# Patient Record
Sex: Female | Born: 1955 | Race: Black or African American | Hispanic: No | Smoking: Current some day smoker
Health system: Southern US, Community
[De-identification: ages and names within clinical notes are randomized; demographics above are authoritative.]

## PROBLEM LIST (undated history)

## (undated) DIAGNOSIS — M25569 Pain in unspecified knee: Secondary | ICD-10-CM

## (undated) DIAGNOSIS — E669 Obesity, unspecified: Secondary | ICD-10-CM

## (undated) DIAGNOSIS — M549 Dorsalgia, unspecified: Secondary | ICD-10-CM

## (undated) DIAGNOSIS — Z972 Presence of dental prosthetic device (complete) (partial): Secondary | ICD-10-CM

## (undated) DIAGNOSIS — G43909 Migraine, unspecified, not intractable, without status migrainosus: Secondary | ICD-10-CM

## (undated) DIAGNOSIS — R0683 Snoring: Secondary | ICD-10-CM

## (undated) DIAGNOSIS — E119 Type 2 diabetes mellitus without complications: Secondary | ICD-10-CM

## (undated) DIAGNOSIS — E1169 Type 2 diabetes mellitus with other specified complication: Secondary | ICD-10-CM

## (undated) DIAGNOSIS — I8390 Asymptomatic varicose veins of unspecified lower extremity: Secondary | ICD-10-CM

## (undated) DIAGNOSIS — E785 Hyperlipidemia, unspecified: Secondary | ICD-10-CM

## (undated) DIAGNOSIS — M170 Bilateral primary osteoarthritis of knee: Secondary | ICD-10-CM

## (undated) DIAGNOSIS — R7303 Prediabetes: Secondary | ICD-10-CM

## (undated) DIAGNOSIS — Z973 Presence of spectacles and contact lenses: Secondary | ICD-10-CM

## (undated) DIAGNOSIS — F419 Anxiety disorder, unspecified: Secondary | ICD-10-CM

## (undated) DIAGNOSIS — I10 Essential (primary) hypertension: Secondary | ICD-10-CM

## (undated) DIAGNOSIS — F32A Depression, unspecified: Secondary | ICD-10-CM

## (undated) HISTORY — DX: Bilateral primary osteoarthritis of knee: M17.0

## (undated) HISTORY — DX: Type 2 diabetes mellitus with other specified complication: E66.9

## (undated) HISTORY — DX: Hyperlipidemia, unspecified: E78.5

## (undated) HISTORY — DX: Essential (primary) hypertension: I10

## (undated) HISTORY — DX: Anxiety disorder, unspecified: F41.9

## (undated) HISTORY — DX: Snoring: R06.83

## (undated) HISTORY — DX: Asymptomatic varicose veins of unspecified lower extremity: I83.90

## (undated) HISTORY — DX: Pain in unspecified knee: M25.569

## (undated) HISTORY — DX: Depression, unspecified: F32.A

## (undated) HISTORY — DX: Type 2 diabetes mellitus with other specified complication: E11.69

## (undated) HISTORY — DX: Dorsalgia, unspecified: M54.9

## (undated) HISTORY — DX: Migraine, unspecified, not intractable, without status migrainosus: G43.909

## (undated) HISTORY — PX: FRACTURE SURGERY: SHX138

## (undated) HISTORY — PX: TUBAL LIGATION: SHX77

## (undated) HISTORY — PX: COLONOSCOPY: SHX174

## (undated) HISTORY — DX: Type 2 diabetes mellitus without complications: E11.9

## (undated) HISTORY — PX: DILATION AND CURETTAGE OF UTERUS: SHX78

## (undated) NOTE — *Deleted (*Deleted)
It was great to see you again today!  

---

## 1998-06-27 HISTORY — PX: FRACTURE SURGERY: SHX138

## 2003-04-22 ENCOUNTER — Ambulatory Visit (HOSPITAL_COMMUNITY): Admission: RE | Admit: 2003-04-22 | Discharge: 2003-04-22 | Payer: Self-pay

## 2005-08-02 ENCOUNTER — Ambulatory Visit (HOSPITAL_COMMUNITY): Admission: RE | Admit: 2005-08-02 | Discharge: 2005-08-02 | Payer: Self-pay | Admitting: Obstetrics

## 2007-10-25 ENCOUNTER — Ambulatory Visit (HOSPITAL_COMMUNITY): Admission: RE | Admit: 2007-10-25 | Discharge: 2007-10-25 | Payer: Self-pay | Admitting: Obstetrics

## 2008-06-27 DIAGNOSIS — I10 Essential (primary) hypertension: Secondary | ICD-10-CM | POA: Insufficient documentation

## 2008-06-27 HISTORY — DX: Essential (primary) hypertension: I10

## 2008-07-03 ENCOUNTER — Encounter: Admission: RE | Admit: 2008-07-03 | Discharge: 2008-07-03 | Payer: Self-pay | Admitting: Family Medicine

## 2008-07-22 ENCOUNTER — Encounter: Admission: RE | Admit: 2008-07-22 | Discharge: 2008-07-22 | Payer: Self-pay | Admitting: Family Medicine

## 2009-04-27 ENCOUNTER — Encounter: Admission: RE | Admit: 2009-04-27 | Discharge: 2009-06-24 | Payer: Self-pay | Admitting: Family Medicine

## 2009-08-06 ENCOUNTER — Ambulatory Visit (HOSPITAL_COMMUNITY): Admission: RE | Admit: 2009-08-06 | Discharge: 2009-08-06 | Payer: Self-pay | Admitting: Obstetrics

## 2010-06-23 ENCOUNTER — Ambulatory Visit (HOSPITAL_COMMUNITY)
Admission: RE | Admit: 2010-06-23 | Discharge: 2010-06-23 | Payer: Self-pay | Source: Home / Self Care | Attending: Obstetrics | Admitting: Obstetrics

## 2010-07-17 ENCOUNTER — Encounter: Payer: Self-pay | Admitting: Obstetrics

## 2010-07-18 ENCOUNTER — Encounter: Payer: Self-pay | Admitting: Obstetrics

## 2010-08-06 ENCOUNTER — Other Ambulatory Visit (HOSPITAL_COMMUNITY): Payer: Self-pay

## 2010-08-25 ENCOUNTER — Encounter (HOSPITAL_COMMUNITY)
Admission: RE | Admit: 2010-08-25 | Discharge: 2010-08-25 | Disposition: A | Discharge status: Home / Self Care | Payer: BC Managed Care – PPO | Source: Ambulatory Visit | Attending: Obstetrics | Admitting: Obstetrics

## 2010-08-25 LAB — CBC
HCT: 41.3 % (ref 36.0–46.0)
Hemoglobin: 13.6 g/dL (ref 12.0–15.0)
MCH: 27.8 pg (ref 26.0–34.0)
MCHC: 32.9 g/dL (ref 30.0–36.0)
MCV: 84.3 fL (ref 78.0–100.0)
Platelets: 347 10*3/uL (ref 150–400)
RBC: 4.9 MIL/uL (ref 3.87–5.11)
RDW: 14.5 % (ref 11.5–15.5)
WBC: 9.9 10*3/uL (ref 4.0–10.5)

## 2010-08-25 LAB — BASIC METABOLIC PANEL
BUN: 11 mg/dL (ref 6–23)
CO2: 28 mEq/L (ref 19–32)
Calcium: 9.5 mg/dL (ref 8.4–10.5)
Chloride: 103 mEq/L (ref 96–112)
Creatinine, Ser: 0.75 mg/dL (ref 0.4–1.2)
GFR calc Af Amer: >60 mL/min (ref >60)
GFR calc non Af Amer: >60 mL/min (ref >60)
Glucose, Bld: 95 mg/dL (ref 70–99)
Potassium: 3.9 mEq/L (ref 3.5–5.1)
Sodium: 137 mEq/L (ref 135–145)

## 2010-08-26 HISTORY — PX: OTHER SURGICAL HISTORY: SHX169

## 2010-08-31 ENCOUNTER — Ambulatory Visit (HOSPITAL_COMMUNITY)
Admission: RE | Admit: 2010-08-31 | Discharge: 2010-08-31 | Disposition: A | Discharge status: Home / Self Care | Payer: BC Managed Care – PPO | Source: Ambulatory Visit | Attending: Obstetrics | Admitting: Obstetrics

## 2010-08-31 ENCOUNTER — Other Ambulatory Visit: Payer: Self-pay | Admitting: Obstetrics

## 2010-08-31 DIAGNOSIS — Z01810 Encounter for preprocedural cardiovascular examination: Secondary | ICD-10-CM | POA: Insufficient documentation

## 2010-08-31 DIAGNOSIS — R9431 Abnormal electrocardiogram [ECG] [EKG]: Secondary | ICD-10-CM | POA: Insufficient documentation

## 2010-08-31 DIAGNOSIS — N938 Other specified abnormal uterine and vaginal bleeding: Secondary | ICD-10-CM | POA: Insufficient documentation

## 2010-08-31 DIAGNOSIS — N949 Unspecified condition associated with female genital organs and menstrual cycle: Secondary | ICD-10-CM | POA: Insufficient documentation

## 2010-08-31 DIAGNOSIS — Z01812 Encounter for preprocedural laboratory examination: Secondary | ICD-10-CM | POA: Insufficient documentation

## 2010-08-31 DIAGNOSIS — E119 Type 2 diabetes mellitus without complications: Secondary | ICD-10-CM | POA: Insufficient documentation

## 2010-09-02 NOTE — Op Note (Signed)
  [name], Misty Bell           ACCOUNT NO.:  [id number]  MEDICAL RECORD NO.:  [id number]           PATIENT TYPE:  O  LOCATION:  [facility]                          [facility]  PHYSICIAN:  Kathreen Cosier, M.D.DATE OF BIRTH:  1955-08-06  DATE OF PROCEDURE:  08/31/2010 DATE OF DISCHARGE:                              OPERATIVE REPORT   PREOPERATIVE DIAGNOSIS:  Dysfunctional uterine bleeding.  POSTOPERATIVE DIAGNOSIS:  Dysfunctional uterine bleeding.  PROCEDURES:  Hysteroscopy, dilation and curettage, NovaSure ablation.  Under general anesthesia, the patient in lithotomy position, perineum and vagina prepped and draped.  Bladder emptied with straight catheter. Bimanual exam revealed uterus to be normal size.  Negative adnexa. Speculum placed in the vagina.  Cervix injected with 10 mL of 1% Xylocaine.  Anterior lip of the cervix grasped with tenaculum.  The endometrial cavity sounded at 11 cm.  Cervix length was measured at 6 cm.  Cavity length was 5 cm.  She had endocervical polyp which was removed.  Cervix was dilated with #25 Shawnie Pons.  Hysteroscope inserted. She had multiple polyps in the cavity.  Hysteroscope removed.  Sharp curettage done.  Large amount of tissue obtained.  The NovaSure device inserted in the cavity, integrity test passed.  Cavity width was 5 cm. Ablation was performed for 110 seconds at 140 watts.  Postablation hysteroscopy, total ablation of the cavity.  Procedure terminated.  The patient tolerated the procedure well, taken to recovery room in good condition.          ______________________________ Kathreen Cosier, M.D.     BAM/MEDQ  D:  08/31/2010  T:  08/31/2010  Job:  [id number]  Electronically Signed by Francoise Ceo M.D. on 09/01/2010 09:44:24 AM

## 2011-06-07 ENCOUNTER — Other Ambulatory Visit (HOSPITAL_COMMUNITY): Payer: Self-pay | Admitting: Obstetrics

## 2011-06-07 DIAGNOSIS — Z1231 Encounter for screening mammogram for malignant neoplasm of breast: Secondary | ICD-10-CM

## 2011-07-13 ENCOUNTER — Ambulatory Visit (HOSPITAL_COMMUNITY)
Admission: RE | Admit: 2011-07-13 | Discharge: 2011-07-13 | Disposition: A | Discharge status: Home / Self Care | Payer: BC Managed Care – PPO | Source: Ambulatory Visit | Attending: Obstetrics | Admitting: Obstetrics

## 2011-07-13 ENCOUNTER — Ambulatory Visit (HOSPITAL_COMMUNITY): Payer: BC Managed Care – PPO

## 2011-07-13 DIAGNOSIS — Z1231 Encounter for screening mammogram for malignant neoplasm of breast: Secondary | ICD-10-CM | POA: Insufficient documentation

## 2011-07-20 ENCOUNTER — Other Ambulatory Visit: Payer: Self-pay | Admitting: Obstetrics

## 2011-07-20 DIAGNOSIS — R928 Other abnormal and inconclusive findings on diagnostic imaging of breast: Secondary | ICD-10-CM

## 2011-07-27 ENCOUNTER — Ambulatory Visit
Admission: RE | Admit: 2011-07-27 | Discharge: 2011-07-27 | Disposition: A | Discharge status: Home / Self Care | Payer: BC Managed Care – PPO | Source: Ambulatory Visit | Attending: Obstetrics | Admitting: Obstetrics

## 2011-07-27 DIAGNOSIS — R928 Other abnormal and inconclusive findings on diagnostic imaging of breast: Secondary | ICD-10-CM

## 2011-08-03 ENCOUNTER — Encounter: Payer: Self-pay | Admitting: Gastroenterology

## 2011-08-15 ENCOUNTER — Ambulatory Visit (AMBULATORY_SURGERY_CENTER): Payer: BC Managed Care – PPO

## 2011-08-15 VITALS — Ht 63.0 in | Wt 230.0 lb

## 2011-08-15 DIAGNOSIS — Z1211 Encounter for screening for malignant neoplasm of colon: Secondary | ICD-10-CM

## 2011-08-15 MED ORDER — PEG-KCL-NACL-NASULF-NA ASC-C 100 G PO SOLR: 1.0000 | Freq: Once | ORAL | Status: DC

## 2011-08-25 ENCOUNTER — Ambulatory Visit (AMBULATORY_SURGERY_CENTER): Payer: BC Managed Care – PPO | Admitting: Gastroenterology

## 2011-08-25 ENCOUNTER — Encounter: Payer: Self-pay | Admitting: Gastroenterology

## 2011-08-25 VITALS — BP 119/84 | HR 89 | Temp 97.9°F | Resp 20 | Ht 63.0 in | Wt 230.0 lb

## 2011-08-25 DIAGNOSIS — D126 Benign neoplasm of colon, unspecified: Secondary | ICD-10-CM

## 2011-08-25 DIAGNOSIS — Z1211 Encounter for screening for malignant neoplasm of colon: Secondary | ICD-10-CM

## 2011-08-25 MED ORDER — SODIUM CHLORIDE 0.9 % IV SOLN: 500.0000 mL | INTRAVENOUS | Status: DC

## 2011-08-25 NOTE — Op Note (Signed)
[facility] [address] [place], [place]  [address]  COLONOSCOPY PROCEDURE REPORT  PATIENT:  Misty Bell, Misty Bell  MR#:  [id number] BIRTHDATE:  [birth year], 55 yrs. old  GENDER:  female ENDOSCOPIST:  Barbette Hair. Arlyce Dice, MD REF. BY:  Clyda Greener, M.D. PROCEDURE DATE:  08/25/2011 PROCEDURE:  Colonoscopy with snare polypectomy, Colon with cold biopsy polypectomy ASA CLASS:  Class II INDICATIONS:  Routine Risk Screening MEDICATIONS:   MAC sedation, administered by CRNA propofol 300mg IV  DESCRIPTION OF PROCEDURE:   After the risks benefits and alternatives of the procedure were thoroughly explained, informed consent was obtained.  Digital rectal exam was performed and revealed no abnormalities.   The LB 180AL [id number] endoscope was introduced through the anus and advanced to the cecum, which was identified by both the appendix and ileocecal valve, without limitations.  The quality of the prep was excellent, using MoviPrep.  The instrument was then slowly withdrawn as the colon was fully examined. <<PROCEDUREIMAGES>>  FINDINGS:  A sessile polyp was found in the distal transverse colon. It was 10 mm in size. Polyp was snared without cautery. Retrieval was successful (see image4). snare polyp  A sessile polyp was found in the descending colon. It was 4 mm in size. Polyp was snared without cautery. Retrieval was successful (see image6). snare polyp  A diminutive polyp was found in the sigmoid colon. It was 2 mm in size. It was found 26 cm from the point of entry. The polyp was removed using cold biopsy forceps (see image7).  This was otherwise a normal examination of the colon (see image2 and image8).   Retroflexed views in the rectum revealed no abnormalities.    The time to cecum =  1) 4.75 minutes. The scope was then withdrawn in  1) 12.0  minutes from the cecum and the procedure completed. COMPLICATIONS:  None ENDOSCOPIC IMPRESSION: 1) 10 mm sessile polyp in the distal  transverse colon 2) 4 mm sessile polyp in the descending colon 3) 2 mm diminutive polyp in the sigmoid colon 4) Otherwise normal examination RECOMMENDATIONS: 1) If the polyp(s) removed today are proven to be adenomatous (pre-cancerous) polyps, you will need a repeat colonoscopy in 5 years. Otherwise you should continue to follow colorectal cancer screening guidelines for "routine risk" patients with colonoscopy in 10 years. You will receive a letter within 1-2 weeks with the results of your biopsy as well as final recommendations. Please call my office if you have not received a letter after 3 weeks. REPEAT EXAM:  You will receive a letter from Dr. Arlyce Dice in 1-2 weeks, after reviewing the final pathology, with followup recommendations.  ______________________________ Barbette Hair Arlyce Dice, MD  CC:  n. eSIGNED:   Barbette Hair. Keane Martelli at 08/25/2011 11:09 AM  [place], [id number]

## 2011-08-25 NOTE — Patient Instructions (Signed)
Discharge instructions given with verbal understanding. Handout on polyps given. Resume previous medications. YOU HAD AN ENDOSCOPIC PROCEDURE TODAY AT THE [facility]: Refer to the procedure report that was given to you for any specific questions about what was found during the examination.  If the procedure report does not answer your questions, please call your gastroenterologist to clarify.  If you requested that your care partner not be given the details of your procedure findings, then the procedure report has been included in a sealed envelope for you to review at your convenience later.  YOU SHOULD EXPECT: Some feelings of bloating in the abdomen. Passage of more gas than usual.  Walking can help get rid of the air that was put into your GI tract during the procedure and reduce the bloating. If you had a lower endoscopy (such as a colonoscopy or flexible sigmoidoscopy) you may notice spotting of blood in your stool or on the toilet paper. If you underwent a bowel prep for your procedure, then you may not have a normal bowel movement for a few days.  DIET: Your first meal following the procedure should be a light meal and then it is ok to progress to your normal diet.  A half-sandwich or bowl of soup is an example of a good first meal.  Heavy or fried foods are harder to digest and may make you feel nauseous or bloated.  Likewise meals heavy in dairy and vegetables can cause extra gas to form and this can also increase the bloating.  Drink plenty of fluids but you should avoid alcoholic beverages for 24 hours.  ACTIVITY: Your care partner should take you home directly after the procedure.  You should plan to take it easy, moving slowly for the rest of the day.  You can resume normal activity the day after the procedure however you should NOT DRIVE or use heavy machinery for 24 hours (because of the sedation medicines used during the test).    SYMPTOMS TO REPORT IMMEDIATELY: A  gastroenterologist can be reached at any hour.  During normal business hours, 8:30 AM to 5:00 PM Monday through Friday, call [phone].  After hours and on weekends, please call the GI answering service at [phone] who will take a message and have the physician on call contact you.   Following lower endoscopy (colonoscopy or flexible sigmoidoscopy):  Excessive amounts of blood in the stool  Significant tenderness or worsening of abdominal pains  Swelling of the abdomen that is new, acute  Fever of 100F or higher  FOLLOW UP: If any biopsies were taken you will be contacted by phone or by letter within the next 1-3 weeks.  Call your gastroenterologist if you have not heard about the biopsies in 3 weeks.  Our staff will call the home number listed on your records the next business day following your procedure to check on you and address any questions or concerns that you may have at that time regarding the information given to you following your procedure. This is a courtesy call and so if there is no answer at the home number and we have not heard from you through the emergency physician on call, we will assume that you have returned to your regular daily activities without incident.  SIGNATURES/CONFIDENTIALITY: You and/or your care partner have signed paperwork which will be entered into your electronic medical record.  These signatures attest to the fact that that the information above on your After Visit Summary has   been reviewed and is understood.  Full responsibility of the confidentiality of this discharge information lies with you and/or your care-partner. 

## 2011-08-25 NOTE — Progress Notes (Signed)
Patient did not experience any of the following events: a burn prior to discharge; a fall within the facility; wrong site/side/patient/procedure/implant event; or a hospital transfer or hospital admission upon discharge from the facility. (G8907) Patient did not have preoperative order for IV antibiotic SSI prophylaxis. (G8918)  

## 2011-08-26 ENCOUNTER — Telehealth: Payer: Self-pay | Admitting: *Deleted

## 2011-08-26 NOTE — Telephone Encounter (Signed)
  Follow up Call-  Call back number 08/25/2011  Post procedure Call Back phone  # [phone]  Permission to leave phone message Yes     [facility]

## 2011-08-30 ENCOUNTER — Encounter: Payer: Self-pay | Admitting: Gastroenterology

## 2012-05-07 ENCOUNTER — Other Ambulatory Visit: Payer: Self-pay | Admitting: Family Medicine

## 2012-05-07 DIAGNOSIS — R52 Pain, unspecified: Secondary | ICD-10-CM

## 2012-05-07 DIAGNOSIS — R609 Edema, unspecified: Secondary | ICD-10-CM

## 2012-05-08 ENCOUNTER — Ambulatory Visit
Admission: RE | Admit: 2012-05-08 | Discharge: 2012-05-08 | Disposition: A | Discharge status: Home / Self Care | Payer: BC Managed Care – PPO | Source: Ambulatory Visit | Attending: Family Medicine | Admitting: Family Medicine

## 2012-05-08 DIAGNOSIS — R609 Edema, unspecified: Secondary | ICD-10-CM

## 2012-05-08 DIAGNOSIS — R52 Pain, unspecified: Secondary | ICD-10-CM

## 2012-09-24 ENCOUNTER — Other Ambulatory Visit (HOSPITAL_COMMUNITY): Payer: Self-pay | Admitting: Family Medicine

## 2012-09-24 DIAGNOSIS — Z1231 Encounter for screening mammogram for malignant neoplasm of breast: Secondary | ICD-10-CM

## 2012-09-27 ENCOUNTER — Ambulatory Visit (HOSPITAL_COMMUNITY)
Admission: RE | Admit: 2012-09-27 | Discharge: 2012-09-27 | Disposition: A | Discharge status: Home / Self Care | Payer: BC Managed Care – PPO | Source: Ambulatory Visit | Attending: Family Medicine | Admitting: Family Medicine

## 2012-09-27 DIAGNOSIS — Z1231 Encounter for screening mammogram for malignant neoplasm of breast: Secondary | ICD-10-CM | POA: Insufficient documentation

## 2012-10-04 ENCOUNTER — Other Ambulatory Visit: Payer: Self-pay | Admitting: *Deleted

## 2012-10-04 DIAGNOSIS — I83893 Varicose veins of bilateral lower extremities with other complications: Secondary | ICD-10-CM

## 2012-10-26 ENCOUNTER — Encounter: Payer: Self-pay | Admitting: Vascular Surgery

## 2012-10-26 ENCOUNTER — Other Ambulatory Visit: Payer: Self-pay | Admitting: Orthopedic Surgery

## 2012-10-26 DIAGNOSIS — M79604 Pain in right leg: Secondary | ICD-10-CM

## 2012-10-26 DIAGNOSIS — R609 Edema, unspecified: Secondary | ICD-10-CM

## 2012-10-29 ENCOUNTER — Encounter (INDEPENDENT_AMBULATORY_CARE_PROVIDER_SITE_OTHER): Payer: BC Managed Care – PPO | Admitting: *Deleted

## 2012-10-29 ENCOUNTER — Ambulatory Visit (INDEPENDENT_AMBULATORY_CARE_PROVIDER_SITE_OTHER): Payer: BC Managed Care – PPO | Admitting: Vascular Surgery

## 2012-10-29 ENCOUNTER — Ambulatory Visit
Admission: RE | Admit: 2012-10-29 | Discharge: 2012-10-29 | Disposition: A | Discharge status: Home / Self Care | Payer: BC Managed Care – PPO | Source: Ambulatory Visit | Attending: Orthopedic Surgery | Admitting: Orthopedic Surgery

## 2012-10-29 ENCOUNTER — Encounter: Payer: Self-pay | Admitting: Vascular Surgery

## 2012-10-29 VITALS — BP 133/88 | HR 103 | Resp 18 | Ht 63.0 in | Wt 234.0 lb

## 2012-10-29 DIAGNOSIS — I781 Nevus, non-neoplastic: Secondary | ICD-10-CM

## 2012-10-29 DIAGNOSIS — R609 Edema, unspecified: Secondary | ICD-10-CM

## 2012-10-29 DIAGNOSIS — I83893 Varicose veins of bilateral lower extremities with other complications: Secondary | ICD-10-CM

## 2012-10-29 DIAGNOSIS — M79604 Pain in right leg: Secondary | ICD-10-CM

## 2012-10-29 DIAGNOSIS — M79605 Pain in left leg: Secondary | ICD-10-CM

## 2012-10-29 HISTORY — DX: Nevus, non-neoplastic: I78.1

## 2012-10-29 NOTE — Progress Notes (Signed)
Subjective:     Patient ID: Misty Bell, female   DOB: [birth year], 57 y.o.   MRN: [id number]  HPI this 57 year old female is evaluated for varicose veins in the right leg. She has noted some prominent veins in the lateral aspect of her right calf near the knee area which has become more symptomatic over the past few years. She does develop swelling in both ankles and feet as the day progresses. Her work as a sitting job and she does not wear elastic compression stockings are elevated her legs much during the day. She has no history of DVT or thrombophlebitis or stasis ulcers or bleeding. She does have some hypersensitivity and itching associated with these prominent veins in the right leg.  Past Medical History  Diagnosis Date  . Arthritis     bilateral knees  . Hypertension     2010  . Varicose veins     History  Substance Use Topics  . Smoking status: Current Some Day Smoker    Types: Cigarettes  . Smokeless tobacco: Never Used  . Alcohol Use: Yes    Family History  Problem Relation Age of Onset  . Colon cancer Neg Hx   . Heart disease Mother     No Known Allergies  Current outpatient prescriptions:amLODipine (NORVASC) 10 MG tablet, Take 10 mg by mouth daily., Disp: , Rfl: ;  furosemide (LASIX) 10 MG/ML solution, Take by mouth daily., Disp: , Rfl: ;  meloxicam (MOBIC) 15 MG tablet, Take 15 mg by mouth daily., Disp: , Rfl: ;  potassium chloride SA (K-DUR,KLOR-CON) 20 MEQ tablet, Take 20 mEq by mouth 2 (two) times daily., Disp: , Rfl: ;  PredniSONE 5 MG KIT, Take 5 mg by mouth., Disp: , Rfl:   BP 133/88  Pulse 103  Resp 18  Ht 5\' 3"  (1.6 m)  Wt 234 lb (106.142 kg)  BMI 41.46 kg/m2  Body mass index is 41.46 kg/(m^2).           Review of Systems denies chest pain but does have dyspnea on exertion. Does not ambulate long distances because of fatigue. Complains of weakness and numbness in the arms and legs nonspecific. Other systems are negative    Objective:   Physical Exam blood pressure 133 radiate heart rate 103 respirations 18 Gen.-alert and oriented x3 in no apparent distress-obese  HEENT normal for age Lungs no rhonchi or wheezing Cardiovascular regular rhythm no murmurs carotid pulses 3+ palpable no bruits audible Abdomen soft nontender no palpable masses Musculoskeletal free of  major deformities Skin clear -no rashes Neurologic normal Lower extremities 3+ femoral and dorsalis pedis pulses palpable bilaterally with 1+ edema bilaterally Small patch of reticular and spider veins in the popliteal fossa laterally on the right leg distal thigh area over about a 4 cm area of circumference. No bulging varicosities noted. No hyperpigmentation or ulceration noted.  Stab ordered a venous duplex exam the right leg which are reviewed and interpreted. There is no DVT. There is no reflux in the right great or small saphenous veins. There is mild reflux in the common femoral vein on the right.      Assessment:     Symptomatic reticular and spider veins right posterior thigh calf area laterally with no superficial venous reflux demonstrated    Plan:     Treatment would consist of sclerotherapy if patient should decide to proceed

## 2012-12-24 ENCOUNTER — Encounter: Payer: BC Managed Care – PPO | Admitting: Vascular Surgery

## 2014-01-24 ENCOUNTER — Other Ambulatory Visit: Payer: Self-pay

## 2014-03-13 ENCOUNTER — Ambulatory Visit (INDEPENDENT_AMBULATORY_CARE_PROVIDER_SITE_OTHER): Payer: BC Managed Care – PPO | Admitting: Surgery

## 2014-04-03 ENCOUNTER — Other Ambulatory Visit (HOSPITAL_COMMUNITY): Payer: Self-pay | Admitting: Obstetrics

## 2014-04-03 DIAGNOSIS — Z1231 Encounter for screening mammogram for malignant neoplasm of breast: Secondary | ICD-10-CM

## 2014-04-08 ENCOUNTER — Ambulatory Visit (HOSPITAL_COMMUNITY)
Admission: RE | Admit: 2014-04-08 | Discharge: 2014-04-08 | Disposition: A | Discharge status: Home / Self Care | Payer: BC Managed Care – PPO | Source: Ambulatory Visit | Attending: Obstetrics | Admitting: Obstetrics

## 2014-04-08 DIAGNOSIS — Z1231 Encounter for screening mammogram for malignant neoplasm of breast: Secondary | ICD-10-CM | POA: Diagnosis present

## 2014-08-11 ENCOUNTER — Institutional Professional Consult (permissible substitution): Payer: BC Managed Care – PPO | Admitting: Internal Medicine

## 2015-01-01 ENCOUNTER — Other Ambulatory Visit: Payer: Self-pay

## 2015-03-13 ENCOUNTER — Other Ambulatory Visit: Payer: Self-pay | Admitting: General Surgery

## 2015-03-18 ENCOUNTER — Encounter: Payer: BC Managed Care – PPO | Admitting: Physician Assistant

## 2015-03-18 NOTE — Progress Notes (Deleted)
Cardiology Office Note Date:  03/18/2015  Patient ID:  Misty Bell, DOB 08-26-1955, MRN [id number] PCP:  Elizabeth Palau, MD  Cardiologist:  New to Dr. Meda Coffee  ***refresh   Chief Complaint: pre-op clearance  History of Present Illness: Misty Bell is a 59 y.o. female with history of essential HTN, varicose veins, arthritis, *** and morbid obesity who is here to discuss pre-operative evaluation for laparoscopic vertical sleeve gastrectomy.  Needs pmh, surg hx, social hx, family hx filled in Visit dx if needed A/p   Past Medical History  Diagnosis Date  . Arthritis     bilateral knees  . Hypertension     2010  . Varicose veins     Past Surgical History  Procedure Laterality Date  . Fracture surgery      neck  . Uterine ablation      March 2012    Current Outpatient Prescriptions  Medication Sig Dispense Refill  . amLODipine (NORVASC) 10 MG tablet Take 10 mg by mouth daily.    . furosemide (LASIX) 10 MG/ML solution Take by mouth daily.    . meloxicam (MOBIC) 15 MG tablet Take 15 mg by mouth daily.    . potassium chloride SA (K-DUR,KLOR-CON) 20 MEQ tablet Take 20 mEq by mouth 2 (two) times daily.    . PredniSONE 5 MG KIT Take 5 mg by mouth.     No current facility-administered medications for this visit.    Allergies:   Review of patient's allergies indicates no known allergies.   Social History:  The patient  reports that she has been smoking Cigarettes.  She has never used smokeless tobacco. She reports that she drinks alcohol. She reports that she does not use illicit drugs.   Family History:  The patient's family history includes Heart disease in her mother. There is no history of Colon cancer.***  ROS:  Please see the history of present illness. Otherwise, review of systems is positive for ***.   All other systems are reviewed and otherwise negative.   PHYSICAL EXAM: *** VS:  There were no vitals taken for this visit. BMI: There is no  weight on file to calculate BMI. Well nourished, well developed, in no acute distress HEENT: normocephalic, atraumatic Neck: no JVD, carotid bruits or masses Cardiac:  normal S1, S2; RRR; no murmurs, rubs, or gallops Lungs:  clear to auscultation bilaterally, no wheezing, rhonchi or rales Abd: soft, nontender, no hepatomegaly, + BS MS: no deformity or atrophy Ext: no edema Skin: warm and dry, no rash Neuro:  moves all extremities spontaneously, no focal abnormalities noted, follows commands Psych: euthymic mood, full affect   EKG:  Done today shows ***  Recent Labs: No results found for requested labs within last 365 days.  No results found for requested labs within last 365 days.   CrCl cannot be calculated (Unknown ideal weight.).   Wt Readings from Last 3 Encounters:  10/29/12 234 lb (106.142 kg)  08/25/11 230 lb (104.327 kg)  08/15/11 230 lb (104.327 kg)     Other studies reviewed: Additional studies/records reviewed today include: summarized above***  ASSESSMENT AND PLAN:  1. Pre-operative cardiovascular exam 2. Morbid obesity 3. Essential hypertension  Disposition: F/u with ***  Current medicines are reviewed at length with the patient today.  The patient did not have any concerns regarding medicines.***  Signed, Melina Copa PA-C 03/18/2015 9:40 AM     [place] [address] [place] [address] [phone] (  office)  [phone] (fax)

## 2015-03-18 NOTE — Progress Notes (Signed)
This encounter was created in error - please disregard.

## 2015-03-30 ENCOUNTER — Other Ambulatory Visit: Payer: Self-pay | Admitting: General Surgery

## 2015-04-01 ENCOUNTER — Encounter: Payer: Self-pay | Admitting: Cardiology

## 2015-04-01 DIAGNOSIS — F419 Anxiety disorder, unspecified: Secondary | ICD-10-CM | POA: Insufficient documentation

## 2015-04-01 DIAGNOSIS — E669 Obesity, unspecified: Secondary | ICD-10-CM | POA: Insufficient documentation

## 2015-04-01 DIAGNOSIS — E1169 Type 2 diabetes mellitus with other specified complication: Secondary | ICD-10-CM

## 2015-04-01 DIAGNOSIS — M549 Dorsalgia, unspecified: Secondary | ICD-10-CM | POA: Insufficient documentation

## 2015-04-01 DIAGNOSIS — G43909 Migraine, unspecified, not intractable, without status migrainosus: Secondary | ICD-10-CM | POA: Insufficient documentation

## 2015-04-01 DIAGNOSIS — I10 Essential (primary) hypertension: Secondary | ICD-10-CM | POA: Insufficient documentation

## 2015-04-01 HISTORY — DX: Essential (primary) hypertension: I10

## 2015-04-01 HISTORY — DX: Type 2 diabetes mellitus with other specified complication: E11.69

## 2015-04-06 ENCOUNTER — Ambulatory Visit (INDEPENDENT_AMBULATORY_CARE_PROVIDER_SITE_OTHER): Payer: BC Managed Care – PPO | Admitting: Cardiology

## 2015-04-06 ENCOUNTER — Encounter: Payer: Self-pay | Admitting: *Deleted

## 2015-04-06 VITALS — BP 126/82 | HR 103 | Ht 63.0 in | Wt 245.0 lb

## 2015-04-06 DIAGNOSIS — Z01810 Encounter for preprocedural cardiovascular examination: Secondary | ICD-10-CM | POA: Diagnosis not present

## 2015-04-06 DIAGNOSIS — R0609 Other forms of dyspnea: Secondary | ICD-10-CM

## 2015-04-06 DIAGNOSIS — E785 Hyperlipidemia, unspecified: Secondary | ICD-10-CM

## 2015-04-06 DIAGNOSIS — I10 Essential (primary) hypertension: Secondary | ICD-10-CM

## 2015-04-06 NOTE — Patient Instructions (Signed)
Medication Instructions:  Your physician recommends that you continue on your current medications as directed. Please refer to the Current Medication list given to you today.   Labwork: None ordered  Testing/Procedures: Your physician has requested that you have an echocardiogram. Echocardiography is a painless test that uses sound waves to create images of your heart. It provides your doctor with information about the size and shape of your heart and how well your heart's chambers and valves are working. This procedure takes approximately one hour. There are no restrictions for this procedure.  Your physician has requested that you have en exercise stress myoview. For further information please visit [email or web address]. Please follow instruction sheet, as given.     Follow-Up: Your physician wants you to follow-up in: 6 months with Dr.Nelson You will receive a reminder letter in the mail two months in advance. If you don't receive a letter, please call our office to schedule the follow-up appointment.   Any Other Special Instructions Will Be Listed Below (If Applicable).

## 2015-04-06 NOTE — Progress Notes (Signed)
Patient ID: Misty Bell, female   DOB: 31-Dec-1955, 59 y.o.   MRN: [id number]      Cardiology Office Note  Date:  04/06/2015   ID:  Misty Bell, DOB 02-08-1956, MRN [id number]  PCP:  Elizabeth Palau, MD  Cardiologist:  Dorothy Spark, MD   Chief complain: Preop evaluation for gastric sleeve surgery, DOE  History of Present Illness: 59 year old female with h/o obesity, treated hypertension, hyperlipidemia. She has sedentary job and doesn't exercise much. She has tried several ways of loosing weight and was unsuccessful, now decided to undergo a gastric sleeve surgery at [facility]. She has never smoked, denies chest pain, is postmenopausal. She has noticed worsening exercise capacity and worsening SOB on exertion in the last 3 years. No palpitations, syncope, mild on and off LE edema, Left > right, no claudications, or syncope. No orthopnea or PND.  Mother had MI at age 53 and passed away. GM had a CABG at age 68.   Past Medical History  Diagnosis Date  . Primary osteoarthritis of both knees   . Hypertension 2010  . Varicose veins   . Migraine   . Anxiety disorder   . Back pain   . Diabetes mellitus type 2 in obese ([place])   . Snoring     Past Surgical History  Procedure Laterality Date  . Fracture surgery      neck  . Uterine ablation  08/2010    Current outpatient prescriptions:  .  amLODipine (NORVASC) 10 MG tablet, Take 10 mg by mouth daily., Disp: , Rfl:  .  furosemide (LASIX) 10 MG/ML solution, Take by mouth daily., Disp: , Rfl:  .  potassium chloride SA (K-DUR,KLOR-CON) 20 MEQ tablet, Take 20 mEq by mouth 2 (two) times daily., Disp: , Rfl:  .  pravastatin (PRAVACHOL) 20 MG tablet, Take 20 mg by mouth daily., Disp: , Rfl: 0   Allergies:   Review of patient's allergies indicates no known allergies.    Social History:  The patient  reports that she has been smoking Cigarettes.  She has never used smokeless tobacco. She reports that she drinks alcohol. She  reports that she does not use illicit drugs.   Family History:  The patient's family history includes Cervical cancer in her mother; Diabetes in her other; Heart disease in her mother and other. There is no history of Colon cancer.   ROS:  Please see the history of present illness.   Otherwise, review of systems are positive for none.   All other systems are reviewed and negative.   PHYSICAL EXAM: VS:  BP 126/82 mmHg  Pulse 103  Ht 5\' 3"  (1.6 m)  Wt 245 lb (111.131 kg)  BMI 43.41 kg/m2 , BMI Body mass index is 43.41 kg/(m^2). GEN: Well nourished, well developed, in no acute distress HEENT: normal Neck: no JVD, carotid bruits, or masses Cardiac: RRR; no murmurs, rubs, or gallops, mild LE edema L>R Respiratory:  clear to auscultation bilaterally, normal work of breathing GI: soft, nontender, nondistended, + BS MS: no deformity or atrophy Skin: warm and dry, no rash Neuro:  Strength and sensation are intact Psych: euthymic mood, full affect  EKG:  EKG is ordered today. The ekg ordered today demonstrates Sinus tachycardia , possible LAE  Recent Labs: No results found for requested labs within last 365 days.   Lipid Panel No results found for: CHOL, TRIG, HDL, CHOLHDL, VLDL, LDLCALC, LDLDIRECT   Wt Readings from Last 3 Encounters:  04/06/15 245  lb (111.131 kg)  10/29/12 234 lb (106.142 kg)  08/25/11 230 lb (104.327 kg)      ASSESSMENT AND PLAN:  1.  DOE - RF include FH of prematuer CAD, postmenopause, HTN and HLP, IGT. We will schedule an echocardiogram to evaluate for a systolic and diastolic function and a nuclear stress test to rule out ischemia.  2. Hypertension - controlled  3. HLP - followed by PCP, on pravastatin  4. Preop evaluation - we will clear once we have the Echo and stress tests results back.  Follow up in 6 months.  Signed, Dorothy Spark, MD  04/06/2015 12:19 PM    [facility] [place] [place], [place]   [id number] Phone: [phone]; Fax: [phone]

## 2015-04-09 ENCOUNTER — Other Ambulatory Visit: Payer: Self-pay

## 2015-04-09 ENCOUNTER — Ambulatory Visit (HOSPITAL_COMMUNITY)
Admission: RE | Admit: 2015-04-09 | Discharge: 2015-04-09 | Disposition: A | Discharge status: Home / Self Care | Payer: BC Managed Care – PPO | Source: Ambulatory Visit | Attending: General Surgery | Admitting: General Surgery

## 2015-04-09 ENCOUNTER — Other Ambulatory Visit (HOSPITAL_COMMUNITY): Payer: BC Managed Care – PPO

## 2015-04-09 DIAGNOSIS — K449 Diaphragmatic hernia without obstruction or gangrene: Secondary | ICD-10-CM | POA: Insufficient documentation

## 2015-04-09 DIAGNOSIS — Z01818 Encounter for other preprocedural examination: Secondary | ICD-10-CM | POA: Insufficient documentation

## 2015-04-13 ENCOUNTER — Telehealth (HOSPITAL_COMMUNITY): Payer: Self-pay | Admitting: *Deleted

## 2015-04-13 NOTE — Telephone Encounter (Signed)
Left message on voicemail in reference to upcoming appointment scheduled for 04/15/15. Phone number given for a call back so details instructions can be given. Misty Bell, [name]

## 2015-04-14 ENCOUNTER — Telehealth (HOSPITAL_COMMUNITY): Payer: Self-pay

## 2015-04-14 NOTE — Telephone Encounter (Signed)
Patient given detailed instructions per Myocardial Perfusion Study Information Sheet for the test on 04-15-2015 at 1230. Patient notified to arrive at 1130 for Echo and that it is imperative to arrive on time for appointment to keep from having the test rescheduled.  If you need to cancel or reschedule your appointment, please call the office within 24 hours of your appointment. Failure to do so may result in a cancellation of your appointment, and a $50 no show fee. Patient verbalized understanding.Oletta Lamas, Brit Wernette A

## 2015-04-15 ENCOUNTER — Other Ambulatory Visit: Payer: Self-pay

## 2015-04-15 ENCOUNTER — Ambulatory Visit (HOSPITAL_COMMUNITY): Payer: BC Managed Care – PPO | Attending: Cardiology

## 2015-04-15 ENCOUNTER — Ambulatory Visit (HOSPITAL_BASED_OUTPATIENT_CLINIC_OR_DEPARTMENT_OTHER): Payer: BC Managed Care – PPO

## 2015-04-15 DIAGNOSIS — Z6841 Body Mass Index (BMI) 40.0 and over, adult: Secondary | ICD-10-CM | POA: Insufficient documentation

## 2015-04-15 DIAGNOSIS — Z01810 Encounter for preprocedural cardiovascular examination: Secondary | ICD-10-CM

## 2015-04-15 DIAGNOSIS — E785 Hyperlipidemia, unspecified: Secondary | ICD-10-CM | POA: Insufficient documentation

## 2015-04-15 DIAGNOSIS — E669 Obesity, unspecified: Secondary | ICD-10-CM | POA: Diagnosis not present

## 2015-04-15 DIAGNOSIS — I517 Cardiomegaly: Secondary | ICD-10-CM | POA: Diagnosis not present

## 2015-04-15 DIAGNOSIS — R0609 Other forms of dyspnea: Secondary | ICD-10-CM

## 2015-04-15 DIAGNOSIS — I10 Essential (primary) hypertension: Secondary | ICD-10-CM

## 2015-04-15 MED ORDER — TECHNETIUM TC 99M SESTAMIBI GENERIC - CARDIOLITE
30.7000 | Freq: Once | INTRAVENOUS | Status: AC | PRN
  Administered 2015-04-15: 30.7 via INTRAVENOUS

## 2015-04-16 ENCOUNTER — Ambulatory Visit (HOSPITAL_COMMUNITY): Payer: BC Managed Care – PPO | Attending: Cardiology

## 2015-04-16 DIAGNOSIS — R0989 Other specified symptoms and signs involving the circulatory and respiratory systems: Secondary | ICD-10-CM

## 2015-04-16 LAB — MYOCARDIAL PERFUSION IMAGING
Estimated workload: 7 METS
Exercise duration (min): 6 min
Exercise duration (sec): 0 s
LV dias vol: 80 mL
LV sys vol: 21 mL
MPHR: 162 {beats}/min
Peak HR: 162 {beats}/min
Percent HR: 99 %
RATE: 0.35
RPE: 18
Rest HR: 87 {beats}/min
SDS: 0
SRS: 0
SSS: 0
TID: 1.01

## 2015-04-16 MED ORDER — TECHNETIUM TC 99M SESTAMIBI GENERIC - CARDIOLITE
32.5000 | Freq: Once | INTRAVENOUS | Status: AC | PRN
  Administered 2015-04-16: 33 via INTRAVENOUS

## 2015-04-17 ENCOUNTER — Other Ambulatory Visit: Payer: Self-pay | Admitting: *Deleted

## 2015-04-17 MED ORDER — LISINOPRIL 5 MG PO TABS: 5.0000 mg | ORAL_TABLET | Freq: Every day | ORAL | Status: DC

## 2015-04-21 ENCOUNTER — Ambulatory Visit: Payer: BC Managed Care – PPO | Admitting: Dietician

## 2015-05-07 ENCOUNTER — Encounter: Payer: BC Managed Care – PPO | Attending: Family Medicine | Admitting: Dietician

## 2015-05-07 DIAGNOSIS — Z713 Dietary counseling and surveillance: Secondary | ICD-10-CM | POA: Diagnosis not present

## 2015-05-07 DIAGNOSIS — Z6841 Body Mass Index (BMI) 40.0 and over, adult: Secondary | ICD-10-CM | POA: Diagnosis not present

## 2015-05-07 NOTE — Progress Notes (Signed)
  Pre-Op Assessment Visit:  Pre-Operative Sleeve Gastrectomy Surgery  Medical Nutrition Therapy:  Appt start time: 1010   End time:  [id number].  Patient was seen on 05/07/2015 for Pre-Operative Nutrition Assessment. Assessment and letter of approval faxed to [facility] Bariatric Surgery Program coordinator on 05/07/2015.   Preferred Learning Style:   No preference indicated   Learning Readiness:   Ready  Handouts given during visit include:  Pre-Op Goals Bariatric Surgery Protein Shakes   During the appointment today the following Pre-Op Goals were reviewed with the patient: Maintain or lose weight as instructed by your surgeon Make healthy food choices Begin to limit portion sizes Limited concentrated sugars and fried foods Keep fat/sugar in the single digits per serving on   food labels Practice CHEWING your food  (aim for 30 chews per bite or until applesauce consistency) Practice not drinking 15 minutes before, during, and 30 minutes after each meal/snack Avoid all carbonated beverages  Avoid/limit caffeinated beverages  Avoid all sugar-sweetened beverages Consume 3 meals per day; eat every 3-5 hours Make a list of non-food related activities Aim for 64-100 ounces of FLUID daily  Aim for at least 60-80 grams of PROTEIN daily Look for a liquid protein source that contain ?15 g protein and ?5 g carbohydrate  (ex: shakes, drinks, shots)  Patient-Centered Goals:  Goals: feel better, become happier, find a new romantic relationship, improve body issues, increase longevity   10 level of confidence/10 level of importance scale 1-10  Demonstrated degree of understanding via:  Teach Back  Teaching Method Utilized:  Visual Auditory Hands on  Barriers to learning/adherence to lifestyle change: none  Patient to call the [facility] to enroll in Pre-Op and Post-Op Nutrition Education when surgery date is scheduled.

## 2015-05-07 NOTE — Patient Instructions (Signed)

## 2015-06-12 ENCOUNTER — Other Ambulatory Visit: Payer: Self-pay

## 2015-06-12 DIAGNOSIS — Z1231 Encounter for screening mammogram for malignant neoplasm of breast: Secondary | ICD-10-CM

## 2015-06-16 ENCOUNTER — Ambulatory Visit: Payer: BC Managed Care – PPO | Admitting: Cardiology

## 2015-06-16 ENCOUNTER — Ambulatory Visit
Admission: RE | Admit: 2015-06-16 | Discharge: 2015-06-16 | Disposition: A | Discharge status: Home / Self Care | Payer: BC Managed Care – PPO | Source: Ambulatory Visit

## 2015-06-16 DIAGNOSIS — Z1231 Encounter for screening mammogram for malignant neoplasm of breast: Secondary | ICD-10-CM

## 2015-06-24 LAB — CYTOLOGY - PAP: PAP SMEAR: NEGATIVE

## 2015-06-28 HISTORY — PX: OTHER SURGICAL HISTORY: SHX169

## 2015-07-29 ENCOUNTER — Ambulatory Visit: Payer: BC Managed Care – PPO | Admitting: Cardiology

## 2015-08-17 ENCOUNTER — Ambulatory Visit: Payer: Self-pay | Admitting: General Surgery

## 2015-08-20 ENCOUNTER — Other Ambulatory Visit (HOSPITAL_COMMUNITY): Payer: BC Managed Care – PPO

## 2015-08-27 ENCOUNTER — Ambulatory Visit: Payer: Self-pay | Admitting: General Surgery

## 2015-08-27 NOTE — H&P (Signed)
[place] 08/27/2015 4:13 PM Location: [facility] Patient #: [id number] DOB: 29-Apr-1956 Divorced / Language: English / Race: Black or African American Female  History of Present Illness Misty Hiss M. [name] MD; 08/27/2015 5:23 PM) The patient is a 60 year old female who presents for a pre-op visit. She comes in today for her preoperative visit. She has been scheduled and approved for laparoscopic sleeve gastrectomy with possible hiatal hernia repair. She denies any medical changes since she was last seen. However her primary care physician passed away and she is essentially out of her blood pressure medications. She denies any chest pain, chest pressure, shortness of breath, dyspnea on exertion, orthopnea or paroxysmal nocturnal dyspnea. She denies any TIAs or amaurosis fugax. She denies any reflux. She did see the cardiologist and was cleared for surgery. The cardiologist did put her on a statin medication. Her preoperative workup was not terribly surprising. Her upper GI showed a small sliding hiatal hernia. Her hemoglobin A1c was 7.26 she is no longer considered prediabetic. Her total cholesterol level was 293, triglyceride level 109, LDL level 209. Her comprehensive metabolic panel and CBC were within normal limits.   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 Lasix (10MG /ML Solution, Injection) Active. Potassium Chloride (20MEQ Tablet ER, Oral) Active. Medications Reconciled Lisinopril (5MG  Tablet, Oral) Active. Pravastatin Sodium (20MG  Tablet, Oral) Active. Potassium (75MG  Tablet, Oral) Active. [place]-[facility] [place] Packet, Oral) Active. OxyCODONE HCl (5MG /5ML Solution, 5-10 Milliliter Oral every four hours, as needed, Taken starting 08/27/2015) Active. Ondansetron (4MG  Tablet Disperse, 1 (one) Tablet Oral every six hours, as needed, Taken starting 08/27/2015) Active. Pantoprazole Sodium (40MG  Tablet DR, 1 (one) Tablet Oral daily, Taken starting 08/27/2015) Active.  Social History [name], MD; 08/27/2015 5:29 PM) Tobacco use Former smoker. Alcohol use Moderate alcohol use. Caffeine use Coffee, Tea. Illicit drug use Remotely quit drug use.  Family History [name], MD; 08/27/2015 5:29 PM) Cervical Cancer Mother. Diabetes Mellitus Family Members In General. Heart Disease Family Members In General, Mother. Heart disease in female family member before age 71  Pregnancy / Birth History [name], MD; 08/27/2015 5:29 PM) Durenda Age 4 Maternal age 84-20 Para 3 Contraceptive History Oral contraceptives. Age at menarche 72 years. Age of menopause 51-55 Regular periods     Review of Systems Misty Hiss M. Blain Hunsucker MD; 08/27/2015 5:23 PM) General Present- Fatigue, Night Sweats and Weight Gain. Not Present- Appetite Loss, Chills, Fever and Weight Loss. Skin Present- Dryness, New Lesions and Non-Healing Wounds. Not Present- Change in Wart/Mole, Hives, Jaundice, Rash and Ulcer. HEENT Present- Wears glasses/contact lenses. Not Present- Earache, Hearing Loss, Hoarseness, Nose Bleed, Oral Ulcers, Ringing in the Ears, Seasonal Allergies, Sinus Pain, Sore Throat, Visual  Disturbances and Yellow Eyes. Respiratory Present- Wheezing. Not Present- Bloody sputum, Chronic Cough, Difficulty Breathing and Snoring. Cardiovascular Present- Leg Cramps,  Shortness of Breath and Swelling of Extremities. Not Present- Chest Pain, Difficulty Breathing Lying Down, Palpitations and Rapid Heart Rate. Gastrointestinal Present- Bloating, Constipation and Gets full quickly at meals. Not Present- Abdominal Pain, Bloody Stool, Change in Bowel Habits, Chronic diarrhea, Difficulty Swallowing, Excessive gas, Hemorrhoids, Indigestion, Nausea, Rectal Pain and Vomiting. Female Genitourinary Present- Frequency, Nocturia and Urgency. Not Present- Painful Urination and Pelvic Pain. Musculoskeletal Present- Back Pain, Joint Pain, Joint Stiffness and Swelling of Extremities. Not Present- Muscle Pain and Muscle Weakness. Neurological Present- Numbness, Tingling, Trouble walking and Weakness. Not Present- Decreased Memory, Fainting, Headaches, Seizures and Tremor. Psychiatric Present- Change in Sleep Pattern. Not Present- Anxiety, Bipolar, Depression, Fearful and Frequent crying. Endocrine Present- Hot flashes. Not Present- Cold Intolerance, Excessive Hunger, Hair Changes, Heat Intolerance and New Diabetes. Hematology Present- Easy Bruising. Not Present- Excessive bleeding, Gland problems, HIV and Persistent Infections.  Vitals Elbert Ewings CMA; 08/27/2015 4:14 PM) 08/27/2015 4:13 PM Weight: 247.6 lb Height: 63in Body Surface Area: 2.12 m Body Mass Index: 43.86 kg/m  Temp.: 97.56F(Temporal)  Pulse: 80 (Regular)  BP: 130/78 (Sitting, Left Arm, Standard)      Physical Exam Misty Hiss M. Geoffry Bannister MD; 08/27/2015 5:24 PM)  General Mental Status-Alert. General Appearance-Consistent with stated age. Hydration-Well hydrated. Voice-Normal. Note: morbidly obese  Head and Neck Head-normocephalic, atraumatic with no lesions or palpable masses. Trachea-midline. Thyroid Gland  Characteristics - normal size and consistency.  Eye Eyeball - Bilateral-Extraocular movements intact. Sclera/Conjunctiva - Bilateral-No scleral icterus.  Chest and Lung Exam Chest and lung exam reveals -quiet, even and easy respiratory effort with no use of accessory muscles and on auscultation, normal breath sounds, no adventitious sounds and normal vocal resonance. Inspection Chest Wall - Normal. Back - normal.  Breast - Did not examine.  Cardiovascular Cardiovascular examination reveals -normal heart sounds, regular rate and rhythm with no murmurs and normal pedal pulses bilaterally.  Abdomen Inspection Inspection of the abdomen reveals - No Hernias. Skin - Scar - no surgical scars. Palpation/Percussion Palpation and Percussion of the abdomen reveal - Soft, Non Tender, No Rebound tenderness, No Rigidity (guarding) and No hepatosplenomegaly. Auscultation Auscultation of the abdomen reveals - Bowel sounds normal.  Peripheral Vascular Upper Extremity Palpation - Pulses bilaterally normal.  Neurologic Neurologic evaluation reveals -alert and oriented x 3 with no impairment of recent or remote memory. Mental Status-Normal.  Neuropsychiatric The patient's mood and affect are described as -normal. Judgment and Insight-insight is appropriate concerning matters relevant to self.  Musculoskeletal Normal Exam - Left-Upper Extremity Strength Normal and Lower Extremity Strength Normal. Normal Exam - Right-Upper Extremity Strength Normal and Lower Extremity Strength Normal. Note: bilateral knee crepitus  Lymphatic Head & Neck  General Head & Neck Lymphatics: Bilateral - Description - Normal. Axillary - Did not examine. Femoral & Inguinal - Did not examine.    Assessment & Plan Misty Hiss M. Omayra Tulloch MD; 08/27/2015 5:29 PM)  OBESITY, MORBID, BMI 40.0-49.9 Impression: We reviewed her preoperative workup. We discussed the findings of a hiatal hernia. We discussed  that we would test for one intraoperatively. If she was found to have a clinically significant one I recommended interoperative repair. We discussed what that would involve. We discussed the risk and potential complications associated with that repair. We also discussed potentially complications that can occur without repairing it. She was given her postoperative pain medicine prescription, nausea and heartburn medication prescriptions today. All of her questions were asked and answered. A total of 30 minutes was spent counseling the patient. We rediscussed the typical postoperative  pathway. Because she is out of her prescriptions I will refill her blood pressure and cholesterol prescriptions until she is able to locate a new primary care physician  Current Plans Pt Education - EMW_preopbariatric Started OxyCODONE HCl 5MG /5ML, 5-10 Milliliter every four hours, as needed, 200 Milliliter, 08/27/2015, No Refill. Started Ondansetron 4MG , 1 (one) Tablet every six hours, as needed, #30, 08/27/2015, No Refill. Started Pantoprazole Sodium 40MG , 1 (one) Tablet daily, #30, 08/27/2015, No Refill. 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 Started AmLODIPine Besylate 10MG , 1 (one) Tablet daily, #30, 08/27/2015, No Refill. Started Potassium Chloride Crys ER 20MEQ, 1 (one) Tablet daily, #30, 08/27/2015, No Refill. Started Lasix 20MG ,  (one half) Tablet daily, #30, 08/27/2015, No Refill. DYSLIPIDEMIA (E78.5) Impression: See above  Current Plans Started Pravastatin Sodium 20MG , 1 (one) Tablet daily, #30, 08/27/2015, No Refill. HIATAL HERNIA (K44.9) Impression: see above discussion  Leighton Ruff. Redmond Pulling, MD, FACS General, Bariatric, & Minimally Invasive Surgery [facility], [place]

## 2015-08-31 ENCOUNTER — Ambulatory Visit: Payer: BC Managed Care – PPO

## 2015-09-02 ENCOUNTER — Encounter: Payer: Self-pay | Admitting: *Deleted

## 2015-09-02 ENCOUNTER — Encounter: Payer: Self-pay | Admitting: Cardiology

## 2015-09-02 ENCOUNTER — Ambulatory Visit (INDEPENDENT_AMBULATORY_CARE_PROVIDER_SITE_OTHER): Payer: BC Managed Care – PPO | Admitting: Cardiology

## 2015-09-02 VITALS — BP 130/70 | HR 92 | Ht 63.0 in | Wt 246.0 lb

## 2015-09-02 DIAGNOSIS — Z01818 Encounter for other preprocedural examination: Secondary | ICD-10-CM

## 2015-09-02 DIAGNOSIS — E661 Drug-induced obesity: Secondary | ICD-10-CM

## 2015-09-02 DIAGNOSIS — I10 Essential (primary) hypertension: Secondary | ICD-10-CM

## 2015-09-02 DIAGNOSIS — R0609 Other forms of dyspnea: Secondary | ICD-10-CM | POA: Diagnosis not present

## 2015-09-02 DIAGNOSIS — I517 Cardiomegaly: Secondary | ICD-10-CM

## 2015-09-02 HISTORY — DX: Other forms of dyspnea: R06.09

## 2015-09-02 LAB — COMPREHENSIVE METABOLIC PANEL
ALT: 17 U/L (ref 6–29)
AST: 14 U/L (ref 10–35)
Albumin: 3.8 g/dL (ref 3.6–5.1)
Alkaline Phosphatase: 91 U/L (ref 33–130)
BUN: 16 mg/dL (ref 7–25)
CO2: 27 mmol/L (ref 20–31)
Calcium: 9.4 mg/dL (ref 8.6–10.4)
Chloride: 101 mmol/L (ref 98–110)
Creat: 0.7 mg/dL (ref 0.50–1.05)
Glucose, Bld: 121 mg/dL — ABNORMAL HIGH (ref 65–99)
Potassium: 4 mmol/L (ref 3.5–5.3)
Sodium: 139 mmol/L (ref 135–146)
Total Bilirubin: 0.3 mg/dL (ref 0.2–1.2)
Total Protein: 7.3 g/dL (ref 6.1–8.1)

## 2015-09-02 LAB — LIPID PANEL
Cholesterol: 263 mg/dL — ABNORMAL HIGH (ref 125–200)
HDL: 64 mg/dL (ref >=46)
LDL Cholesterol: 170 mg/dL — ABNORMAL HIGH (ref <130)
Total CHOL/HDL Ratio: 4.1 Ratio (ref <=5.0)
Triglycerides: 147 mg/dL (ref <150)
VLDL: 29 mg/dL (ref <30)

## 2015-09-02 NOTE — Patient Instructions (Signed)
Medication Instructions:   Your physician recommends that you continue on your current medications as directed. Please refer to the Current Medication list given to you today.   Labwork:  TODAY--CMET AND LIPIDS    Follow-Up:  4 MONTHS WITH DR Meda Coffee      If you need a refill on your cardiac medications before your next appointment, please call your pharmacy.

## 2015-09-02 NOTE — Progress Notes (Signed)
Patient ID: Misty Bell, female   DOB: [birth year], 60 y.o.   MRN: [id number]      Cardiology Office Note  Date:  09/02/2015   ID:  Misty Bell, DOB Nov 01, 1955, MRN [id number]  PCP:  No primary care provider on file.  Cardiologist:  Dorothy Spark, MD   Chief complain: Preop evaluation for gastric sleeve surgery, DOE  History of Present Illness: 60 year old female with h/o obesity, treated hypertension, hyperlipidemia. She has sedentary job and doesn't exercise much. She has tried several ways of loosing weight and was unsuccessful, now decided to undergo a gastric sleeve surgery at [facility]. She has never smoked, denies chest pain, is postmenopausal. She has noticed worsening exercise capacity and worsening SOB on exertion in the last 3 years. No palpitations, syncope, mild on and off LE edema, Left > right, no claudications, or syncope. No orthopnea or PND.  Mother had MI at age 50 and passed away. GM had a CABG at age 74.   09/02/2015 - patient is coming after 4 months, she underwent echocardiographic in showed hyperdynamic LV EF but mild concentric LVH and stage I diastolic dysfunction. Her stress test was negative for prior infarct or ischemia but show hypertensive response to stress. Lisinopril 5 mg was added to her regimen. She is not very active but very motivated to start exercising and lose weight. She is undergoing gastric sleeve surgery next Monday, 10/11/2015.  Past Medical History  Diagnosis Date  . Primary osteoarthritis of both knees   . Hypertension 2010  . Varicose veins   . Migraine   . Anxiety disorder   . Back pain   . Diabetes mellitus type 2 in obese ([place])   . Snoring     Past Surgical History  Procedure Laterality Date  . Fracture surgery      neck  . Uterine ablation  08/2010    Current outpatient prescriptions:  .  amLODipine (NORVASC) 10 MG tablet, Take 10 mg by mouth daily., Disp: , Rfl: 0 .  furosemide (LASIX) 20 MG tablet, Take 10 mg by  mouth daily. , Disp: , Rfl: 0 .  lisinopril (PRINIVIL,ZESTRIL) 5 MG tablet, Take 1 tablet (5 mg total) by mouth daily., Disp: 90 tablet, Rfl: 3 .  Potassium Chloride ER 20 MEQ TBCR, Take 1 tablet by mouth daily., Disp: , Rfl:  .  pravastatin (PRAVACHOL) 20 MG tablet, Take 20 mg by mouth daily., Disp: , Rfl: 0 .  ondansetron (ZOFRAN) 4 MG tablet, Take 4 mg by mouth every 8 (eight) hours as needed for nausea or vomiting. Reported on 09/02/2015, Disp: , Rfl:  .  oxycodone (OXY-IR) 5 MG capsule, Take 5 mg by mouth every 4 (four) hours as needed., Disp: , Rfl:  .  pantoprazole (PROTONIX) 40 MG tablet, Take 1 tablet by mouth daily. Reported on 09/02/2015, Disp: , Rfl:    Allergies:   Review of patient's allergies indicates no known allergies.    Social History:  The patient  reports that she has been smoking Cigarettes.  She has never used smokeless tobacco. She reports that she drinks alcohol. She reports that she does not use illicit drugs.   Family History:  The patient's family history includes Cervical cancer in her mother; Diabetes in her other; Heart disease in her mother and other. There is no history of Colon cancer.   ROS:  Please see the history of present illness.   Otherwise, review of systems are positive for none.  All other systems are reviewed and negative.   PHYSICAL EXAM: VS:  BP 130/70 mmHg  Pulse 92  Ht 5\' 3"  (1.6 m)  Wt 246 lb (111.585 kg)  BMI 43.59 kg/m2 , BMI Body mass index is 43.59 kg/(m^2). GEN: Well nourished, well developed, in no acute distress HEENT: normal Neck: no JVD, carotid bruits, or masses Cardiac: RRR; no murmurs, rubs, or gallops, mild LE edema L>R Respiratory:  clear to auscultation bilaterally, normal work of breathing GI: soft, nontender, nondistended, + BS MS: no deformity or atrophy Skin: warm and dry, no rash Neuro:  Strength and sensation are intact Psych: euthymic mood, full affect  EKG:  EKG is ordered today. The ekg ordered today  demonstrates Sinus tachycardia , possible LAE  Recent Labs: No results found for requested labs within last 365 days.   Lipid Panel No results found for: CHOL, TRIG, HDL, CHOLHDL, VLDL, LDLCALC, LDLDIRECT   Wt Readings from Last 3 Encounters:  09/02/15 246 lb (111.585 kg)  05/07/15 245 lb 11.2 oz (111.449 kg)  04/15/15 245 lb (111.131 kg)     TTE: 04/15/2015 - Left ventricle: The cavity size was normal. Wall thickness was  increased in a pattern of mild LVH. Systolic function was normal.  The estimated ejection fraction was in the range of 60% to 65%.  Wall motion was normal; there were no regional wall motion  abnormalities. Doppler parameters are consistent with abnormal  left ventricular relaxation (grade 1 diastolic dysfunction). - Aortic valve: There was no stenosis. - Mitral valve: There was no significant regurgitation. - Right ventricle: The cavity size was normal. Systolic function  was normal. - Pulmonary arteries: No complete TR doppler jet so unable to  estimate PA systolic pressure. - Inferior vena cava: The vessel was normal in size. The  respirophasic diameter changes were in the normal range (>= 50%),  consistent with normal central venous pressure.  Impressions:  - Normal LV size with mild LV hypertrophy. EF 60-65%. Normal RV  size and systolic function. No significant valvular  abnormalities.  Nuclear stress test: 03/2015  Nuclear stress EF: 73%.  Blood pressure demonstrated a hypertensive response to exercise.  There was no ST segment deviation noted during stress.  The study is normal.  This is a low risk study.  The left ventricular ejection fraction is normal (55-65%).  Normal resting and stress perfusion EF 73%    ASSESSMENT AND PLAN:  1.  DOE - RF include FH of prematuer CAD, postmenopause, HTN and HLP, IGT. Negative stress test for infarct or ischemia but hypertensive response to stress she was starting lisinopril with  improvement of her blood pressure no significant improvement of dyspnea on exertion so far yet. She has significant obesity and physical activity, he is she's going to undergo gastric sleeve surgery and start exercise program she is very motivated and very happy for her.  2. Hypertension - as above, lisinopril added, we'll check creatinine today.  3. HLP - followed by PCP, on pravastatin  4. Preop evaluation - there is no contraindication for her to undergo gastric sleeve surgery.  Follow up in 4 months.  Signed, Dorothy Spark, MD  09/02/2015 9:22 AM    [facility] [place], [place], [place]  [address] Phone: [phone]; Fax: [phone]

## 2015-09-03 ENCOUNTER — Telehealth: Payer: Self-pay

## 2015-09-03 DIAGNOSIS — Z79899 Other long term (current) drug therapy: Secondary | ICD-10-CM

## 2015-09-03 MED ORDER — ROSUVASTATIN CALCIUM 10 MG PO TABS: 10.0000 mg | ORAL_TABLET | Freq: Every day | ORAL | Status: DC

## 2015-09-03 NOTE — Telephone Encounter (Signed)
Called patient about her lab results. Per Dr. Meda Coffee, Both LDL and Triglycerides are elevated, I would switch to rosuvastatin 10 mg po daily and repeat CMP and lipids in 4-6 weeks. Patient verbalized understanding. Patient will come in on April 12th for lab work, and sent rosuvastatin 10 mg to patient's pharmacy of choice.

## 2015-09-03 NOTE — Telephone Encounter (Signed)
-----   Message from Dorothy Spark, MD sent at 09/03/2015  7:31 AM EST ----- Both LDL and Triglycerides are elevated, I would switch to rosuvastatin 10 mg po daily and repeat CMP and lipids in 4-6 weeks.

## 2015-09-04 ENCOUNTER — Inpatient Hospital Stay (HOSPITAL_COMMUNITY): Admission: RE | Admit: 2015-09-04 | Payer: BC Managed Care – PPO | Source: Ambulatory Visit

## 2015-09-07 ENCOUNTER — Encounter (HOSPITAL_COMMUNITY)
Admission: RE | Admit: 2015-09-07 | Discharge: 2015-09-07 | Disposition: A | Discharge status: Home / Self Care | Payer: BC Managed Care – PPO | Source: Ambulatory Visit | Attending: General Surgery | Admitting: General Surgery

## 2015-09-07 ENCOUNTER — Encounter: Payer: BC Managed Care – PPO | Attending: General Surgery

## 2015-09-07 ENCOUNTER — Encounter (HOSPITAL_COMMUNITY): Payer: Self-pay

## 2015-09-07 DIAGNOSIS — Z6841 Body Mass Index (BMI) 40.0 and over, adult: Secondary | ICD-10-CM | POA: Insufficient documentation

## 2015-09-07 DIAGNOSIS — Z87891 Personal history of nicotine dependence: Secondary | ICD-10-CM | POA: Diagnosis not present

## 2015-09-07 DIAGNOSIS — Z01812 Encounter for preprocedural laboratory examination: Secondary | ICD-10-CM | POA: Insufficient documentation

## 2015-09-07 DIAGNOSIS — R0683 Snoring: Secondary | ICD-10-CM | POA: Insufficient documentation

## 2015-09-07 DIAGNOSIS — M170 Bilateral primary osteoarthritis of knee: Secondary | ICD-10-CM | POA: Insufficient documentation

## 2015-09-07 HISTORY — DX: Anxiety disorder, unspecified: F41.9

## 2015-09-07 LAB — CBC WITH DIFFERENTIAL/PLATELET
BASOS ABS: 0 10*3/uL (ref 0.0–0.1)
Basophils Relative: 0 %
Eosinophils Absolute: 0.1 10*3/uL (ref 0.0–0.7)
Eosinophils Relative: 1 %
HEMATOCRIT: 47.8 % — AB (ref 36.0–46.0)
Hemoglobin: 15.9 g/dL — ABNORMAL HIGH (ref 12.0–15.0)
LYMPHS ABS: 4.1 10*3/uL — AB (ref 0.7–4.0)
LYMPHS PCT: 33 %
MCH: 29.6 pg (ref 26.0–34.0)
MCHC: 33.3 g/dL (ref 30.0–36.0)
MCV: 89 fL (ref 78.0–100.0)
MONO ABS: 0.7 10*3/uL (ref 0.1–1.0)
MONOS PCT: 6 %
NEUTROS ABS: 7.4 10*3/uL (ref 1.7–7.7)
Neutrophils Relative %: 60 %
Platelets: 334 10*3/uL (ref 150–400)
RBC: 5.37 MIL/uL — ABNORMAL HIGH (ref 3.87–5.11)
RDW: 13.9 % (ref 11.5–15.5)
WBC: 12.4 10*3/uL — ABNORMAL HIGH (ref 4.0–10.5)

## 2015-09-07 LAB — COMPREHENSIVE METABOLIC PANEL
ALT: 20 U/L (ref 14–54)
AST: 20 U/L (ref 15–41)
Albumin: 3.9 g/dL (ref 3.5–5.0)
Alkaline Phosphatase: 88 U/L (ref 38–126)
Anion gap: 10 (ref 5–15)
BILIRUBIN TOTAL: 0.5 mg/dL (ref 0.3–1.2)
BUN: 18 mg/dL (ref 6–20)
CO2: 27 mmol/L (ref 22–32)
Calcium: 9.4 mg/dL (ref 8.9–10.3)
Chloride: 103 mmol/L (ref 101–111)
Creatinine, Ser: 0.68 mg/dL (ref 0.44–1.00)
GFR calc Af Amer: >60 mL/min (ref >60)
Glucose, Bld: 113 mg/dL — ABNORMAL HIGH (ref 65–99)
POTASSIUM: 4.3 mmol/L (ref 3.5–5.1)
Sodium: 140 mmol/L (ref 135–145)
TOTAL PROTEIN: 8.1 g/dL (ref 6.5–8.1)

## 2015-09-07 NOTE — Patient Instructions (Signed)
Misty Bell [facility]  09/07/2015   Your procedure is scheduled on: Monday 09/14/2015  Report to [facility] Main  Entrance take [place]  elevators to 3rd floor to  [place] at  [place] AM.  Call this number if you have problems the morning of surgery [phone]   Remember: ONLY 1 PERSON MAY GO WITH YOU TO SHORT STAY TO GET  READY MORNING OF [place].   Do not eat food or drink liquids :After Midnight.     Take these medicines the morning of surgery with A SIP OF WATER: Amlodipine                                 You may not have any metal on your body including hair pins and              piercings  Do not wear jewelry, make-up, lotions, powders or perfumes, deodorant             Do not wear nail polish.  Do not shave  48 hours prior to surgery.              Men may shave face and neck.   Do not bring valuables to the hospital. [place].  Contacts, dentures or bridgework may not be worn into surgery.  Leave suitcase in the car. After surgery it may be brought to your room.                  Please read over the following fact sheets you were given:Incentive spirometry _____________________________________________________________________             [facility] - Preparing for Surgery Before surgery, you can play an important role.  Because skin is not sterile, your skin needs to be as free of germs as possible.  You can reduce the number of germs on your skin by washing with CHG (chlorahexidine gluconate) soap before surgery.  CHG is an antiseptic cleaner which kills germs and bonds with the skin to continue killing germs even after washing. Please DO NOT use if you have an allergy to CHG or antibacterial soaps.  If your skin becomes reddened/irritated stop using the CHG and inform your nurse when you arrive at Short Stay. Do not shave (including legs and underarms) for at least 48 hours prior to the  first CHG shower.  You may shave your face/neck. Please follow these instructions carefully:  1.  Shower with CHG Soap the night before surgery and the  morning of Surgery.  2.  If you choose to wash your hair, wash your hair first as usual with your  normal  shampoo.  3.  After you shampoo, rinse your hair and body thoroughly to remove the  shampoo.                           4.  Use CHG as you would any other liquid soap.  You can apply chg directly  to the skin and wash                       Gently with a scrungie or clean washcloth.  5.  Apply the CHG Soap to your body ONLY FROM THE NECK DOWN.   Do not use on face/ open                           Wound or open sores. Avoid contact with eyes, ears mouth and genitals (private parts).                       Wash face,  Genitals (private parts) with your normal soap.             6.  Wash thoroughly, paying special attention to the area where your surgery  will be performed.  7.  Thoroughly rinse your body with warm water from the neck down.  8.  DO NOT shower/wash with your normal soap after using and rinsing off  the CHG Soap.                9.  Pat yourself dry with a clean towel.            10.  Wear clean pajamas.            11.  Place clean sheets on your bed the night of your first shower and do not  sleep with pets. Day of Surgery : Do not apply any lotions/deodorants the morning of surgery.  Please wear clean clothes to the hospital/surgery center.  FAILURE TO FOLLOW THESE INSTRUCTIONS MAY RESULT IN THE CANCELLATION OF YOUR SURGERY PATIENT SIGNATURE_________________________________  NURSE SIGNATURE__________________________________  ________________________________________________________________________   Adam Phenix  An incentive spirometer is a tool that can help keep your lungs clear and active. This tool measures how well you are filling your lungs with each breath. Taking long deep breaths may help reverse or decrease  the chance of developing breathing (pulmonary) problems (especially infection) following:  A long period of time when you are unable to move or be active. BEFORE THE PROCEDURE   If the spirometer includes an indicator to show your best effort, your nurse or respiratory therapist will set it to a desired goal.  If possible, sit up straight or lean slightly forward. Try not to slouch.  Hold the incentive spirometer in an upright position. INSTRUCTIONS FOR USE  1. Sit on the edge of your bed if possible, or sit up as far as you can in bed or on a chair. 2. Hold the incentive spirometer in an upright position. 3. Breathe out normally. 4. Place the mouthpiece in your mouth and seal your lips tightly around it. 5. Breathe in slowly and as deeply as possible, raising the piston or the ball toward the top of the column. 6. Hold your breath for 3-5 seconds or for as long as possible. Allow the piston or ball to fall to the bottom of the column. 7. Remove the mouthpiece from your mouth and breathe out normally. 8. Rest for a few seconds and repeat Steps 1 through 7 at least 10 times every 1-2 hours when you are awake. Take your time and take a few normal breaths between deep breaths. 9. The spirometer may include an indicator to show your best effort. Use the indicator as a goal to work toward during each repetition. 10. After each set of 10 deep breaths, practice coughing to be sure your lungs are clear. If you have an incision (the cut made at the time of surgery), support your incision when coughing by placing a pillow or  rolled up towels firmly against it. Once you are able to get out of bed, walk around indoors and cough well. You may stop using the incentive spirometer when instructed by your caregiver.  RISKS AND COMPLICATIONS  Take your time so you do not get dizzy or light-headed.  If you are in pain, you may need to take or ask for pain medication before doing incentive spirometry. It is  harder to take a deep breath if you are having pain. AFTER USE  Rest and breathe slowly and easily.  It can be helpful to keep track of a log of your progress. Your caregiver can provide you with a simple table to help with this. If you are using the spirometer at home, follow these instructions: [place] IF:   You are having difficultly using the spirometer.  You have trouble using the spirometer as often as instructed.  Your pain medication is not giving enough relief while using the spirometer.  You develop fever of 100.5 F (38.1 C) or higher. SEEK IMMEDIATE MEDICAL CARE IF:   You cough up bloody sputum that had not been present before.  You develop fever of 102 F (38.9 C) or greater.  You develop worsening pain at or near the incision site. MAKE SURE YOU:   Understand these instructions.  Will watch your condition.  Will get help right away if you are not doing well or get worse. Document Released: 10/24/2006 Document Revised: 09/05/2011 Document Reviewed: 12/25/2006 [facility] Patient Information 2014 [place], [place].   ________________________________________________________________________

## 2015-09-08 LAB — HEMOGLOBIN A1C
Hgb A1c MFr Bld: 7 % — ABNORMAL HIGH (ref 4.8–5.6)
Mean Plasma Glucose: 154 mg/dL

## 2015-09-08 NOTE — Progress Notes (Signed)
  Pre-Operative Nutrition Class:  Appt start time: 4388   End time:  1830.  Patient was seen on 09/07/2015 for Pre-Operative Bariatric Surgery [facility]   Surgery date: 09/14/2015 Surgery type: Sleeve gastrectomy Start weight at [facility]: 246 lbs on 05/07/2015 Weight today: 242.1 lbs  [place]ANITA  BODY COMP RESULTS  09/07/15   BMI (kg/m^2) N/A   Fat Mass (lbs)    Fat Free Mass (lbs)    Total Body Water (lbs)    Samples given per MNT protocol. Patient educated on appropriate usage: Premier protein shake (chocolate - qty 1) Lot #: [id number] Exp: 12/2015  Celebrate Calcium Citrate chew (caramel - qty 1) Lot #: [id number] Exp: 08/2016  Celebrate Vitamins Multivitamin (pineapple strawberry - qty 1) Lot #: [id number] Exp: 05/2016  PB2 (qty 1) Lot #: [id number] Exp: 03/2017  Unjury Protein Powder (strawberry - qty 1) Lot #: [id number] Exp: 04/2016  The following the learning objectives were met by the patient during this course:  Identify Pre-Op Dietary Goals and will begin 2 weeks pre-operatively  Identify appropriate sources of fluids and proteins   State protein recommendations and appropriate sources pre and post-operatively  Identify Post-Operative Dietary Goals and will follow for 2 weeks post-operatively  Identify appropriate multivitamin and calcium sources  Describe the need for physical activity post-operatively and will follow MD recommendations  State when to call healthcare provider regarding medication questions or post-operative complications  Handouts given during class include:  Pre-Op Bariatric Surgery Diet Handout  Protein Shake Handout  Post-Op Bariatric Surgery Nutrition Handout  [facility] Program Information Flyer  Support Group Information Flyer  [facility] Bariatric Supplements Price List  Follow-Up Plan: Patient will follow-up at [facility] 2 weeks post operatively for diet advancement per MD.

## 2015-09-13 NOTE — Anesthesia Preprocedure Evaluation (Signed)
Anesthesia Evaluation  Patient identified by MRN, date of birth, ID band Patient awake    Reviewed: Allergy & Precautions, H&P , NPO status , Patient's Chart, lab work & pertinent test results  Airway Mallampati: II  TM Distance: >3 FB Neck ROM: full    Dental no notable dental hx. (+) Teeth Intact, Dental Advisory Given   Pulmonary neg pulmonary ROS, shortness of breath and with exertion, former smoker,    Pulmonary exam normal breath sounds clear to auscultation       Cardiovascular Exercise Tolerance: Good hypertension, Pt. on medications + DOE  negative cardio ROS Normal cardiovascular exam Rhythm:regular Rate:Normal     Neuro/Psych negative neurological ROS  negative psych ROS   GI/Hepatic negative GI ROS, Neg liver ROS,   Endo/Other  diabetes, Well Controlled, Type 2Morbid obesityDiet controlled DM  Renal/GU negative Renal ROS  negative genitourinary   Musculoskeletal   Abdominal (+) + obese,   Peds  Hematology negative hematology ROS (+)   Anesthesia Other Findings   Reproductive/Obstetrics negative OB ROS                             Anesthesia Physical Anesthesia Plan  ASA: III  Anesthesia Plan: General   Post-op Pain Management:    Induction: Intravenous  Airway Management Planned: Oral ETT  Additional Equipment:   Intra-op Plan:   Post-operative Plan:   Informed Consent: I have reviewed the patients History and Physical, chart, labs and discussed the procedure including the risks, benefits and alternatives for the proposed anesthesia with the patient or authorized representative who has indicated his/her understanding and acceptance.   Dental Advisory Given  Plan Discussed with: CRNA and Surgeon  Anesthesia Plan Comments:         Anesthesia Quick Evaluation

## 2015-09-14 ENCOUNTER — Inpatient Hospital Stay (HOSPITAL_COMMUNITY): Payer: BC Managed Care – PPO | Admitting: Anesthesiology

## 2015-09-14 ENCOUNTER — Encounter (HOSPITAL_COMMUNITY): Payer: Self-pay | Admitting: *Deleted

## 2015-09-14 ENCOUNTER — Inpatient Hospital Stay (HOSPITAL_COMMUNITY)
Admission: RE | Admit: 2015-09-14 | Discharge: 2015-09-16 | DRG: 621 | Disposition: A | Discharge status: Home / Self Care | Payer: BC Managed Care – PPO | Source: Ambulatory Visit | Attending: General Surgery | Admitting: General Surgery

## 2015-09-14 ENCOUNTER — Encounter (HOSPITAL_COMMUNITY)
Admission: RE | Disposition: A | Discharge status: Home / Self Care | Payer: Self-pay | Source: Ambulatory Visit | Attending: General Surgery

## 2015-09-14 DIAGNOSIS — Z6841 Body Mass Index (BMI) 40.0 and over, adult: Secondary | ICD-10-CM | POA: Diagnosis not present

## 2015-09-14 DIAGNOSIS — K449 Diaphragmatic hernia without obstruction or gangrene: Secondary | ICD-10-CM

## 2015-09-14 DIAGNOSIS — E66813 Obesity, class 3: Secondary | ICD-10-CM | POA: Diagnosis present

## 2015-09-14 DIAGNOSIS — Z87891 Personal history of nicotine dependence: Secondary | ICD-10-CM | POA: Diagnosis not present

## 2015-09-14 DIAGNOSIS — E119 Type 2 diabetes mellitus without complications: Secondary | ICD-10-CM | POA: Diagnosis present

## 2015-09-14 DIAGNOSIS — E7800 Pure hypercholesterolemia, unspecified: Secondary | ICD-10-CM | POA: Diagnosis present

## 2015-09-14 DIAGNOSIS — E669 Obesity, unspecified: Secondary | ICD-10-CM

## 2015-09-14 DIAGNOSIS — Z8049 Family history of malignant neoplasm of other genital organs: Secondary | ICD-10-CM

## 2015-09-14 DIAGNOSIS — E785 Hyperlipidemia, unspecified: Secondary | ICD-10-CM | POA: Diagnosis present

## 2015-09-14 DIAGNOSIS — E1169 Type 2 diabetes mellitus with other specified complication: Secondary | ICD-10-CM

## 2015-09-14 DIAGNOSIS — R110 Nausea: Secondary | ICD-10-CM

## 2015-09-14 DIAGNOSIS — Z79899 Other long term (current) drug therapy: Secondary | ICD-10-CM

## 2015-09-14 DIAGNOSIS — Z01812 Encounter for preprocedural laboratory examination: Secondary | ICD-10-CM | POA: Diagnosis not present

## 2015-09-14 DIAGNOSIS — I10 Essential (primary) hypertension: Secondary | ICD-10-CM | POA: Diagnosis present

## 2015-09-14 DIAGNOSIS — R000 Tachycardia, unspecified: Secondary | ICD-10-CM | POA: Diagnosis not present

## 2015-09-14 DIAGNOSIS — M170 Bilateral primary osteoarthritis of knee: Secondary | ICD-10-CM

## 2015-09-14 HISTORY — DX: Bilateral primary osteoarthritis of knee: M17.0

## 2015-09-14 HISTORY — PX: LAPAROSCOPIC GASTRIC SLEEVE RESECTION: SHX5895

## 2015-09-14 HISTORY — DX: Diaphragmatic hernia without obstruction or gangrene: K44.9

## 2015-09-14 HISTORY — DX: Obesity, class 3: E66.813

## 2015-09-14 HISTORY — DX: Pure hypercholesterolemia, unspecified: E78.00

## 2015-09-14 HISTORY — PX: HIATAL HERNIA REPAIR: SHX195

## 2015-09-14 LAB — GLUCOSE, CAPILLARY
GLUCOSE-CAPILLARY: 191 mg/dL — AB (ref 65–99)
Glucose-Capillary: 105 mg/dL — ABNORMAL HIGH (ref 65–99)

## 2015-09-14 LAB — HEMOGLOBIN AND HEMATOCRIT, BLOOD
HCT: 45.4 % (ref 36.0–46.0)
Hemoglobin: 15 g/dL (ref 12.0–15.0)

## 2015-09-14 SURGERY — GASTRECTOMY, SLEEVE, LAPAROSCOPIC
Anesthesia: General | Site: Abdomen

## 2015-09-14 MED ORDER — ETOMIDATE 2 MG/ML IV SOLN
INTRAVENOUS | Status: AC
  Filled 2015-09-14: qty 10

## 2015-09-14 MED ORDER — ACETAMINOPHEN 10 MG/ML IV SOLN
1000.0000 mg | Freq: Four times a day (QID) | INTRAVENOUS | Status: AC
  Administered 2015-09-14 – 2015-09-15 (×3): 1000 mg via INTRAVENOUS
  Filled 2015-09-14 (×4): qty 100

## 2015-09-14 MED ORDER — 0.9 % SODIUM CHLORIDE (POUR BTL) OPTIME
TOPICAL | Status: DC | PRN
  Administered 2015-09-14: 1000 mL

## 2015-09-14 MED ORDER — HYDROMORPHONE HCL 1 MG/ML IJ SOLN
INTRAMUSCULAR | Status: AC
  Filled 2015-09-14: qty 1

## 2015-09-14 MED ORDER — OXYCODONE HCL 5 MG/5ML PO SOLN
5.0000 mg | ORAL | Status: DC | PRN
  Administered 2015-09-15 – 2015-09-16 (×3): 5 mg via ORAL
  Filled 2015-09-14 (×3): qty 5

## 2015-09-14 MED ORDER — ACETAMINOPHEN 160 MG/5ML PO SOLN: 325.0000 mg | ORAL | Status: DC | PRN

## 2015-09-14 MED ORDER — METOCLOPRAMIDE HCL 5 MG/ML IJ SOLN
INTRAMUSCULAR | Status: DC | PRN
  Administered 2015-09-14: 10 mg via INTRAVENOUS

## 2015-09-14 MED ORDER — CEFOTETAN DISODIUM-DEXTROSE 2-2.08 GM-% IV SOLR
INTRAVENOUS | Status: AC
  Filled 2015-09-14: qty 50

## 2015-09-14 MED ORDER — FENTANYL CITRATE (PF) 250 MCG/5ML IJ SOLN
INTRAMUSCULAR | Status: AC
Start: 2015-09-14 — End: 2015-09-14
  Filled 2015-09-14: qty 5

## 2015-09-14 MED ORDER — PHENYLEPHRINE 40 MCG/ML (10ML) SYRINGE FOR IV PUSH (FOR BLOOD PRESSURE SUPPORT)
PREFILLED_SYRINGE | INTRAVENOUS | Status: AC
  Filled 2015-09-14: qty 10

## 2015-09-14 MED ORDER — SODIUM CHLORIDE 0.9 % IJ SOLN
INTRAMUSCULAR | Status: AC
  Filled 2015-09-14: qty 10

## 2015-09-14 MED ORDER — MORPHINE SULFATE (PF) 2 MG/ML IV SOLN
2.0000 mg | INTRAVENOUS | Status: DC | PRN
  Administered 2015-09-14: 2 mg via INTRAVENOUS
  Administered 2015-09-14: 4 mg via INTRAVENOUS
  Administered 2015-09-14 (×2): 2 mg via INTRAVENOUS
  Administered 2015-09-15: 4 mg via INTRAVENOUS
  Administered 2015-09-15: 6 mg via INTRAVENOUS
  Administered 2015-09-15 (×4): 4 mg via INTRAVENOUS
  Filled 2015-09-14: qty 1
  Filled 2015-09-14 (×5): qty 2
  Filled 2015-09-14: qty 1
  Filled 2015-09-14: qty 3
  Filled 2015-09-14: qty 1
  Filled 2015-09-14: qty 2

## 2015-09-14 MED ORDER — HYDRALAZINE HCL 20 MG/ML IJ SOLN
INTRAMUSCULAR | Status: AC
  Filled 2015-09-14: qty 2

## 2015-09-14 MED ORDER — HEPARIN SODIUM (PORCINE) 5000 UNIT/ML IJ SOLN
5000.0000 [IU] | INTRAMUSCULAR | Status: AC
  Administered 2015-09-14: 5000 [IU] via SUBCUTANEOUS
  Filled 2015-09-14: qty 1

## 2015-09-14 MED ORDER — ACETAMINOPHEN 160 MG/5ML PO SOLN: 650.0000 mg | ORAL | Status: DC | PRN

## 2015-09-14 MED ORDER — KCL IN DEXTROSE-NACL 20-5-0.45 MEQ/L-%-% IV SOLN
INTRAVENOUS | Status: DC
  Administered 2015-09-14: 23:00:00 via INTRAVENOUS
  Administered 2015-09-14: 125 mL/h via INTRAVENOUS
  Administered 2015-09-15 – 2015-09-16 (×2): via INTRAVENOUS
  Filled 2015-09-14 (×8): qty 1000

## 2015-09-14 MED ORDER — ONDANSETRON HCL 4 MG/2ML IJ SOLN
4.0000 mg | INTRAMUSCULAR | Status: DC | PRN
  Administered 2015-09-14 – 2015-09-15 (×5): 4 mg via INTRAVENOUS
  Filled 2015-09-14 (×5): qty 2

## 2015-09-14 MED ORDER — SUGAMMADEX SODIUM 200 MG/2ML IV SOLN
INTRAVENOUS | Status: AC
  Filled 2015-09-14: qty 4

## 2015-09-14 MED ORDER — PREMIER PROTEIN SHAKE
2.0000 [oz_av] | Freq: Four times a day (QID) | ORAL | Status: DC
  Administered 2015-09-16 (×2): 2 [oz_av] via ORAL

## 2015-09-14 MED ORDER — PROPOFOL 10 MG/ML IV BOLUS
INTRAVENOUS | Status: DC | PRN
  Administered 2015-09-14: 190 mg via INTRAVENOUS

## 2015-09-14 MED ORDER — EPHEDRINE SULFATE 50 MG/ML IJ SOLN
INTRAMUSCULAR | Status: AC
  Filled 2015-09-14: qty 1

## 2015-09-14 MED ORDER — PROMETHAZINE HCL 25 MG/ML IJ SOLN
12.5000 mg | Freq: Four times a day (QID) | INTRAMUSCULAR | Status: DC | PRN
  Administered 2015-09-14 (×2): 12.5 mg via INTRAVENOUS
  Filled 2015-09-14 (×2): qty 1

## 2015-09-14 MED ORDER — PHENYLEPHRINE HCL 10 MG/ML IJ SOLN
INTRAMUSCULAR | Status: DC | PRN
  Administered 2015-09-14 (×4): 80 ug via INTRAVENOUS

## 2015-09-14 MED ORDER — METOCLOPRAMIDE HCL 5 MG/ML IJ SOLN: 10.0000 mg | Freq: Three times a day (TID) | INTRAMUSCULAR | Status: DC | PRN

## 2015-09-14 MED ORDER — SODIUM CHLORIDE 0.9 % IJ SOLN
INTRAMUSCULAR | Status: AC
  Filled 2015-09-14: qty 50

## 2015-09-14 MED ORDER — HYDROMORPHONE HCL 1 MG/ML IJ SOLN
INTRAMUSCULAR | Status: DC | PRN
  Administered 2015-09-14: 1 mg via INTRAVENOUS

## 2015-09-14 MED ORDER — LIDOCAINE HCL (CARDIAC) 20 MG/ML IV SOLN
INTRAVENOUS | Status: DC | PRN
  Administered 2015-09-14: 80 mg via INTRAVENOUS

## 2015-09-14 MED ORDER — SUCCINYLCHOLINE CHLORIDE 20 MG/ML IJ SOLN
INTRAMUSCULAR | Status: DC | PRN
  Administered 2015-09-14: 100 mg via INTRAVENOUS

## 2015-09-14 MED ORDER — SODIUM CHLORIDE 0.9 % IJ SOLN
INTRAMUSCULAR | Status: DC | PRN
  Administered 2015-09-14: 50 mL

## 2015-09-14 MED ORDER — PROPOFOL 10 MG/ML IV BOLUS
INTRAVENOUS | Status: AC
  Filled 2015-09-14: qty 40

## 2015-09-14 MED ORDER — LACTATED RINGERS IV SOLN: INTRAVENOUS | Status: DC

## 2015-09-14 MED ORDER — HYDROMORPHONE HCL 2 MG/ML IJ SOLN
INTRAMUSCULAR | Status: AC
  Filled 2015-09-14: qty 1

## 2015-09-14 MED ORDER — LACTATED RINGERS IV SOLN
INTRAVENOUS | Status: DC | PRN
  Administered 2015-09-14 (×2): via INTRAVENOUS

## 2015-09-14 MED ORDER — LACTATED RINGERS IR SOLN
Status: DC | PRN
  Administered 2015-09-14: 1000 mL

## 2015-09-14 MED ORDER — ONDANSETRON HCL 4 MG/2ML IJ SOLN
INTRAMUSCULAR | Status: DC | PRN
  Administered 2015-09-14: 4 mg via INTRAVENOUS

## 2015-09-14 MED ORDER — DEXAMETHASONE SODIUM PHOSPHATE 10 MG/ML IJ SOLN
INTRAMUSCULAR | Status: DC | PRN
  Administered 2015-09-14: 8 mg via INTRAVENOUS

## 2015-09-14 MED ORDER — ACETAMINOPHEN 10 MG/ML IV SOLN
INTRAVENOUS | Status: AC
  Filled 2015-09-14: qty 100

## 2015-09-14 MED ORDER — EPHEDRINE SULFATE 50 MG/ML IJ SOLN
INTRAMUSCULAR | Status: DC | PRN
  Administered 2015-09-14 (×2): 10 mg via INTRAVENOUS

## 2015-09-14 MED ORDER — SUGAMMADEX SODIUM 200 MG/2ML IV SOLN
INTRAVENOUS | Status: DC | PRN
  Administered 2015-09-14: 400 mg via INTRAVENOUS

## 2015-09-14 MED ORDER — HYDROMORPHONE HCL 1 MG/ML IJ SOLN
0.2500 mg | INTRAMUSCULAR | Status: DC | PRN
  Administered 2015-09-14 (×3): 0.5 mg via INTRAVENOUS

## 2015-09-14 MED ORDER — ENOXAPARIN SODIUM 30 MG/0.3ML ~~LOC~~ SOLN
30.0000 mg | Freq: Two times a day (BID) | SUBCUTANEOUS | Status: DC
  Administered 2015-09-15 – 2015-09-16 (×3): 30 mg via SUBCUTANEOUS
  Filled 2015-09-14 (×5): qty 0.3

## 2015-09-14 MED ORDER — MIDAZOLAM HCL 2 MG/2ML IJ SOLN
INTRAMUSCULAR | Status: AC
  Filled 2015-09-14: qty 2

## 2015-09-14 MED ORDER — FENTANYL CITRATE (PF) 100 MCG/2ML IJ SOLN
INTRAMUSCULAR | Status: DC | PRN
  Administered 2015-09-14: 100 ug via INTRAVENOUS
  Administered 2015-09-14: 50 ug via INTRAVENOUS
  Administered 2015-09-14: 100 ug via INTRAVENOUS

## 2015-09-14 MED ORDER — ACETAMINOPHEN 10 MG/ML IV SOLN
INTRAVENOUS | Status: DC | PRN
  Administered 2015-09-14: 1000 mg via INTRAVENOUS

## 2015-09-14 MED ORDER — DEXAMETHASONE SODIUM PHOSPHATE 10 MG/ML IJ SOLN
INTRAMUSCULAR | Status: AC
  Filled 2015-09-14: qty 1

## 2015-09-14 MED ORDER — ESMOLOL HCL 100 MG/10ML IV SOLN
INTRAVENOUS | Status: AC
  Filled 2015-09-14: qty 10

## 2015-09-14 MED ORDER — LIDOCAINE HCL (CARDIAC) 20 MG/ML IV SOLN
INTRAVENOUS | Status: AC
  Filled 2015-09-14: qty 5

## 2015-09-14 MED ORDER — ROCURONIUM BROMIDE 100 MG/10ML IV SOLN
INTRAVENOUS | Status: DC | PRN
  Administered 2015-09-14 (×2): 5 mg via INTRAVENOUS
  Administered 2015-09-14 (×2): 10 mg via INTRAVENOUS
  Administered 2015-09-14: 30 mg via INTRAVENOUS

## 2015-09-14 MED ORDER — ROCURONIUM BROMIDE 100 MG/10ML IV SOLN
INTRAVENOUS | Status: AC
  Filled 2015-09-14: qty 4

## 2015-09-14 MED ORDER — ONDANSETRON HCL 4 MG/2ML IJ SOLN
INTRAMUSCULAR | Status: AC
  Filled 2015-09-14: qty 2

## 2015-09-14 MED ORDER — MIDAZOLAM HCL 5 MG/5ML IJ SOLN
INTRAMUSCULAR | Status: DC | PRN
  Administered 2015-09-14: 2 mg via INTRAVENOUS

## 2015-09-14 MED ORDER — BUPIVACAINE LIPOSOME 1.3 % IJ SUSP
20.0000 mL | Freq: Once | INTRAMUSCULAR | Status: AC
  Administered 2015-09-14: 20 mL
  Filled 2015-09-14: qty 20

## 2015-09-14 MED ORDER — LIDOCAINE HCL (CARDIAC) 20 MG/ML IV SOLN
INTRAVENOUS | Status: AC
  Filled 2015-09-14: qty 10

## 2015-09-14 MED ORDER — CEFOTETAN DISODIUM-DEXTROSE 2-2.08 GM-% IV SOLR
2.0000 g | INTRAVENOUS | Status: AC
  Administered 2015-09-14: 2 g via INTRAVENOUS

## 2015-09-14 MED ORDER — PANTOPRAZOLE SODIUM 40 MG IV SOLR
40.0000 mg | Freq: Every day | INTRAVENOUS | Status: DC
  Administered 2015-09-14 – 2015-09-15 (×2): 40 mg via INTRAVENOUS
  Filled 2015-09-14 (×3): qty 40

## 2015-09-14 SURGICAL SUPPLY — 61 items
APPLICATOR COTTON TIP 6IN STRL (MISCELLANEOUS) IMPLANT
APPLIER CLIP ROT 10 11.4 M/L (STAPLE)
BLADE SURG SZ11 CARB STEEL (BLADE) ×3 IMPLANT
CABLE HIGH FREQUENCY MONO STRZ (ELECTRODE) ×3 IMPLANT
CHLORAPREP W/TINT 26ML (MISCELLANEOUS) ×3 IMPLANT
CLIP APPLIE ROT 10 11.4 M/L (STAPLE) IMPLANT
COVER SURGICAL LIGHT HANDLE (MISCELLANEOUS) ×3 IMPLANT
DERMABOND ADVANCED (GAUZE/BANDAGES/DRESSINGS) ×1
DERMABOND ADVANCED .7 DNX12 (GAUZE/BANDAGES/DRESSINGS) ×2 IMPLANT
DEVICE SUT QUICK LOAD TK 5 (STAPLE) ×9 IMPLANT
DEVICE SUT TI-KNOT TK 5X26 (MISCELLANEOUS) ×3 IMPLANT
DEVICE SUTURE ENDOST 10MM (ENDOMECHANICALS) ×3 IMPLANT
DEVICE TROCAR PUNCTURE CLOSURE (ENDOMECHANICALS) IMPLANT
DISSECTOR BLUNT TIP ENDO 5MM (MISCELLANEOUS) ×3 IMPLANT
DRAPE UTILITY XL STRL (DRAPES) ×6 IMPLANT
ELECT REM PT RETURN 9FT ADLT (ELECTROSURGICAL) ×3
ELECTRODE REM PT RTRN 9FT ADLT (ELECTROSURGICAL) ×2 IMPLANT
GAUZE SPONGE 4X4 12PLY STRL (GAUZE/BANDAGES/DRESSINGS) IMPLANT
GLOVE BIO SURGEON STRL SZ7.5 (GLOVE) ×3 IMPLANT
GLOVE INDICATOR 8.0 STRL GRN (GLOVE) ×3 IMPLANT
GOWN STRL REUS W/TWL XL LVL3 (GOWN DISPOSABLE) ×9 IMPLANT
HOVERMATT SINGLE USE (MISCELLANEOUS) ×3 IMPLANT
KIT BASIN OR (CUSTOM PROCEDURE TRAY) ×3 IMPLANT
MARKER SKIN DUAL TIP RULER LAB (MISCELLANEOUS) ×3 IMPLANT
NEEDLE SPNL 22GX3.5 QUINCKE BK (NEEDLE) ×3 IMPLANT
PACK UNIVERSAL I (CUSTOM PROCEDURE TRAY) ×3 IMPLANT
RELOAD STAPLER 60MM BLK (STAPLE) ×4 IMPLANT
RELOAD STAPLER BLUE 60MM (STAPLE) ×2 IMPLANT
RELOAD STAPLER GOLD 60MM (STAPLE) ×2 IMPLANT
RELOAD STAPLER GREEN 60MM (STAPLE) ×2 IMPLANT
SCISSORS LAP 5X45 EPIX DISP (ENDOMECHANICALS) ×3 IMPLANT
SEALANT SURGICAL APPL DUAL CAN (MISCELLANEOUS) IMPLANT
SET IRRIG TUBING LAPAROSCOPIC (IRRIGATION / IRRIGATOR) ×3 IMPLANT
SHEARS HARMONIC ACE PLUS 45CM (MISCELLANEOUS) ×3 IMPLANT
SLEEVE ADV FIXATION 5X100MM (TROCAR) ×6 IMPLANT
SLEEVE GASTRECTOMY 40FR VISIGI (MISCELLANEOUS) ×3 IMPLANT
SLEEVE XCEL OPT CAN 5 100 (ENDOMECHANICALS) IMPLANT
SOLUTION ANTI FOG 6CC (MISCELLANEOUS) ×3 IMPLANT
SPONGE LAP 18X18 X RAY DECT (DISPOSABLE) ×3 IMPLANT
STAPLER ECHELON BIOABSB 60 FLE (MISCELLANEOUS) ×21 IMPLANT
STAPLER ECHELON LONG 60 440 (INSTRUMENTS) IMPLANT
STAPLER RELOAD 60MM BLK (STAPLE) ×6
STAPLER RELOAD BLUE 60MM (STAPLE) ×3
STAPLER RELOAD GOLD 60MM (STAPLE) ×3
STAPLER RELOAD GREEN 60MM (STAPLE) ×3
SUT MNCRL AB 4-0 PS2 18 (SUTURE) ×3 IMPLANT
SUT SURGIDAC NAB ES-9 0 48 120 (SUTURE) ×9 IMPLANT
SUT VICRYL 0 TIES 12 18 (SUTURE) ×3 IMPLANT
SYR 10ML ECCENTRIC (SYRINGE) ×3 IMPLANT
SYR 20CC LL (SYRINGE) ×6 IMPLANT
SYR 50ML LL SCALE MARK (SYRINGE) ×3 IMPLANT
TOWEL OR 17X26 10 PK STRL BLUE (TOWEL DISPOSABLE) ×3 IMPLANT
TOWEL OR NON WOVEN STRL DISP B (DISPOSABLE) ×3 IMPLANT
TRAY FOLEY W/METER SILVER 14FR (SET/KITS/TRAYS/PACK) IMPLANT
TRAY FOLEY W/METER SILVER 16FR (SET/KITS/TRAYS/PACK) IMPLANT
TROCAR ADV FIXATION 5X100MM (TROCAR) ×3 IMPLANT
TROCAR BLADELESS 15MM (ENDOMECHANICALS) ×3 IMPLANT
TROCAR BLADELESS OPT 5 100 (ENDOMECHANICALS) ×3 IMPLANT
TUBING CONNECTING 10 (TUBING) ×3 IMPLANT
TUBING ENDO SMARTCAP (MISCELLANEOUS) ×3 IMPLANT
TUBING INSUF HEATED (TUBING) ×3 IMPLANT

## 2015-09-14 NOTE — Interval H&P Note (Signed)
History and Physical Interval Note:  09/14/2015 7:13 AM  Ellouise Newer  has presented today for surgery, with the diagnosis of MORBID OBESITY  The various methods of treatment have been discussed with the patient and family. After consideration of risks, benefits and other options for treatment, the patient has consented to  Procedure(s): LAPAROSCOPIC GASTRIC SLEEVE RESECTION W/UPPER ENDO (N/A) as a surgical intervention .  The patient's history has been reviewed, patient examined, no change in status, stable for surgery.  I have reviewed the patient's chart and labs.  Questions were answered to the patient's satisfaction.    Possible hiatal hernia repair  Leighton Ruff. Redmond Pulling, MD, [place], Bariatric, & Minimally Invasive Surgery [facility], [place]   [facility]

## 2015-09-14 NOTE — Anesthesia Procedure Notes (Signed)
Procedure Name: Intubation Date/Time: 09/14/2015 7:23 AM Performed by: Prosperity Darrough, Virgel Gess Pre-anesthesia Checklist: Patient identified, Emergency Drugs available, Suction available and Patient being monitored Patient Re-evaluated:Patient Re-evaluated prior to inductionOxygen Delivery Method: Circle System Utilized Preoxygenation: Pre-oxygenation with 100% oxygen Intubation Type: IV induction Ventilation: Mask ventilation without difficulty Laryngoscope Size: Mac and 4 Grade View: Grade I Tube type: Oral Tube size: 7.5 mm Number of attempts: 1 Airway Equipment and Method: Stylet Placement Confirmation: ETT inserted through vocal cords under direct vision,  positive ETCO2 and breath sounds checked- equal and bilateral Secured at: 22 cm Tube secured with: Tape Dental Injury: Teeth and Oropharynx as per pre-operative assessment

## 2015-09-14 NOTE — Op Note (Signed)
09/14/2015 [place] 11-01-1955 [id number]   PRE-OPERATIVE DIAGNOSIS:     Obesity, Class III, BMI 40-49.9 (morbid obesity) ([place])   Diabetes mellitus type 2 in obese ([place])   Essential hypertension   Hypercholesterolemia   Hiatal hernia   Osteoarthritis of both knees  POST-OPERATIVE DIAGNOSIS:  same  PROCEDURE:  Procedure(s): LAPAROSCOPIC SLEEVE GASTRECTOMY WITH HIATAL HERNIA REPAIR UPPER GI ENDOSCOPY  SURGEON:  Surgeon(s): [name], MD FACS FASMBS  ASSISTANTS: Gurney Maxin MD  ANESTHESIA:   general  DRAINS: none   BOUGIE: 40 fr ViSiGi  LOCAL MEDICATIONS USED:  MARCAINE + Exparel  SPECIMEN:  Source of Specimen:  Greater curvature of stomach  DISPOSITION OF SPECIMEN:  PATHOLOGY  COUNTS:  YES  INDICATION FOR PROCEDURE: This is a very pleasant 60 year old morbidly obese female who has had unsuccessful attempts for sustained weight loss. She presents today for a planned laparoscopic sleeve gastrectomy with upper endoscopy & possible hiatal hernia repair. We have discussed the risk and benefits of the procedure extensively preoperatively. Please see my separate notes. Her preop UGI showed a small sliding hiatal hernia. She denied reflux preooperatively.  PROCEDURE: After obtaining informed consent and receiving 5000 units of subcutaneous heparin, the patient was brought to the operating room at [facility] and placed supine on the operating room table. General endotracheal anesthesia was established. Sequential compression devices were placed. A orogastric tube was placed. The patient's abdomen was prepped and draped in the usual standard surgical fashion. She received preoperative IV antibiotics. A surgical timeout was performed.  Access to the abdomen was achieved using a 5 mm 0 laparoscope thru a 5 mm trocar In the left upper Quadrant 2 fingerbreadths below the left subcostal margin using the Optiview technique. Pneumoperitoneum was smoothly established up  to 15 mm of mercury. The laparoscope was advanced and the abdominal cavity was surveilled. The patient was then placed in reverse Trendelenburg. There was evidence of a hiatal hernia on laparoscopy - gap in the left and right crus anteriorly.  A 5 mm trocar was placed slightly above and to the left of the umbilicus under direct visualization.  [facility] liver retractor was placed under the left lobe of the liver through a 5 mm trocar incision site in the subxiphoid position. A 5 mm trocar was placed in the lateral right upper quadrant along with a 15 mm trocar in the mid right abdomen. A final 5 mm trocar was placed in the lateral LUQ.  All under direct visualization after local had been infiltrated.  The stomach was inspected. It was completely decompressed and the orogastric tube was removed.  There was a small anterior dimple that was obviously visible. The calibration tube was placed in the oropharynx and guided down into the stomach by the CRNA. 10 mL of air was insufflated into the calibration balloon. The calibration tubing was then gently pulled back by the CRNA and it slid past the GE junction. At this point the calibration tubing was desufflated and pulled back into the esophagus. This confirmed my suspicion of a clinically significant hiatal hernia. The gastrohepatic ligament was incised with harmonic scalpel. The right crus was identified. We identified the crossing fat along the right crus. The adipose tissue just above this area was incised with harmonic scalpel. I then bluntly dissected out this area and identified the left crus. There was evidence of a hiatal hernia. It was larger than I thought it was going to be. I then mobilized the esophagus. The left  and right crus were further mobilized with blunt dissection. I was then able to reapproximate the left and right crus with 0 Ethibond using an Endostitch suture device and securing it with a titanium tyknot. I placed a second and third  suture in a similar fashion. The left crus was a little attenuated. We then had the CRNA readvanced the calibration tubing back into the stomach. 10 mL of air was insufflated into the calibration tube balloon. The calibration tube was then gently pulled back and there was resistance at the GE junction. The tube did not slide back up into the esophagus. At this point the calibration tubing was deflated and removed from the patient's body.   We identified the pylorus and measured 6 cm proximal to the pylorus and identified an area of where we would start taking down the short gastric vessels. Harmonic scalpel was used to take down the short gastric vessels along the greater curvature of the stomach. We were able to enter the lesser sac. We continued to march along the greater curvature of the stomach taking down the short gastrics. As we approached the gastrosplenic ligament we took care in this area not to injure the spleen. We were able to take down the entire gastrosplenic ligament. We then mobilized the fundus away from the left crus of diaphragm. There were not any significant posterior gastric avascular attachments. This left the stomach completely mobilized. No vessels had been taken down along the lesser curvature of the stomach.  We then reidentified the pylorus. A 40Fr ViSiGi was then placed in the oropharynx and advanced down into the stomach and placed in the distal antrum and positioned along the lesser curvature. It was placed under suction which secured the 40Fr ViSiGi in place along the lesser curve. Her stomach seemed a little thick. Then using the Ethicon echelon 60 mm stapler with a black load with Seamguard, I placed a stapler along the antrum approximately 5 cm from the pylorus. The stapler was angled so that there is ample room at the angularis incisura. I then fired the first staple load after inspecting it posteriorly to ensure adequate space both anteriorly and posteriorly. At this point  I still was not completely past the angularis so with another black load with Seamguard, I placed the stapler in position just inside the prior stapleline. We then rotated the stomach to insure that there was adequate anteriorly as well as posteriorly. The stapler was then fired. At this point I started used a 60 mm green load staple cartridges with Seamguard. The echelon stapler was then repositioned with a 60 mm green load with Seamguard and we continued to march up along the [place]. My assistant was holding traction along the greater curvature stomach along the cauterized short gastric vessels ensuring that the stomach was symmetrically retracted. Prior to each firing of the staple, we rotated the stomach to ensure that there is adequate stomach left.  As we approached the fundus, I used 60 mm gold & then blue cartridges with Seamguard aiming slightly lateral to the esophageal fat pad. The sleeve was inspected. There is no evidence of cork screw. She ended up having a fair amount of her antrum retained but I decided not to go back and take any. The staple line appeared hemostatic. The CRNA inflated the ViSiGi to the green zone and the upper abdomen was flooded with saline. There were no bubbles. The sleeve was decompressed and the ViSiGi removed. The greater curvature the stomach was grasped  with a laparoscopic grasper and removed from the 15 mm trocar site. My assistant scrubbed out and performed an upper endoscopy. The sleeve easily distended with air and the scope was easily advanced to the pylorus. There is no evidence of internal bleeding or cork screwing. There was no narrowing at the angularis. There is no evidence of bubbles. Please see his operative note for further details. The gastric sleeve was decompressed and the endoscope was removed.   The liver retractor was removed. I then closed the 15 mm trocar site with 1 interrupted 0 Vicryl sutures through the fascia using the endoclose. The closure was  viewed laparoscopically and it was airtight. 70 cc of Exparel was then infiltrated in the preperitoneal spaces around the trocar sites. Pneumoperitoneum was released. All trocar sites were closed with a 4-0 Monocryl in a subcuticular fashion followed by the application of Dermabond. The patient was extubated and taken to the recovery room in stable condition. All needle, instrument, and sponge counts were correct x2. There are no immediate complications  (2) 60 mm black with Seamguard (1) 60 mm green with seamguard (1) 60 mm gold with seamguard (1) 60 mm blue with seamguard  PLAN OF CARE: Admit to inpatient   PATIENT DISPOSITION:  PACU - hemodynamically stable.   Delay start of Pharmacological VTE agent (>24hrs) due to surgical blood loss or risk of bleeding:  no  Leighton Ruff. Redmond Pulling, MD, FACS FASMBS General, Bariatric, & Minimally Invasive Surgery [facility] [place] \

## 2015-09-14 NOTE — Transfer of Care (Signed)
Immediate Anesthesia Transfer of Care Note  Patient: Misty Bell  Procedure(s) Performed: Procedure(s): LAPAROSCOPIC GASTRIC SLEEVE RESECTION W/UPPER ENDO (N/A) LAPAROSCOPIC REPAIR OF HIATAL HERNIA  Patient Location: PACU  Anesthesia Type:General  Level of Consciousness:  sedated, patient cooperative and responds to stimulation  Airway & Oxygen Therapy:Patient Spontanous Breathing and Patient connected to face mask oxgen  Post-op Assessment:  Report given to PACU RN and Post -op Vital signs reviewed and stable  Post vital signs:  Reviewed and stable  Last Vitals:  Filed Vitals:   09/14/15 0530  BP: 117/75  Pulse: 97  Temp: 36.7 C  Resp: 18    Complications: No apparent anesthesia complications

## 2015-09-14 NOTE — Progress Notes (Signed)
5W  Aware pt will be in 1531 in 20 minutes.

## 2015-09-14 NOTE — H&P (View-Only) (Signed)
[place] 08/27/2015 4:13 PM Location: [facility] Patient #: [id number] DOB: [birth year] Divorced / Language: English / Race: Black or African American Female  History of Present Illness Randall Hiss M. Arieal Cuoco MD; 08/27/2015 5:23 PM) The patient is a 60 year old female who presents for a pre-op visit. She comes in today for her preoperative visit. She has been scheduled and approved for laparoscopic sleeve gastrectomy with possible hiatal hernia repair. She denies any medical changes since she was last seen. However her primary care physician passed away and she is essentially out of her blood pressure medications. She denies any chest pain, chest pressure, shortness of breath, dyspnea on exertion, orthopnea or paroxysmal nocturnal dyspnea. She denies any TIAs or amaurosis fugax. She denies any reflux. She did see the cardiologist and was cleared for surgery. The cardiologist did put her on a statin medication. Her preoperative workup was not terribly surprising. Her upper GI showed a small sliding hiatal hernia. Her hemoglobin A1c was 7.26 she is no longer considered prediabetic. Her total cholesterol level was 293, triglyceride level 109, LDL level 209. Her comprehensive metabolic panel and CBC were within normal limits.   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 Lasix (10MG /ML Solution, Injection) Active. Potassium Chloride (20MEQ Tablet ER, Oral) Active. Medications Reconciled Lisinopril (5MG  Tablet, Oral) Active. Pravastatin Sodium (20MG  Tablet, Oral) Active. Potassium (75MG  Tablet, Oral) Active. Klor-[facility] Packet, Oral) Active. OxyCODONE HCl (5MG /5ML Solution, 5-10 Milliliter Oral every four hours, as needed, Taken starting 08/27/2015) Active. Ondansetron (4MG  Tablet Disperse, 1 (one) Tablet Oral every six hours, as needed, Taken starting 08/27/2015) Active. Pantoprazole Sodium (40MG  Tablet DR, 1 (one) Tablet Oral daily, Taken starting 08/27/2015) Active.  Social History [name], MD; 08/27/2015 5:29 PM) Tobacco use Former smoker. Alcohol use Moderate alcohol use. Caffeine use Coffee, Tea. Illicit drug use Remotely quit drug use.  Family History [name], MD; 08/27/2015 5:29 PM) Cervical Cancer Mother. Diabetes Mellitus Family Members In General. Heart Disease Family Members In General, Mother. Heart disease in female family member before age 54  Pregnancy / Birth History [name], MD; 08/27/2015 5:29 PM) Durenda Age 4 Maternal age 48-20 Para 3 Contraceptive History Oral contraceptives. Age at menarche 55 years. Age of menopause 51-55 Regular periods     Review of Systems Randall Hiss M. Simranjit Thayer MD; 08/27/2015 5:23 PM) General Present- Fatigue, Night Sweats and Weight Gain. Not Present- Appetite Loss, Chills, Fever and Weight Loss. Skin Present- Dryness, New Lesions and Non-Healing Wounds. Not Present- Change in Wart/Mole, Hives, Jaundice, Rash and Ulcer. HEENT Present- Wears glasses/contact lenses. Not Present- Earache, Hearing Loss, Hoarseness, Nose Bleed, Oral Ulcers, Ringing in the Ears, Seasonal Allergies, Sinus Pain, Sore Throat, Visual  Disturbances and Yellow Eyes. Respiratory Present- Wheezing. Not Present- Bloody sputum, Chronic Cough, Difficulty Breathing and Snoring. Cardiovascular Present- Leg Cramps,  Shortness of Breath and Swelling of Extremities. Not Present- Chest Pain, Difficulty Breathing Lying Down, Palpitations and Rapid Heart Rate. Gastrointestinal Present- Bloating, Constipation and Gets full quickly at meals. Not Present- Abdominal Pain, Bloody Stool, Change in Bowel Habits, Chronic diarrhea, Difficulty Swallowing, Excessive gas, Hemorrhoids, Indigestion, Nausea, Rectal Pain and Vomiting. Female Genitourinary Present- Frequency, Nocturia and Urgency. Not Present- Painful Urination and Pelvic Pain. Musculoskeletal Present- Back Pain, Joint Pain, Joint Stiffness and Swelling of Extremities. Not Present- Muscle Pain and Muscle Weakness. Neurological Present- Numbness, Tingling, Trouble walking and Weakness. Not Present- Decreased Memory, Fainting, Headaches, Seizures and Tremor. Psychiatric Present- Change in Sleep Pattern. Not Present- Anxiety, Bipolar, Depression, Fearful and Frequent crying. Endocrine Present- Hot flashes. Not Present- Cold Intolerance, Excessive Hunger, Hair Changes, Heat Intolerance and New Diabetes. Hematology Present- Easy Bruising. Not Present- Excessive bleeding, Gland problems, HIV and Persistent Infections.  Vitals Elbert Ewings CMA; 08/27/2015 4:14 PM) 08/27/2015 4:13 PM Weight: 247.6 lb Height: 63in Body Surface Area: 2.12 m Body Mass Index: 43.86 kg/m  Temp.: 97.24F(Temporal)  Pulse: 80 (Regular)  BP: 130/78 (Sitting, Left Arm, Standard)      Physical Exam Randall Hiss M. Gayatri Teasdale MD; 08/27/2015 5:24 PM)  General Mental Status-Alert. General Appearance-Consistent with stated age. Hydration-Well hydrated. Voice-Normal. Note: morbidly obese  Head and Neck Head-normocephalic, atraumatic with no lesions or palpable masses. Trachea-midline. Thyroid Gland  Characteristics - normal size and consistency.  Eye Eyeball - Bilateral-Extraocular movements intact. Sclera/Conjunctiva - Bilateral-No scleral icterus.  Chest and Lung Exam Chest and lung exam reveals -quiet, even and easy respiratory effort with no use of accessory muscles and on auscultation, normal breath sounds, no adventitious sounds and normal vocal resonance. Inspection Chest Wall - Normal. Back - normal.  Breast - Did not examine.  Cardiovascular Cardiovascular examination reveals -normal heart sounds, regular rate and rhythm with no murmurs and normal pedal pulses bilaterally.  Abdomen Inspection Inspection of the abdomen reveals - No Hernias. Skin - Scar - no surgical scars. Palpation/Percussion Palpation and Percussion of the abdomen reveal - Soft, Non Tender, No Rebound tenderness, No Rigidity (guarding) and No hepatosplenomegaly. Auscultation Auscultation of the abdomen reveals - Bowel sounds normal.  Peripheral Vascular Upper Extremity Palpation - Pulses bilaterally normal.  Neurologic Neurologic evaluation reveals -alert and oriented x 3 with no impairment of recent or remote memory. Mental Status-Normal.  Neuropsychiatric The patient's mood and affect are described as -normal. Judgment and Insight-insight is appropriate concerning matters relevant to self.  Musculoskeletal Normal Exam - Left-Upper Extremity Strength Normal and Lower Extremity Strength Normal. Normal Exam - Right-Upper Extremity Strength Normal and Lower Extremity Strength Normal. Note: bilateral knee crepitus  Lymphatic Head & Neck  General Head & Neck Lymphatics: Bilateral - Description - Normal. Axillary - Did not examine. Femoral & Inguinal - Did not examine.    Assessment & Plan Randall Hiss M. Brithany Whitworth MD; 08/27/2015 5:29 PM)  OBESITY, MORBID, BMI 40.0-49.9 Impression: We reviewed her preoperative workup. We discussed the findings of a hiatal hernia. We discussed  that we would test for one intraoperatively. If she was found to have a clinically significant one I recommended interoperative repair. We discussed what that would involve. We discussed the risk and potential complications associated with that repair. We also discussed potentially complications that can occur without repairing it. She was given her postoperative pain medicine prescription, nausea and heartburn medication prescriptions today. All of her questions were asked and answered. A total of 30 minutes was spent counseling the patient. We rediscussed the typical postoperative  pathway. Because she is out of her prescriptions I will refill her blood pressure and cholesterol prescriptions until she is able to locate a new primary care physician  Current Plans Pt Education - EMW_preopbariatric Started OxyCODONE HCl 5MG /5ML, 5-10 Milliliter every four hours, as needed, 200 Milliliter, 08/27/2015, No Refill. Started Ondansetron 4MG , 1 (one) Tablet every six hours, as needed, #30, 08/27/2015, No Refill. Started Pantoprazole Sodium 40MG , 1 (one) Tablet daily, #30, 08/27/2015, No Refill. 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 Started AmLODIPine Besylate 10MG , 1 (one) Tablet daily, #30, 08/27/2015, No Refill. Started Potassium Chloride Crys ER 20MEQ, 1 (one) Tablet daily, #30, 08/27/2015, No Refill. Started Lasix 20MG ,  (one half) Tablet daily, #30, 08/27/2015, No Refill. DYSLIPIDEMIA (E78.5) Impression: See above  Current Plans Started Pravastatin Sodium 20MG , 1 (one) Tablet daily, #30, 08/27/2015, No Refill. HIATAL HERNIA (K44.9) Impression: see above discussion  Leighton Ruff. Redmond Pulling, MD, FACS General, Bariatric, & Minimally Invasive Surgery [facility], [place]

## 2015-09-14 NOTE — Anesthesia Postprocedure Evaluation (Signed)
Anesthesia Post Note  Patient: Misty Bell  Procedure(s) Performed: Procedure(s) (LRB): LAPAROSCOPIC GASTRIC SLEEVE RESECTION W/UPPER ENDO (N/A) LAPAROSCOPIC REPAIR OF HIATAL HERNIA  Patient location during evaluation: PACU Anesthesia Type: General Level of consciousness: awake and alert Pain management: pain level controlled Vital Signs Assessment: post-procedure vital signs reviewed and stable Respiratory status: spontaneous breathing, nonlabored ventilation, respiratory function stable and patient connected to nasal cannula oxygen Cardiovascular status: blood pressure returned to baseline and stable Postop Assessment: no signs of nausea or vomiting Anesthetic complications: no    Last Vitals:  Filed Vitals:   09/14/15 1145 09/14/15 1246  BP: 149/71 143/71  Pulse: 111 110  Temp: 36.6 C 36.4 C  Resp: 16 16    Last Pain:  Filed Vitals:   09/14/15 1303  PainSc: 5                  Ahnna Dungan L

## 2015-09-15 ENCOUNTER — Inpatient Hospital Stay (HOSPITAL_COMMUNITY): Payer: BC Managed Care – PPO

## 2015-09-15 LAB — CBC WITH DIFFERENTIAL/PLATELET
BASOS ABS: 0 10*3/uL (ref 0.0–0.1)
BASOS PCT: 0 %
EOS ABS: 0 10*3/uL (ref 0.0–0.7)
Eosinophils Relative: 0 %
HEMATOCRIT: 46.4 % — AB (ref 36.0–46.0)
Hemoglobin: 15.2 g/dL — ABNORMAL HIGH (ref 12.0–15.0)
Lymphocytes Relative: 21 %
Lymphs Abs: 3.5 10*3/uL (ref 0.7–4.0)
MCH: 29.2 pg (ref 26.0–34.0)
MCHC: 32.8 g/dL (ref 30.0–36.0)
MCV: 89.2 fL (ref 78.0–100.0)
MONO ABS: 1 10*3/uL (ref 0.1–1.0)
Monocytes Relative: 6 %
NEUTROS ABS: 12.2 10*3/uL — AB (ref 1.7–7.7)
NEUTROS PCT: 73 %
Platelets: 342 10*3/uL (ref 150–400)
RBC: 5.2 MIL/uL — ABNORMAL HIGH (ref 3.87–5.11)
RDW: 13.8 % (ref 11.5–15.5)
WBC: 16.7 10*3/uL — ABNORMAL HIGH (ref 4.0–10.5)

## 2015-09-15 LAB — COMPREHENSIVE METABOLIC PANEL
ALBUMIN: 4 g/dL (ref 3.5–5.0)
ALT: 55 U/L — ABNORMAL HIGH (ref 14–54)
AST: 57 U/L — AB (ref 15–41)
Alkaline Phosphatase: 79 U/L (ref 38–126)
Anion gap: 10 (ref 5–15)
BILIRUBIN TOTAL: 0.6 mg/dL (ref 0.3–1.2)
BUN: 9 mg/dL (ref 6–20)
CHLORIDE: 101 mmol/L (ref 101–111)
CO2: 25 mmol/L (ref 22–32)
Calcium: 9.3 mg/dL (ref 8.9–10.3)
Creatinine, Ser: 0.77 mg/dL (ref 0.44–1.00)
GFR calc Af Amer: >60 mL/min (ref >60)
GFR calc non Af Amer: >60 mL/min (ref >60)
GLUCOSE: 142 mg/dL — AB (ref 65–99)
POTASSIUM: 4 mmol/L (ref 3.5–5.1)
SODIUM: 136 mmol/L (ref 135–145)
Total Protein: 7.9 g/dL (ref 6.5–8.1)

## 2015-09-15 LAB — HEMOGLOBIN AND HEMATOCRIT, BLOOD
HCT: 42.1 % (ref 36.0–46.0)
Hemoglobin: 14.7 g/dL (ref 12.0–15.0)

## 2015-09-15 MED ORDER — ALUM & MAG HYDROXIDE-SIMETH 200-200-20 MG/5ML PO SUSP: 15.0000 mL | Freq: Four times a day (QID) | ORAL | Status: DC | PRN

## 2015-09-15 MED ORDER — IOHEXOL 300 MG/ML  SOLN
50.0000 mL | Freq: Once | INTRAMUSCULAR | Status: AC | PRN
  Administered 2015-09-15: 50 mL via ORAL

## 2015-09-15 NOTE — Plan of Care (Signed)
Problem: Food- and Nutrition-Related Knowledge Deficit (NB-1.1) Goal: Nutrition education Formal process to instruct or train a patient/client in a skill or to impart knowledge to help patients/clients voluntarily manage or modify food choices and eating behavior to maintain or improve health. Outcome: Completed/Met Date Met:  09/15/15 Nutrition Education Note  Received consult for diet education per DROP protocol.   Discussed 2 week post op diet with pt. Emphasized that liquids must be non carbonated, non caffeinated, and sugar free. Fluid goals discussed. Reviewed progression of diet to include soft proteins at 7-10 days post-op. Pt to follow up with outpatient bariatric RD for further diet progression after 2 weeks. Multivitamins and minerals also reviewed. Teach back method used, pt expressed understanding, expect good compliance.   Diet: First 2 Weeks  You will see the dietitian about two (2) weeks after your surgery. The dietitian will increase the types of foods you can eat if you are handling liquids well:  If you have severe vomiting or nausea and cannot handle clear liquids lasting longer than 1 day, call your surgeon  Protein Shake  Drink at least 2 ounces of shake 5-6 times per day  Each serving of protein shakes (usually 8 - 12 ounces) should have a minimum of:  15 grams of protein  And no more than 5 grams of carbohydrate  Goal for protein each day:  Men = 80 grams per day  Women = 60 grams per day  Protein powder may be added to fluids such as non-fat milk or Lactaid milk or Soy milk (limit to 35 grams added protein powder per serving)   Hydration  Slowly increase the amount of water and other clear liquids as tolerated (See Acceptable Fluids)  Slowly increase the amount of protein shake as tolerated  Sip fluids slowly and throughout the day  May use sugar substitutes in small amounts (no more than 6 - 8 packets per day; i.e. Splenda)   Fluid Goal  The first goal is to  drink at least 8 ounces of protein shake/drink per day (or as directed by the nutritionist); some examples of protein shakes are [employer], [employer], EAS Edge HP, and Unjury. See handout from pre-op Bariatric Education Class:  Slowly increase the amount of protein shake you drink as tolerated  You may find it easier to slowly sip shakes throughout the day  It is important to get your proteins in first  Your fluid goal is to drink 64 - 100 ounces of fluid daily  It may take a few weeks to build up to this  32 oz (or more) should be clear liquids  And  32 oz (or more) should be full liquids (see below for examples)  Liquids should not contain sugar, caffeine, or carbonation   Clear Liquids:  Water or Sugar-free flavored water (i.e. Fruit H2O, Propel)  Decaffeinated coffee or tea (sugar-free)  Crystal Lite, Wyler's Lite, Minute Maid Lite  Sugar-free Jell-O  Bouillon or broth  Sugar-free Popsicle: *Less than 20 calories each; Limit 1 per day   Full Liquids:  Protein Shakes/Drinks + 2 choices per day of other full liquids  Full liquids must be:  No More Than 12 grams of Carbs per serving  No More Than 3 grams of Fat per serving  Strained low-fat cream soup  Non-Fat milk  Fat-free Lactaid Milk  Sugar-free yogurt (Dannon Lite & Fit, Greek yogurt)     Clayton Bibles, MS, RD, LDN Pager: [phone] After Hours Pager: [phone]

## 2015-09-15 NOTE — Op Note (Signed)
Preoperative diagnosis: laparoscopic sleeve gastrectomy  Postoperative diagnosis: Same   Procedure: Upper endoscopy   Surgeon: Gurney Maxin, M.D.  Anesthesia: Gen.   Indications for procedure: This patient was undergoing a laparoscopic sleeve gastrectomy.   Description of procedure: The endoscopy was placed in the mouth and into the oropharynx and under endoscopic vision it was advanced to the esophagogastric junction. The pouch was insufflated and no bleeding or bubbles were seen. The GEJ was identified at 34cm from the teeth. No leaks were identified. There was some directional change in the distal esophagus. There was no spiraling of the staple line and the angularis was widely patent. No bleeding or leaks were detected. The scope was withdrawn without difficulty.   Gurney Maxin, M.D. General, Bariatric, & Minimally Invasive Surgery [facility] [place]

## 2015-09-15 NOTE — Progress Notes (Signed)
1 Day Post-Op  Subjective: A little nausea. Pain well controlled. Ambulated several times.   Objective: Vital signs in last 24 hours: Temp:  [97.4 F (36.3 C)-99.4 F (37.4 C)] 99.4 F (37.4 C) (03/21 0447) Pulse Rate:  [103-118] 115 (03/21 0447) Resp:  [16-25] 17 (03/21 0447) BP: (132-163)/(70-93) 132/72 mmHg (03/21 0447) SpO2:  [96 %-100 %] 98 % (03/21 0447) Weight:  [107.276 kg (236 lb 8 oz)] 107.276 kg (236 lb 8 oz) (03/21 0555) Last BM Date: 09/13/15  Intake/Output from previous day: 03/20 0701 - 03/21 0700 In: 1500 [I.V.:1500] Out: 3600 [Urine:3550; Blood:50] Intake/Output this shift:    Alert, nontoxic, looks very comfortable cta b/l Mild tachy Soft, approp tenderness, bruising around some incisions No edema  Lab Results:   Recent Labs  09/14/15 1013 09/15/15 0504  WBC  --  16.7*  HGB 15.0 15.2*  HCT 45.4 46.4*  PLT  --  342   BMET  Recent Labs  09/15/15 0504  NA 136  K 4.0  CL 101  CO2 25  GLUCOSE 142*  BUN 9  CREATININE 0.77  CALCIUM 9.3   PT/INR No results for input(s): LABPROT, INR in the last 72 hours. ABG No results for input(s): PHART, HCO3 in the last 72 hours.  Invalid input(s): PCO2, PO2  Studies/Results: No results found.  Anti-infectives: Anti-infectives    Start     Dose/Rate Route Frequency Ordered Stop   09/14/15 0530  cefoTEtan in Dextrose 5% (CEFOTAN) IVPB 2 g     2 g Intravenous On call to O.R. 09/14/15 0530 09/14/15 0734      Assessment/Plan: s/p Procedure(s): LAPAROSCOPIC GASTRIC SLEEVE RESECTION W/UPPER ENDO (N/A) LAPAROSCOPIC REPAIR OF HIATAL HERNIA  Principal Problem:   Obesity, Class III, BMI 40-49.9 (morbid obesity) ([place]) Active Problems:   Diabetes mellitus type 2 in obese [facility])   Essential hypertension   Hypercholesterolemia   Hiatal hernia   Osteoarthritis of both knees  Tachycardia ever since surgery. Will monitor HR. Looks very comfortable.  Postop UGI pending this am; assuming no issues will  start POD 1 diet Ambulate, pulm toilet Chemical vte prophylaxis BP for most part ok  Leighton Ruff. Redmond Pulling, MD, FACS General, Bariatric, & Minimally Invasive Surgery [facility], [place]   LOS: 1 day    [name] 09/15/2015

## 2015-09-16 LAB — CBC WITH DIFFERENTIAL/PLATELET
Basophils Absolute: 0 10*3/uL (ref 0.0–0.1)
Basophils Relative: 0 %
EOS ABS: 0.2 10*3/uL (ref 0.0–0.7)
Eosinophils Relative: 2 %
HEMATOCRIT: 41.5 % (ref 36.0–46.0)
HEMOGLOBIN: 13.5 g/dL (ref 12.0–15.0)
LYMPHS ABS: 2.9 10*3/uL (ref 0.7–4.0)
LYMPHS PCT: 23 %
MCH: 28.9 pg (ref 26.0–34.0)
MCHC: 32.5 g/dL (ref 30.0–36.0)
MCV: 88.9 fL (ref 78.0–100.0)
MONOS PCT: 7 %
Monocytes Absolute: 0.8 10*3/uL (ref 0.1–1.0)
NEUTROS ABS: 8.4 10*3/uL — AB (ref 1.7–7.7)
NEUTROS PCT: 68 %
Platelets: 283 10*3/uL (ref 150–400)
RBC: 4.67 MIL/uL (ref 3.87–5.11)
RDW: 13.9 % (ref 11.5–15.5)
WBC: 12.3 10*3/uL — AB (ref 4.0–10.5)

## 2015-09-16 NOTE — Progress Notes (Signed)
Pt's vitals are WNL, tolerating bariatric full liquids and pain is under control. Discussed discharged instructions with patient. Discharged to home. Patient already has prescriptions filled and at home.

## 2015-09-16 NOTE — Care Management Note (Signed)
Case Management Note  Patient Details  Name: CLELA TAYS MRN: [id number] Date of Birth: 1955/07/03  Subjective/Objective:    S/p Laparoscopic Gastric Sleeve Resection and an EGD                 Action/Plan: Discharge planning, no HH needs identified  Expected Discharge Date:                  Expected Discharge Plan:  Home/Self Care  In-House Referral:  NA  Discharge planning Services  CM Consult  Post Acute Care Choice:  NA Choice offered to:  NA  DME Arranged:  N/A DME Agency:  NA  HH Arranged:  NA HH Agency:  NA  Status of Service:  Completed, signed off  Medicare Important Message Given:    Date Medicare IM Given:    Medicare IM give by:    Date Additional Medicare IM Given:    Additional Medicare Important Message give by:     If discussed at [place] of Stay Meetings, dates discussed:    Additional Comments:  Guadalupe Maple, RN 09/16/2015, 11:20 AM [phone]

## 2015-09-16 NOTE — Progress Notes (Signed)
Patient alert and oriented, pain is controlled. Patient is tolerating fluids, advanced to protein shake today, patient is tolerating well.  Reviewed Gastric sleeve discharge instructions with patient and patient is able to articulate understanding.  Provided information on BELT program, Support Group and [facility] outpatient pharmacy. All questions answered, will continue to monitor.  

## 2015-09-16 NOTE — Discharge Summary (Signed)
Physician Discharge Summary  Misty Bell [place] [id number] DOB: 03-21-1956 DOA: 09/14/2015  PCP: Pcp Not In System  Admit date: 09/14/2015 Discharge date: 09/16/2015  Recommendations for Outpatient Follow-up:   Follow-up Information    Follow up with Misty Curry, MD. Go on 09/30/2015.   Specialty:  General Surgery   Why:  For Post-Op Check at 9:30   Contact information:   [address]  [place] [address] [phone]       Follow up with Misty Curry, MD. Go on 11/18/2015.   Specialty:  General Surgery   Why:  For Post-Op Check at 9:30   Contact information:   [name] [address] [phone]      Discharge Diagnoses:  Principal Problem:   Obesity, Class III, BMI 40-49.9 (morbid obesity) ([place]) Active Problems:   Diabetes mellitus type 2 in obese [facility])   Essential hypertension   Hypercholesterolemia   Hiatal hernia   Osteoarthritis of both knees   Surgical Procedure: Laparoscopic Sleeve Gastrectomy with hiatal hernia repair , upper endoscopy  Discharge Condition: Good Disposition: Home  Diet recommendation: Postoperative sleeve gastrectomy diet (liquids only)  Filed Weights   09/14/15 0530 09/15/15 0555 09/16/15 0500  Weight: 109.544 kg (241 lb 8 oz) 107.276 kg (236 lb 8 oz) 106.867 kg (235 lb 9.6 oz)     Hospital Course:  The patient was admitted for a planned laparoscopic sleeve gastrectomy. Please see operative note. Preoperatively the patient was given 5000 units of subcutaneous heparin for DVT prophylaxis. Postoperative prophylactic Lovenox dosing was started on the morning of postoperative day 1. The patient underwent an upper GI on postoperative day 1 which demonstrated no extravasation of contrast and emptying of the contrast into the small bowel. The patient was started on ice chips and water which they tolerated. On postoperative day 2 The patient's diet was advanced to protein shakes which they also tolerated.  The patient was ambulating without difficulty. Their vital signs are stable without fever or tachycardia. Their hemoglobin had remained stable. It was felt that her oral intake wsa safe for discharge.  The patient had received discharge instructions and counseling. They were deemed stable for discharge.  BP 137/89 mmHg  Pulse 95  Temp(Src) 99 F (37.2 C) (Oral)  Resp 18  Ht 5\' 3"  (1.6 m)  Wt 106.867 kg (235 lb 9.6 oz)  BMI 41.74 kg/m2  SpO2 97%  Gen: alert, NAD, non-toxic appearing Pupils: equal, no scleral icterus Pulm: Lungs clear to auscultation, symmetric chest rise CV: regular rate and rhythm Abd: soft, min to no tender, nondistended. Some bruising.  No cellulitis. No incisional hernia Ext: no edema, no calf tenderness Skin: no rash, no jaundice   Discharge Instructions  Discharge Instructions    Ambulate hourly while awake    Complete by:  As directed      Call MD for:  difficulty breathing, headache or visual disturbances    Complete by:  As directed      Call MD for:  persistant dizziness or light-headedness    Complete by:  As directed      Call MD for:  persistant nausea and vomiting    Complete by:  As directed      Call MD for:  redness, tenderness, or signs of infection (pain, swelling, redness, odor or green/yellow discharge around incision site)    Complete by:  As directed      Call MD for:  severe uncontrolled pain  Complete by:  As directed      Call MD for:  temperature >101 F    Complete by:  As directed      Diet bariatric full liquid    Complete by:  As directed      Discharge instructions    Complete by:  As directed   See bariatric discharge instructions     Incentive spirometry    Complete by:  As directed   Perform hourly while awake            Medication List    STOP taking these medications        lisinopril 5 MG tablet  Commonly known as:  PRINIVIL,ZESTRIL      TAKE these medications        acetaminophen 325 MG tablet   Commonly known as:  TYLENOL  Take 650 mg by mouth every 6 (six) hours as needed.     amLODipine 10 MG tablet  Commonly known as:  NORVASC  Take 10 mg by mouth daily.  Notes to Patient:  Monitor Blood Pressure Daily and keep a log for primary care physician.  You may need to make changes to your medications with rapid weight loss.        furosemide 20 MG tablet  Commonly known as:  LASIX  Take 10 mg by mouth daily.  Notes to Patient:  Monitor Blood Pressure Daily and keep a log for primary care physician.  Monitor for symptoms of dehydration.  You may need to make changes to your medications with rapid weight loss.        ondansetron 4 MG tablet  Commonly known as:  ZOFRAN  Take 4 mg by mouth every 8 (eight) hours as needed for nausea or vomiting. Reported on 09/02/2015     oxycodone 5 MG capsule  Commonly known as:  OXY-IR  Take 5 mg by mouth every 4 (four) hours as needed.     pantoprazole 40 MG tablet  Commonly known as:  PROTONIX  Take 1 tablet by mouth daily. Reported on 09/02/2015     Potassium Chloride ER 20 MEQ Tbcr  Take 1 tablet by mouth daily.  Notes to Patient:  This medication cannot be crushed.  It can be cut in half at the score mark, but if too large to swallow will need to dissolve in water.     rosuvastatin 10 MG tablet  Commonly known as:  CRESTOR  Take 1 tablet (10 mg total) by mouth daily.           Follow-up Information    Follow up with Misty Curry, MD. Go on 09/30/2015.   Specialty:  General Surgery   Why:  For Post-Op Check at 9:30   Contact information:   [address] [place] [address] [phone]       Follow up with Misty Curry, MD. Go on 11/18/2015.   Specialty:  General Surgery   Why:  For Post-Op Check at 9:30   Contact information:   [place] [address] [phone]        The results of significant diagnostics from this hospitalization (including imaging, microbiology, ancillary and  laboratory) are listed below for reference.    Significant Diagnostic Studies: Dg Ugi W/water Sol Cm  09/15/2015  CLINICAL DATA:  60 year old female post gastric sleeve/hiatal hernia repair. Subsequent encounter. EXAM: WATER SOLUBLE UPPER GI SERIES TECHNIQUE: Single-column upper GI series was performed using water  soluble contrast. CONTRAST:  44mL OMNIPAQUE IOHEXOL 300 MG/ML  SOLN COMPARISON:  04/09/2015. FLUOROSCOPY TIME:  Radiation Exposure Index (as provided by the fluoroscopic device): 87.4 micro Gray Fluoroscopy Time (in minutes and seconds):  48 seconds. FINDINGS: Post gastric sleeve procedure and hiatal hernia repair without evidence of holdup of ingested contrast or leak. IMPRESSION: Post gastric sleeve procedure and hiatal hernia repair without evidence of holdup of ingested contrast or leak. Electronically Signed   By: Genia Del M.D.   On: 09/15/2015 10:57    Labs: Basic Metabolic Panel:  Recent Labs Lab 09/15/15 0504  NA 136  K 4.0  CL 101  CO2 25  GLUCOSE 142*  BUN 9  CREATININE 0.77  CALCIUM 9.3   Liver Function Tests:  Recent Labs Lab 09/15/15 0504  AST 57*  ALT 55*  ALKPHOS 79  BILITOT 0.6  PROT 7.9  ALBUMIN 4.0    CBC:  Recent Labs Lab 09/14/15 1013 09/15/15 0504 09/15/15 1720 09/16/15 0429  WBC  --  16.7*  --  12.3*  NEUTROABS  --  12.2*  --  8.4*  HGB 15.0 15.2* 14.7 13.5  HCT 45.4 46.4* 42.1 41.5  MCV  --  89.2  --  88.9  PLT  --  342  --  283    CBG:  Recent Labs Lab 09/14/15 0533 09/14/15 0937  GLUCAP 105* 191*    Principal Problem:   Obesity, Class III, BMI 40-49.9 (morbid obesity) ([place]) Active Problems:   Diabetes mellitus type 2 in obese ([place])   Essential hypertension   Hypercholesterolemia   Hiatal hernia   Osteoarthritis of both knees   Time coordinating discharge: 15 minutes  Signed:  [name], MD [facility], [place] [phone] 09/16/2015, 11:59 AM

## 2015-09-16 NOTE — Discharge Instructions (Signed)

## 2015-09-21 ENCOUNTER — Telehealth (HOSPITAL_COMMUNITY): Payer: Self-pay

## 2015-09-21 NOTE — Telephone Encounter (Signed)
Attempted DC call, no answer, left message to return call   Made discharge phone call to patient per DROP protocol. Asking the following questions.    1. Do you have someone to care for you now that you are home?   2. Are you having pain now that is not relieved by your pain medication?   3. Are you able to drink the recommended daily amount of fluids (48 ounces minimum/day) and protein (60-80 grams/day) as prescribed by the dietitian or nutritional counselor?   4. Are you taking the vitamins and minerals as prescribed?   5. Do you have the "on call" number to contact your surgeon if you have a problem or question?   6. Are your incisions free of redness, swelling or drainage? (If steri strips, address that these can fall off, shower as tolerated)  7. Have your bowels moved since your surgery?  If not, are you passing gas?   8. Are you up and walking 3-4 times per day?

## 2015-09-21 NOTE — Telephone Encounter (Signed)
Patient returned call  Made discharge phone call to patient per DROP protocol. Asking the following questions.    1. Do you have someone to care for you now that you are home?  yes 2. Are you having pain now that is not relieved by your pain medication?  no 3. Are you able to drink the recommended daily amount of fluids (48 ounces minimum/day) and protein (60-80 grams/day) as prescribed by the dietitian or nutritional counselor?  yes 4. Are you taking the vitamins and minerals as prescribed?  yes 5. Do you have the "on call" number to contact your surgeon if you have a problem or question?  yes 6. Are your incisions free of redness, swelling or drainage? (If steri strips, address that these can fall off, shower as tolerated) yes 7. Have your bowels moved since your surgery?  If not, are you passing gas?  yes 8. Are you up and walking 3-4 times per day?  yes

## 2015-09-29 ENCOUNTER — Encounter: Payer: BC Managed Care – PPO | Attending: General Surgery

## 2015-09-29 DIAGNOSIS — R0683 Snoring: Secondary | ICD-10-CM | POA: Insufficient documentation

## 2015-09-29 DIAGNOSIS — Z87891 Personal history of nicotine dependence: Secondary | ICD-10-CM | POA: Diagnosis not present

## 2015-09-29 DIAGNOSIS — M170 Bilateral primary osteoarthritis of knee: Secondary | ICD-10-CM | POA: Diagnosis present

## 2015-09-29 DIAGNOSIS — Z6841 Body Mass Index (BMI) 40.0 and over, adult: Secondary | ICD-10-CM | POA: Insufficient documentation

## 2015-09-29 NOTE — Progress Notes (Signed)
Bariatric Class:  Appt start time: 1530 end time:  1630.  2 Week Post-Operative Nutrition Class  Patient was seen on 09/29/15 for Post-Operative Nutrition education at [facility].   Surgery date: 09/14/2015 Surgery type: Sleeve gastrectomy Start weight at [facility]: 246 lbs on 05/07/2015 Weight today: 231.5 lbs  Weight change: 10.6 lbs  TANITA  BODY COMP RESULTS  09/07/15 09/29/15   BMI (kg/m^2) N/A 41.0   Fat Mass (lbs)  109.5   Fat Free Mass (lbs)  122.0   Total Body Water (lbs)  89.5    The following the learning objectives were met by the patient during this course:  Identifies Phase 3A (Soft, High Proteins) Dietary Goals and will begin from 2 weeks post-operatively to 2 months post-operatively  Identifies appropriate sources of fluids and proteins   States protein recommendations and appropriate sources post-operatively  Identifies the need for appropriate texture modifications, mastication, and bite sizes when consuming solids  Identifies appropriate multivitamin and calcium sources post-operatively  Describes the need for physical activity post-operatively and will follow MD recommendations  States when to call healthcare provider regarding medication questions or post-operative complications  Handouts given during class include:  Phase 3A: Soft, High Protein Diet Handout  Follow-Up Plan: Patient will follow-up at [facility] in 6 weeks for 2 month post-op nutrition visit for diet advancement per MD.

## 2015-10-02 ENCOUNTER — Telehealth: Payer: Self-pay | Admitting: *Deleted

## 2015-10-02 NOTE — Telephone Encounter (Signed)
Unable to reach patient at time of pre-visit call. Left message for patient to return call when available.  

## 2015-10-02 NOTE — Telephone Encounter (Signed)
Patient returning your call.

## 2015-10-05 ENCOUNTER — Ambulatory Visit (INDEPENDENT_AMBULATORY_CARE_PROVIDER_SITE_OTHER): Payer: BC Managed Care – PPO | Admitting: Family Medicine

## 2015-10-05 VITALS — BP 130/80 | HR 83 | Temp 97.7°F | Ht 63.0 in | Wt 227.8 lb

## 2015-10-05 DIAGNOSIS — Z9884 Bariatric surgery status: Secondary | ICD-10-CM | POA: Diagnosis not present

## 2015-10-05 DIAGNOSIS — I10 Essential (primary) hypertension: Secondary | ICD-10-CM | POA: Diagnosis not present

## 2015-10-05 DIAGNOSIS — E669 Obesity, unspecified: Secondary | ICD-10-CM

## 2015-10-05 DIAGNOSIS — E119 Type 2 diabetes mellitus without complications: Secondary | ICD-10-CM | POA: Diagnosis not present

## 2015-10-05 NOTE — Progress Notes (Signed)
[place] [facility] [address], [place], [place] [address] [phone]  Date:  10/05/2015   Name:  Misty Bell   DOB:  September 09, 1955   MRN:  [id number]  PCP:  Lamar Blinks, MD    Chief Complaint: New Patient (Initial Visit)   History of Present Illness:  Misty Bell is a 60 y.o. very pleasant female patient who presents with the following:  Here today as a new patient- her last PCP has passed away.   She has a history of DM, HTN, high cholesterol and obesity Underwent lap sleeve resection about 3 week ago now and is hoping this will help her lose weight and improve her health She is overall feeling better already  She uses norvasc and lisinpril for her HTN, and lasix for swelling She is on crestor for lipids Lab Results  Component Value Date   HGBA1C 7.0* 09/07/2015   She is not on blood glucose medication.  She does have DM, "I just did not want to take another medication"  She is on potassium to use with her lasix for potassium balance   She sees Dr. Redmond Pulling for her bariatric care She also has an OBG who she sees regularly  S/p ablation, does not get menses Quit smoking prior to her surgery  Currently single Wt Readings from Last 3 Encounters:  10/05/15 227 lb 12.8 oz (103.329 kg)  09/29/15 231 lb 8 oz (105.008 kg)  09/16/15 235 lb 9.6 oz (106.867 kg)     Patient Active Problem List   Diagnosis Date Noted  . Hypercholesterolemia 09/14/2015  . Obesity, Class III, BMI 40-49.9 (morbid obesity) ([place]) 09/14/2015  . Hiatal hernia 09/14/2015  . Osteoarthritis of both knees 09/14/2015  . DOE (dyspnea on exertion) 09/02/2015  . Anxiety disorder 04/01/2015  . Back pain 04/01/2015  . Diabetes mellitus type 2 in obese ([place]) 04/01/2015  . Essential hypertension 04/01/2015  . Migraine 04/01/2015  . Spider nevus of skin 10/29/2012    Past Medical History  Diagnosis Date  . Primary osteoarthritis of  both knees   . Hypertension 2010  . Varicose veins   . Migraine   . Anxiety disorder   . Back pain   . Diabetes mellitus type 2 in obese ([place])   . Snoring   . Anxiety     Past Surgical History  Procedure Laterality Date  . Fracture surgery      neck  . Uterine ablation  08/2010  . Dilation and curettage of uterus      years ago-several  . Colonoscopy    . Laparoscopic gastric sleeve resection N/A 09/14/2015    Procedure: LAPAROSCOPIC GASTRIC SLEEVE RESECTION W/UPPER ENDO;  Surgeon: Greer Pickerel, MD;  Location: [facility];  Service: General;  Laterality: N/A;  . Hiatal hernia repair  09/14/2015    Procedure: LAPAROSCOPIC REPAIR OF HIATAL HERNIA;  Surgeon: Greer Pickerel, MD;  Location: [facility];  Service: General;;    Social History  Substance Use Topics  . Smoking status: Former Smoker    Types: Cigarettes    Quit date: 07/27/2015  . Smokeless tobacco: Never Used  . Alcohol Use: Yes    Family History  Problem Relation Age of Onset  . Colon cancer Neg Hx   . Heart disease Mother   . Cervical cancer Mother   . Diabetes Other     [place]  . Heart disease Other     female >53,  [facility]    No Known Allergies  Medication list has been reviewed and updated.  Current Outpatient Prescriptions on File Prior to Visit  Medication Sig Dispense Refill  . amLODipine (NORVASC) 10 MG tablet Take 10 mg by mouth daily.  0  . furosemide (LASIX) 20 MG tablet Take 10 mg by mouth daily.   0  . Potassium Chloride ER 20 MEQ TBCR Take 1 tablet by mouth daily.    . rosuvastatin (CRESTOR) 10 MG tablet Take 1 tablet (10 mg total) by mouth daily. 90 tablet 3  . acetaminophen (TYLENOL) 325 MG tablet Take 650 mg by mouth every 6 (six) hours as needed. Reported on 10/05/2015    . ondansetron (ZOFRAN) 4 MG tablet Take 4 mg by mouth every 8 (eight) hours as needed for nausea or vomiting. Reported on 10/05/2015    . oxycodone (OXY-IR) 5 MG capsule Take 5 mg by mouth every 4 (four) hours as needed. Reported on  10/05/2015    . pantoprazole (PROTONIX) 40 MG tablet Take 1 tablet by mouth daily. Reported on 10/05/2015     No current facility-administered medications on file prior to visit.    Review of Systems:  As per HPI- otherwise negative.   Physical Examination: Filed Vitals:   10/05/15 1403  BP: 130/80  Pulse: 83  Temp: 97.7 F (36.5 C)   Filed Vitals:   10/05/15 1403  Height: _0  (1.6 m)  Weight: 227 lb 12.8 oz (103.329 kg)   Body mass index is 40.36 kg/(m^2). Ideal Body Weight: Weight in (lb) to have BMI = 25: 140.8  GEN: WDWN, NAD, Non-toxic, A & O x 3, looks well, obese HEENT: Atraumatic, Normocephalic. Neck supple. No masses, No LAD. Ears and Nose: No external deformity. CV: RRR, No M/G/R. No JVD. No thrill. No extra heart sounds. PULM: CTA B, no wheezes, crackles, rhonchi. No retractions. No resp. distress. No accessory muscle use. ABD: S, NT, ND, +BS. No rebound. No HSM. EXTR: No c/c/e NEURO Normal gait.  PSYCH: Normally interactive. Conversant. Not depressed or anxious appearing.  Calm demeanor.    Assessment and Plan: Obesity  Status following surgery for weight loss  Controlled type 2 diabetes mellitus without complication, without long-term current use of insulin (HCC)  Essential hypertension  BP is good on lisinopril and norvasc. She has less swelling and wonders about the lasix. Advised that she can try stopping this and see if she needs it for swelling. If not on lasix will not need the potassium She will have a met profile in the next month with her surgeon or I will order for her if not done  For the time being observe her DM as she is actively losing weight Continue crestor for now Plan follow-up in about 3 months   Signed Lamar Blinks, MD

## 2015-10-05 NOTE — Progress Notes (Signed)
Pre visit review using our clinic review tool, if applicable. No additional management support is needed unless otherwise documented below in the visit note. 

## 2015-10-05 NOTE — Patient Instructions (Signed)
It was very nice to see you today congrats on your weight loss surgery- I hope that you have great success with this! If your swelling is not bothering you, you do not have to take the lasix (furosemide). Remember if you do not take the furosemide then you do not need to take the potassium pill either.   If Dr. Redmond Pulling does not draw blood next month please let me know and I will check your electrolytes as a lab visit only.  If he does draw blood please ask him to send me your metabolic profile results Otherwise let's plan to visit in 3 months to check on your progress and blood sugar numbers

## 2015-10-07 ENCOUNTER — Other Ambulatory Visit: Payer: BC Managed Care – PPO

## 2015-11-10 ENCOUNTER — Ambulatory Visit: Payer: BC Managed Care – PPO | Admitting: Dietician

## 2015-11-11 ENCOUNTER — Encounter: Payer: BC Managed Care – PPO | Attending: General Surgery | Admitting: Dietician

## 2015-11-11 ENCOUNTER — Other Ambulatory Visit: Payer: Self-pay | Admitting: Family Medicine

## 2015-11-11 ENCOUNTER — Encounter: Payer: Self-pay | Admitting: Dietician

## 2015-11-11 DIAGNOSIS — Z87891 Personal history of nicotine dependence: Secondary | ICD-10-CM | POA: Insufficient documentation

## 2015-11-11 DIAGNOSIS — Z6841 Body Mass Index (BMI) 40.0 and over, adult: Secondary | ICD-10-CM | POA: Insufficient documentation

## 2015-11-11 DIAGNOSIS — R0683 Snoring: Secondary | ICD-10-CM | POA: Diagnosis not present

## 2015-11-11 DIAGNOSIS — M170 Bilateral primary osteoarthritis of knee: Secondary | ICD-10-CM | POA: Insufficient documentation

## 2015-11-11 NOTE — Progress Notes (Signed)
  Follow-up visit:  8 Weeks Post-Operative Sleeve Gastrectomy Surgery  Medical Nutrition Therapy:  Appt start time: [id number] end time:  [id number].  Primary concerns today: Post-operative Bariatric Surgery Nutrition Management. Returns with a 14 lbs weight loss. Lost 17+ lbs weight loss. Sister passed away recently and it has been hard to stay on track. Has been missing some meals and supplements. Also getting bored with what to eat. Feels like she is not taking in enough fluid.  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 Feels like she needs to clear out her space at home. Looking forward to starting BELT program on 11/20/2015.   Surgery date: 09/14/2015 Surgery type: Sleeve gastrectomy [facility] weight at [facility]: 246 lbs on 05/07/2015 Weight today: 217.5  lbs  Weight change: 14 lbs Total weight loss: 28.5 lbs   TANITA  BODY COMP RESULTS  09/07/15 09/29/15 11/11/15   BMI (kg/m^2) N/A 41.0 38.6   Fat Mass (lbs)  109.5 92.2   Fat Free Mass (lbs)  122.0 125.6   Total Body Water (lbs)  89.5 90.4    Preferred Learning Style:   No preference indicated   Learning Readiness:   Ready  24-hr recall: B (AM): Premier protein shake or Slim Fast low carb with [place] (30 g) Snk (AM): none or scrambled egg (0-6 g) Snk (AM): none or 1/2 oz cheese (0-3 g) L (PM): 1-2 oz tuna/pork/turkey/steak (7-14 g) Snk (PM): 1/2 bag Quest protein chips (10 g) D (PM): egg with a [place] sausage link sometimes with cheese (7-10 g) Snk (PM): none or more chips (0-10 g)  Fluid intake: 48 oz water, 11 oz protein shake 59 oz   Estimated total protein intake: 44-65 g  Medications: see list Supplementation: taking MVI in morning and not always at night, taking 2 calcium pills  Using straws: No Drinking while eating: a little (sips if feels stuck) Hair loss: No Carbonated beverages: No N/V/D/C: nausea one morning when had vitamin without eating, has some constipation  Dumping syndrome: None  Recent physical  activity:  Walks a lot though not planned exercise, uses some exercise equipment, starting the BELT program next week  Progress Towards Goal(s):  In progress.  Handouts given during visit include:  Phase 3B High Protein with non starchy vegetables   Nutritional Diagnosis:  [place]-3.3 Overweight/obesity related to past poor dietary habits and physical inactivity as evidenced by patient w/ recent sleeve gastrectomy surgery following dietary guidelines for continued weight loss.    Intervention:  Nutrition education/diet advancement  Teaching Method Utilized:  Visual Auditory Hands on  Barriers to learning/adherence to lifestyle change: stress  Demonstrated degree of understanding via:  Teach Back   Monitoring/Evaluation:  Dietary intake, exercise, and body weight. Follow up in 1 months for 3 month post-op visit.

## 2015-11-11 NOTE — Patient Instructions (Addendum)
Goals:  Follow Phase 3B: High Protein + Non-Starchy Vegetables  Eat 3-6 small meals/snacks, every 3-5 hrs  Increase lean protein foods to meet 60g goal  Add another protein shake if you don't eat much protein during the day  Increase fluid intake to 64oz +  Avoid drinking 15 minutes before, during and 30 minutes after eating  Aim for >30 min of physical activity daily  Get scale out of your bathroom, weigh once per week  If you can't chew calcium pills, try Citrical Petites (500 mg at a time, 3x day)   Surgery date: 09/14/2015 Surgery type: Sleeve gastrectomy [facility]: 246 lbs on 05/07/2015 Weight today: 217.5  lbs  Weight change: 14 lbs Total weight loss: 28.5 lbs   TANITA  BODY COMP RESULTS  09/07/15 09/29/15 11/11/15   BMI (kg/m^2) N/A 41.0 38.6   Fat Mass (lbs)  109.5 92.2   Fat Free Mass (lbs)  122.0 125.6   Total Body Water (lbs)  89.5 90.4

## 2015-11-12 ENCOUNTER — Other Ambulatory Visit: Payer: Self-pay | Admitting: Emergency Medicine

## 2015-11-12 MED ORDER — POTASSIUM CHLORIDE ER 20 MEQ PO TBCR: 1.0000 | EXTENDED_RELEASE_TABLET | Freq: Every day | ORAL | Status: DC

## 2015-11-27 ENCOUNTER — Telehealth: Payer: Self-pay | Admitting: Family Medicine

## 2015-11-27 MED ORDER — AMLODIPINE BESYLATE 10 MG PO TABS: 10.0000 mg | ORAL_TABLET | Freq: Every day | ORAL | Status: DC

## 2015-11-27 MED ORDER — LISINOPRIL 10 MG PO TABS: 10.0000 mg | ORAL_TABLET | Freq: Every day | ORAL | Status: DC

## 2015-11-27 NOTE — Addendum Note (Signed)
Addended by: Dorrene German on: 11/27/2015 12:13 PM   Modules accepted: Orders

## 2015-11-27 NOTE — Telephone Encounter (Addendum)
[facility] should have called [facility] to have rx transferred to them. Resent rx to [facility] and called [facility] to cancel previous script and was told that it had already been transferred to [facility]. [facility] and they told me that they have indeed received transferred rx and would cancel duplicate rx.

## 2015-11-27 NOTE — Telephone Encounter (Signed)
Pt called [facility] and they state they do not have faxed RX or called in prescription. Please resend at pt request.

## 2015-11-27 NOTE — Telephone Encounter (Signed)
Relation to [id number] Call back number:[phone] Pharmacy: [facility], [place] [phone] (Phone) [phone] (Fax)         Reason for call:  Patient requesting a refill amLODipine (NORVASC) 10 MG tablet and lisinopril (PRINIVIL,ZESTRIL) 10 MG tablet. Patient states she is completely out and would like medication refill today. Informed patient PCP is out of the office and in the future please call in advance patient voice understanding and would like medication refilled today. Please advise

## 2015-11-27 NOTE — Telephone Encounter (Signed)
Medications filled. Acc[facility], called [facility] and they will have rxs transferred to them.

## 2015-12-07 ENCOUNTER — Encounter: Payer: Self-pay | Admitting: Dietician

## 2015-12-07 ENCOUNTER — Encounter: Payer: BC Managed Care – PPO | Attending: General Surgery | Admitting: Dietician

## 2015-12-07 DIAGNOSIS — Z87891 Personal history of nicotine dependence: Secondary | ICD-10-CM | POA: Diagnosis not present

## 2015-12-07 DIAGNOSIS — Z6841 Body Mass Index (BMI) 40.0 and over, adult: Secondary | ICD-10-CM | POA: Insufficient documentation

## 2015-12-07 DIAGNOSIS — R0683 Snoring: Secondary | ICD-10-CM | POA: Insufficient documentation

## 2015-12-07 DIAGNOSIS — M170 Bilateral primary osteoarthritis of knee: Secondary | ICD-10-CM | POA: Diagnosis present

## 2015-12-07 NOTE — Progress Notes (Signed)
  Follow-up visit:  12 Weeks Post-Operative Sleeve Gastrectomy Surgery  Medical Nutrition Therapy:  Appt start time: 1400 end time:  1450  Primary concerns today: Post-operative Bariatric Surgery Nutrition Management. Returns with a 5.3 lbs weight loss. Had a lot of stress in the past month. Aunt just passed away. Has not been weighing herself regularly. Has been doing a lot of driving back and forth visiting family. Doing better with getting meals in.   Feels like she is not taking in enough fluid.  Started the BELT program on 11/20/2015.   Surgery date: 09/14/2015 Surgery type: Sleeve gastrectomy Start weight at [facility]: 246 lbs on 05/07/2015 Weight today: 212.2  Lbs  Weight change: 5.3 lbs Total weight loss: 33.8 lbs   TANITA  BODY COMP RESULTS  09/07/15 09/29/15 11/11/15 12/07/15   BMI (kg/m^2) N/A 41.0 38.6 37.6   Fat Mass (lbs)  109.5 92.2 92.4   Fat Free Mass (lbs)  122.0 125.6 119.8   Total Body Water (lbs)  89.5 90.4 86.2    Preferred Learning Style:   No preference indicated   Learning Readiness:   Ready  24-hr recall: B (AM): 1/2-1 Premier protein shake (15-30 g) Snk (AM): cheese or scrambled egg with cheese (6-10 g) L (PM): 2-3 oz tuna/chicken with cucumber and tomato sometimes with Quest chips (14-21 g) Snk (PM): none or sugar free popsicle or 1/2 protein shake D (PM): 2-3 oz chicken or salmon/tilapia with vegetables (21-30 g) Snk (PM): none or sugar free popsicle or more chips (0-10 g)  Fluid intake: 34-51 oz water, 11-22 oz protein shake 45-70 oz   Estimated total protein intake: 60-80 g  Medications: see list Supplementation: taking MVI in morning and not always at night, taking 2 calcium pills  Using straws: No Drinking while eating: No Hair loss: No Carbonated beverages: No N/V/D/C: nausea when doesn't take her time eating, has some constipation but not taking anything Dumping syndrome: None  Recent physical activity:  BELT program 3 x week, some  walking while at work   Progress Towards Goal(s):  In progress.  Handouts given during visit include:  High protein snacks   Nutritional Diagnosis:  Misty Bell-3.3 Overweight/obesity related to past poor dietary habits and physical inactivity as evidenced by patient w/ recent sleeve gastrectomy surgery following dietary guidelines for continued weight loss.    Intervention:  Nutrition education/diet advancement Goals:  Follow Phase 3B: High Protein + Non-Starchy Vegetables  Eat 3-6 small meals/snacks, every 3-5 hrs  Increase lean protein foods to meet 60g goal  Add another protein shake if you don't eat much protein during the day  Increase fluid intake to 64oz +  Avoid drinking 15 minutes before, during and 30 minutes after eating  Aim for >30 min of physical activity daily  Take your time when you eat, only have snacks if you are hungry  Try jerky or roasted chick peas for snack  If you can't chew calcium pills, try Citrical Petites (400-500 mg at a time, 3x day)   If you can get in 3 oz of protein 3 x per day, you don't need to do shakes  Teaching Method Utilized:  Visual Auditory Hands on  Barriers to learning/adherence to lifestyle change: stress  Demonstrated degree of understanding via:  Teach Back   Monitoring/Evaluation:  Dietary intake, exercise, and body weight. Follow up in 6 weeks for 4.5 month post-op visit.

## 2015-12-07 NOTE — Patient Instructions (Addendum)
Goals:  Follow Phase 3B: High Protein + Non-Starchy Vegetables  Eat 3-6 small meals/snacks, every 3-5 hrs  Increase lean protein foods to meet 60g goal  Add another protein shake if you don't eat much protein during the day  Increase fluid intake to 64oz +  Avoid drinking 15 minutes before, during and 30 minutes after eating  Aim for >30 min of physical activity daily  Take your time when you eat, only have snacks if you are hungry  Try jerky or roasted chick peas for snack  If you can't chew calcium pills, try Citrical Petites (400-500 mg at a time, 3x day)   If you can get in 3 oz of protein 3 x per day, you don't need to do shakes  Surgery date: 09/14/2015 Surgery type: Sleeve gastrectomy Start weight at [facility]: 246 lbs on 05/07/2015 Weight today: 212.2  Lbs  Weight change: 5.3 lbs Total weight loss: 33.8 lbs   TANITA  BODY COMP RESULTS  09/07/15 09/29/15 11/11/15 12/07/15   BMI (kg/m^2) N/A 41.0 38.6 37.6   Fat Mass (lbs)  109.5 92.2 92.4   Fat Free Mass (lbs)  122.0 125.6 119.8   Total Body Water (lbs)  89.5 90.4 86.2

## 2015-12-24 ENCOUNTER — Ambulatory Visit: Payer: BC Managed Care – PPO | Admitting: Cardiology

## 2015-12-28 ENCOUNTER — Encounter: Payer: Self-pay | Admitting: Cardiology

## 2016-01-06 ENCOUNTER — Ambulatory Visit: Payer: BC Managed Care – PPO | Admitting: Family Medicine

## 2016-01-18 ENCOUNTER — Ambulatory Visit: Payer: BC Managed Care – PPO | Admitting: Dietician

## 2016-02-20 ENCOUNTER — Encounter: Payer: Self-pay | Admitting: *Deleted

## 2016-03-14 ENCOUNTER — Other Ambulatory Visit: Payer: Self-pay | Admitting: Family Medicine

## 2016-03-14 NOTE — Telephone Encounter (Signed)
°  Relationship to patient: Self  Can be reached: [phone]  [id number] [address], [place] [phone] (Phone) [phone] (Fax)    Reason for call: Patient states that she needs a refill on Lasix because it was called in wrong. States that the last dr that called it in did not call in her normal dose so she ran out too soon. Request call back so that she can explain why it was called in wrong if there is a problem.

## 2016-06-22 ENCOUNTER — Other Ambulatory Visit: Payer: Self-pay | Admitting: Family Medicine

## 2016-06-22 DIAGNOSIS — Z1231 Encounter for screening mammogram for malignant neoplasm of breast: Secondary | ICD-10-CM

## 2016-06-23 ENCOUNTER — Ambulatory Visit
Admission: RE | Admit: 2016-06-23 | Discharge: 2016-06-23 | Disposition: A | Discharge status: Home / Self Care | Payer: BC Managed Care – PPO | Source: Ambulatory Visit | Attending: Family Medicine | Admitting: Family Medicine

## 2016-06-23 DIAGNOSIS — Z1231 Encounter for screening mammogram for malignant neoplasm of breast: Secondary | ICD-10-CM

## 2016-07-08 ENCOUNTER — Encounter: Payer: Self-pay | Admitting: Gastroenterology

## 2016-07-20 ENCOUNTER — Other Ambulatory Visit: Payer: Self-pay | Admitting: Emergency Medicine

## 2016-07-20 ENCOUNTER — Telehealth: Payer: Self-pay | Admitting: Family Medicine

## 2016-07-20 MED ORDER — FUROSEMIDE 20 MG PO TABS: 10.0000 mg | ORAL_TABLET | Freq: Every day | ORAL | 0 refills | Status: DC

## 2016-07-20 MED ORDER — AMLODIPINE BESYLATE 10 MG PO TABS: 10.0000 mg | ORAL_TABLET | Freq: Every day | ORAL | 2 refills | Status: DC

## 2016-07-20 NOTE — Telephone Encounter (Signed)
Relation to [id number] Call back number:[phone] Pharmacy: [facility], [place] [phone] (Phone) [phone] (Fax)     Reason for call:  Patient requesting a refill amLODipine (NORVASC) 10 MG tablet and furosemide (LASIX) 20 MG tablet

## 2016-07-20 NOTE — Telephone Encounter (Signed)
Refills sent to pharmacy. 

## 2016-07-27 ENCOUNTER — Ambulatory Visit: Payer: BC Managed Care – PPO | Admitting: Family Medicine

## 2016-07-28 ENCOUNTER — Ambulatory Visit (INDEPENDENT_AMBULATORY_CARE_PROVIDER_SITE_OTHER): Payer: BC Managed Care – PPO | Admitting: Family Medicine

## 2016-07-28 VITALS — BP 120/75 | HR 95 | Temp 97.7°F | Ht 62.0 in | Wt 192.8 lb

## 2016-07-28 DIAGNOSIS — G4700 Insomnia, unspecified: Secondary | ICD-10-CM

## 2016-07-28 DIAGNOSIS — E119 Type 2 diabetes mellitus without complications: Secondary | ICD-10-CM

## 2016-07-28 DIAGNOSIS — E1169 Type 2 diabetes mellitus with other specified complication: Secondary | ICD-10-CM | POA: Diagnosis not present

## 2016-07-28 DIAGNOSIS — Z23 Encounter for immunization: Secondary | ICD-10-CM

## 2016-07-28 DIAGNOSIS — F439 Reaction to severe stress, unspecified: Secondary | ICD-10-CM

## 2016-07-28 DIAGNOSIS — I10 Essential (primary) hypertension: Secondary | ICD-10-CM

## 2016-07-28 DIAGNOSIS — E785 Hyperlipidemia, unspecified: Secondary | ICD-10-CM

## 2016-07-28 NOTE — Patient Instructions (Addendum)
Good to see you today. We will check your labs today.  Will let you know the results and if we need to restart any medications. Keep up the good work with your weight loss.  Continue to work on [employer] and exercise. You got your flu shot and pneumonia vaccine today Unless your labs suggest otherwise let's plan to recheck in 4 months   JC

## 2016-07-28 NOTE — Progress Notes (Signed)
[place] at [facility] [address] [place], [place] [address] [phone]  Date:  07/28/2016   Name:  Misty Bell   DOB:  1956-02-22   MRN:  [id number]  PCP:  Misty Blinks, MD    Chief Complaint: Follow-up (Pt here for follow up visit. Pt has stopped several meds since last visit. Pt has lost 34lbs since last visit.  )   History of Present Illness:  Misty Bell is a 61 y.o. very pleasant female patient who presents with the following:  HPI from last visit on 10-05-2015:  Here today as a new patient- her last PCP has passed away.   She has a history of DM, HTN, high cholesterol and obesity Underwent lap sleeve resection about 3 week ago now and is hoping this will help her lose weight and improve her health She is overall feeling better already  She uses norvasc and lisinpril for her HTN, and lasix for swelling She is on crestor for lipids RecentLabs       Lab Results  Component Value Date   HGBA1C 7.0* 09/07/2015     She is not on blood glucose medication.  She does have DM, "I just did not want to take another medication"  She is on potassium to use with her lasix for potassium balance   She sees Dr. Redmond Pulling for her bariatric care She also has an OBG who she sees regularly  S/p ablation, does not get menses Quit smoking prior to her surgery  Currently single   Plan from visit on 10-05-2015:  Status following surgery for weight loss  Controlled type 2 diabetes mellitus without complication, without long-term current use of insulin ([place])  Essential hypertension  BP is good on lisinopril and norvasc. She has less swelling and wonders about the lasix. Advised that she can try stopping this and see if she needs it for swelling. If not on lasix will not need the potassium She will have a met profile in the next month with her surgeon or I will order for her if not done  For the time being  observe her DM as she is actively losing weight Continue crestor for now Plan follow-up in about 3 months    HPI for today's visit:  Coming in for a follow-up visit, to discuss medications.  Weight on 10-05-2015:   227 lb 12.8 oz (103.3 kg Filed Weights   07/28/16 0931  Weight: 192 lb 12.8 oz (87.5 kg)  Current goal weight of 175.  Has not been working out.  Is planning on walking again.  Is taking Lasix everyday and it is managing her swelling.  When exercising regularly, she doesn't really need the Lasix as her swelling is better on exercise days   She has diet controlled DM  Graduated with her Bachelor's degree.  Is under stress here lately.  She has had deaths in her family and mother has been in the ICU for last 3 weeks.  Her younger sister died of an MI a few weeks ago. She is now the default matriarch of her family.  Has been on Xanax before but does not request to go back on this medication at this time.  There is always something back there in the back of her mind.  Her sleep cycle is off.  She doesn't sleep well.  Getting 4 hours of sleep, total time.  Has trouble falling asleep.  Exhausted during the  day, with the level of sleep.  Has seen dermatology in 2016.  Derm "cut off rash" on top of feet.  "Rash on legs, feet, hands."    Patient has stopped taking Crestor, about 3 months ago, when the prescription ran out.  However with her weight loss we hope that her cholesterol will still look ok  She mentions a strong history of CAD in her family- fortunately she did have a cardiac eval prior to her gastric sleeve that was reassuring  She had a myoview in 10/16:   Nuclear stress EF: 73%.  Blood pressure demonstrated a hypertensive response to exercise.  There was no ST segment deviation noted during stress.  The study is normal.  This is a low risk study.  The left ventricular ejection fraction is normal (55-65%).  She is due for a flu shot and pneumonia shot today- she  is willing to have both of these injections today   Patient Active Problem List   Diagnosis Date Noted  . Hypercholesterolemia 09/14/2015  . Obesity, Class III, BMI 40-49.9 (morbid obesity) ([place]) 09/14/2015  . Hiatal hernia 09/14/2015  . Osteoarthritis of both knees 09/14/2015  . DOE (dyspnea on exertion) 09/02/2015  . Anxiety disorder 04/01/2015  . Back pain 04/01/2015  . Diabetes mellitus type 2 in obese ([place]) 04/01/2015  . Essential hypertension 04/01/2015  . Migraine 04/01/2015  . Spider nevus of skin 10/29/2012    Past Medical History:  Diagnosis Date  . Anxiety   . Anxiety disorder   . Back pain   . Diabetes mellitus type 2 in obese ([place])   . Hypertension 2010  . Migraine   . Primary osteoarthritis of both knees   . Snoring   . Varicose veins     Past Surgical History:  Procedure Laterality Date  . COLONOSCOPY    . DILATION AND CURETTAGE OF UTERUS     years ago-several  . FRACTURE SURGERY     neck  . HIATAL HERNIA REPAIR  09/14/2015   Procedure: LAPAROSCOPIC REPAIR OF HIATAL HERNIA;  Surgeon: Greer Pickerel, MD;  Location: [facility];  Service: General;;  . LAPAROSCOPIC GASTRIC SLEEVE RESECTION N/A 09/14/2015   Procedure: LAPAROSCOPIC GASTRIC SLEEVE RESECTION W/UPPER ENDO;  Surgeon: Greer Pickerel, MD;  Location: [facility];  Service: General;  Laterality: N/A;  . uterine ablation  08/2010    Social History  Substance Use Topics  . Smoking status: Former Smoker    Types: Cigarettes    Quit date: 07/27/2015  . Smokeless tobacco: Never Used  . Alcohol use Yes    Family History  Problem Relation Age of Onset  . Colon cancer Neg Hx   . Heart disease Mother   . Cervical cancer Mother   . Diabetes Other     [place]  . Heart disease Other     female >87, [facility]    No Known Allergies  Medication list has been reviewed and updated.  Current Outpatient Prescriptions on File Prior to Visit  Medication Sig Dispense Refill  . amLODipine (NORVASC) 10 MG tablet Take 1 tablet (10  mg total) by mouth daily. 30 tablet 2  . furosemide (LASIX) 20 MG tablet Take 0.5 tablets (10 mg total) by mouth daily. 30 tablet 0   No current facility-administered medications on file prior to visit.     Review of Systems:  Review of Systems  Constitutional: Positive for malaise/fatigue. Negative for chills, diaphoresis and fever.       Positive for intentional weight  loss.  HENT: Negative for congestion, ear discharge, ear pain, hearing loss, nosebleeds, sinus pain, sore throat and tinnitus.   Eyes: Negative for blurred vision, double vision, photophobia, pain, discharge and redness.  Respiratory: Negative for cough, hemoptysis, sputum production, shortness of breath, wheezing and stridor.   Cardiovascular: Negative for chest pain, palpitations, orthopnea, claudication, leg swelling and PND.  Gastrointestinal: Negative for abdominal pain, blood in stool, constipation, diarrhea, heartburn, melena, nausea and vomiting.  Genitourinary: Negative for dysuria, flank pain, frequency, hematuria and urgency.  Musculoskeletal: Negative for back pain, joint pain, myalgias and neck pain.  Skin: Positive for rash. Negative for itching.  Neurological: Negative for dizziness, tingling, tremors, sensory change, speech change, focal weakness, seizures, weakness and headaches.  Endo/Heme/Allergies: Negative for environmental allergies and polydipsia. Does not bruise/bleed easily.  Psychiatric/Behavioral: Negative for depression and memory loss. The patient is nervous/anxious and has insomnia.      Physical Examination: Vitals:   07/28/16 0931  BP: 120/75  Pulse: 95  Temp: 97.7 F (36.5 C)   Vitals:   07/28/16 0931  Weight: 192 lb 12.8 oz (87.5 kg)  Height: _0  (1.575 m)   Body mass index is 35.26 kg/m. Ideal Body Weight: Weight in (lb) to have BMI = 25: 136.4  GEN: WDWN, NAD, Non-toxic, A & O x 3, obese but has lost.  Looks very well today HEENT: Atraumatic, Normocephalic. Neck supple.  No masses, No LAD.  Bilateral TM wnl, oropharynx normal.  PEERL,EOMI.   Ears and Nose: No external deformity. CV: RRR, No M/G/R. No JVD. No thrill. No extra heart sounds. PULM: CTA B, no wheezes, crackles, rhonchi. No retractions. No resp. distress. No accessory muscle use. EXTR: No c/c/e NEURO Normal gait.  PSYCH: Normally interactive. Conversant. Not depressed or anxious appearing.  Calm demeanor.    Assessment and Plan: Essential hypertension - Plan: Comp Met (CMET)  Controlled type 2 diabetes mellitus without complication, without long-term current use of insulin ([facility]) - Plan: Comp Met (CMET), Hemoglobin A1c  Hyperlipidemia associated with type 2 diabetes mellitus ([place]) - Plan: Lipid panel  Stress  Insomnia, unspecified type  Immunization due - Plan: Pneumococcal polysaccharide vaccine 23-valent greater than or equal to 2yo subcutaneous/IM, Flu Vaccine QUAD 36+ mos IM (Fluarix & Fluzone Quad PF  Here today for a recheck and to discuss a few concerns Her BP is under good control- her only current meds are lasix prn and her amlodipine.  She is planning to continue her weight loss efforts She stopped her crestor- will check her CHL today She is under some stress due to her family situation, but feels that she is handling this ok and does not necessarily want to start on medication for this. She will let us know if not doing ok Pneumovax 23 and flu shot today   Signed Misty Blinks, MD

## 2016-08-05 ENCOUNTER — Encounter: Payer: Self-pay | Admitting: Family Medicine

## 2016-08-23 ENCOUNTER — Other Ambulatory Visit: Payer: BC Managed Care – PPO

## 2016-08-23 LAB — LIPID PANEL
CHOL/HDL RATIO: 3.7 ratio (ref <5.0)
CHOLESTEROL: 237 mg/dL — AB (ref <200)
HDL: 64 mg/dL (ref >50)
LDL Cholesterol: 138 mg/dL — ABNORMAL HIGH (ref <100)
Triglycerides: 174 mg/dL — ABNORMAL HIGH (ref <150)
VLDL: 35 mg/dL — AB (ref <30)

## 2016-08-23 LAB — COMPREHENSIVE METABOLIC PANEL
ALK PHOS: 73 U/L (ref 39–117)
ALT: 12 U/L (ref 0–35)
AST: 13 U/L (ref 0–37)
Albumin: 3.8 g/dL (ref 3.5–5.2)
BILIRUBIN TOTAL: 0.3 mg/dL (ref 0.2–1.2)
BUN: 14 mg/dL (ref 6–23)
CALCIUM: 9.2 mg/dL (ref 8.4–10.5)
CO2: 32 mEq/L (ref 19–32)
Chloride: 104 mEq/L (ref 96–112)
Creatinine, Ser: 0.63 mg/dL (ref 0.40–1.20)
GFR: 124.54 mL/min (ref >60.00)
GLUCOSE: 107 mg/dL — AB (ref 70–99)
Potassium: 3.4 mEq/L — ABNORMAL LOW (ref 3.5–5.1)
Sodium: 140 mEq/L (ref 135–145)
TOTAL PROTEIN: 6.9 g/dL (ref 6.0–8.3)

## 2016-08-24 LAB — HEMOGLOBIN A1C
Hgb A1c MFr Bld: 6 % — ABNORMAL HIGH (ref <5.7)
MEAN PLASMA GLUCOSE: 126 mg/dL

## 2016-09-19 ENCOUNTER — Encounter: Payer: Self-pay | Admitting: Obstetrics & Gynecology

## 2016-09-19 ENCOUNTER — Telehealth: Payer: Self-pay | Admitting: Family Medicine

## 2016-09-19 ENCOUNTER — Ambulatory Visit (INDEPENDENT_AMBULATORY_CARE_PROVIDER_SITE_OTHER): Payer: BC Managed Care – PPO | Admitting: Obstetrics & Gynecology

## 2016-09-19 VITALS — BP 137/79 | HR 97 | Ht 63.0 in | Wt 192.0 lb

## 2016-09-19 DIAGNOSIS — Z0000 Encounter for general adult medical examination without abnormal findings: Secondary | ICD-10-CM | POA: Diagnosis not present

## 2016-09-19 DIAGNOSIS — Z01419 Encounter for gynecological examination (general) (routine) without abnormal findings: Secondary | ICD-10-CM | POA: Diagnosis not present

## 2016-09-19 DIAGNOSIS — Z780 Asymptomatic menopausal state: Secondary | ICD-10-CM

## 2016-09-19 DIAGNOSIS — Z716 Tobacco abuse counseling: Secondary | ICD-10-CM

## 2016-09-19 NOTE — Telephone Encounter (Signed)
Tried to return pt's call to discuss most recent lab results. No answer, unable to leave message due to voicemail box being full

## 2016-09-19 NOTE — Patient Instructions (Addendum)
Health Risks of Smoking Smoking cigarettes is very bad for your health. Tobacco smoke has over 200 known poisons in it. It contains the poisonous gases nitrogen oxide and carbon monoxide. There are over 60 chemicals in tobacco smoke that cause cancer. Smoking is difficult to quit because a chemical in tobacco, called nicotine, causes addiction or dependence. When you smoke and inhale, nicotine is absorbed rapidly into the bloodstream through your lungs. Both inhaled and non-inhaled nicotine may be addictive. What are the risks of cigarette smoke? Cigarette smokers have an increased risk of many serious medical problems, including:  Lung cancer.  Lung disease, such as pneumonia, bronchitis, and emphysema.  Chest pain (angina) and heart attack because the heart is not getting enough oxygen.  Heart disease and peripheral blood vessel disease.  High blood pressure (hypertension).  Stroke.  Oral cancer, including cancer of the lip, mouth, or voice box.  Bladder cancer.  Pancreatic cancer.  Cervical cancer.  Pregnancy complications, including premature birth.  Stillbirths and smaller newborn babies, birth defects, and genetic damage to sperm.  Early menopause.  Lower estrogen level for women.  Infertility.  Facial wrinkles.  Blindness.  Increased risk of broken bones (fractures).  Senile dementia.  Stomach ulcers and internal bleeding.  Delayed wound healing and increased risk of complications during surgery.  Even smoking lightly shortens your life expectancy by several years. Because of secondhand smoke exposure, children of smokers have an increased risk of the following:  Sudden infant death syndrome (SIDS).  Respiratory infections.  Lung cancer.  Heart disease.  Ear infections. What are the benefits of quitting? There are many health benefits of quitting smoking. Here are some of them:  Within days of quitting smoking, your risk of having a heart attack  decreases, your blood flow improves, and your lung capacity improves. Blood pressure, pulse rate, and breathing patterns start returning to normal soon after quitting.  Within months, your lungs may clear up completely.  Quitting for 10 years reduces your risk of developing lung cancer and heart disease to almost that of a nonsmoker.  People who quit may see an improvement in their overall quality of life. How do I quit smoking? Smoking is an addiction with both physical and psychological effects, and longtime habits can be hard to change. Your health care provider can recommend:  Programs and community resources, which may include group support, education, or talk therapy.  Prescription medicines to help reduce cravings.  Nicotine replacement products, such as patches, gum, and nasal sprays. Use these products only as directed. Do not replace cigarette smoking with electronic cigarettes, which are commonly called e-cigarettes. The safety of e-cigarettes is not known, and some may contain harmful chemicals.  A combination of two or more of these methods. Where to find more information:  American Lung Association: www.lung.org  American Cancer Society: www.cancer.org Summary  Smoking cigarettes is very bad for your health. Cigarette smokers have an increased risk of many serious medical problems, including several cancers, heart disease, and stroke.  Smoking is an addiction with both physical and psychological effects, and longtime habits can be hard to change.  By stopping right away, you can greatly reduce the risk of medical problems for you and your family.  To help you quit smoking, your health care provider can recommend programs, community resources, prescription medicines, and nicotine replacement products such as patches, gum, and nasal sprays. This information is not intended to replace advice given to you by your health care provider. Make sure  you discuss any questions you  have with your health care provider. Document Released: 07/21/2004 Document Revised: 06/17/2016 Document Reviewed: 06/17/2016 Elsevier Interactive Patient Education  2017 [place] with Quitting Smoking Quitting smoking is a physical and mental challenge. You will face cravings, withdrawal symptoms, and temptation. Before quitting, work with your health care provider to make a plan that can help you cope. Preparation can help you quit and keep you from giving in. How can I cope with cravings? Cravings usually last for 5-10 minutes. If you get through it, the craving will pass. Consider taking the following actions to help you cope with cravings:  Keep your mouth busy:  Chew sugar-free gum.  Suck on hard candies or a straw.  Brush your teeth.  Keep your hands and body busy:  Immediately change to a different activity when you feel a craving.  Squeeze or play with a ball.  Do an activity or a hobby, like making bead jewelry, practicing needlepoint, or working with wood.  Mix up your normal routine.  Take a short exercise break. Go for a quick walk or run up and down stairs.  Spend time in public places where smoking is not allowed.  Focus on doing something kind or helpful for someone else.  Call a friend or family member to talk during a craving.  Join a support group.  Call a quit line, such as 1-800-QUIT-NOW.  Talk with your health care provider about medicines that might help you cope with cravings and make quitting easier for you. How can I deal with withdrawal symptoms? Your body may experience negative effects as it tries to get used to not having nicotine in the system. These effects are called withdrawal symptoms. They may include:  Feeling hungrier than normal.  Trouble concentrating.  Irritability.  Trouble sleeping.  Feeling depressed.  Restlessness and agitation.  Craving a cigarette. 1.  To manage withdrawal symptoms:  Avoid places,  people, and activities that trigger your cravings.  Remember why you want to quit.  Get plenty of sleep.  Avoid coffee and other caffeinated drinks. These may worsen some of your symptoms. How can I handle social situations? Social situations can be difficult when you are quitting smoking, especially in the first few weeks. To manage this, you can:  Avoid parties, bars, and other social situations where people might be smoking.  Avoid alcohol.  Leave right away if you have the urge to smoke.  Explain to your family and friends that you are quitting smoking. Ask for understanding and support.  Plan activities with friends or family where smoking is not an option. What are some ways I can cope with stress? Wanting to smoke may cause stress, and stress can make you want to smoke. Find ways to manage your stress. Relaxation techniques can help. For example:  Breathe slowly and deeply, in through your nose and out through your mouth.  Listen to soothing, relaxing music.  Talk with a family member or friend about your stress.  Light a candle.  Soak in a bath or take a shower.  Think about a peaceful place. What are some ways I can prevent weight gain? Be aware that many people gain weight after they quit smoking. However, not everyone does. To keep from gaining weight, have a plan in place before you quit and stick to the plan after you quit. Your plan should include:  Having healthy snacks. When you have a craving, it may help to:  Eat plain popcorn, crunchy carrots, celery, or other cut vegetables.  Chew sugar-free gum.  Changing how you eat:  Eat small portion sizes at meals.  Eat 4-6 small meals throughout the day instead of 1-2 large meals a day.  Be mindful when you eat. Do not watch television or do other things that might distract you as you eat.  Exercising regularly:  Make time to exercise each day. If you do not have time for a long workout, do short bouts of  exercise for 5-10 minutes several times a day.  Do some form of strengthening exercise, like weight lifting, and some form of aerobic exercise, like running or swimming.  Drinking plenty of water or other low-calorie or no-calorie drinks. Drink 6-8 glasses of water daily, or as much as instructed by your health care provider. Summary  Quitting smoking is a physical and mental challenge. You will face cravings, withdrawal symptoms, and temptation to smoke again. Preparation can help you as you go through these challenges.  You can cope with cravings by keeping your mouth busy (such as by chewing gum), keeping your body and hands busy, and making calls to family, friends, or a helpline for people who want to quit smoking.  You can cope with withdrawal symptoms by avoiding places where people smoke, avoiding drinks with caffeine, and getting plenty of rest.  Ask your health care provider about the different ways to prevent weight gain, avoid stress, and handle social situations. This information is not intended to replace advice given to you by your health care provider. Make sure you discuss any questions you have with your health care provider. Document Released: 06/10/2016 Document Revised: 06/10/2016 Document Reviewed: 06/10/2016 Elsevier Interactive Patient Education  2017 [place]. Menopause Menopause is the normal time of life when menstrual periods stop completely. Menopause is complete when you have missed 12 consecutive menstrual periods. It usually occurs between the ages of 30 years and 16 years. Very rarely does a woman develop menopause before the age of 40 years. At menopause, your ovaries stop producing the female hormones estrogen and progesterone. This can cause undesirable symptoms and also affect your health. Sometimes the symptoms may occur 4-5 years before the menopause begins. There is no relationship between menopause and:  Oral contraceptives.  Number of children you  had.  Race.  The age your menstrual periods started (menarche). Heavy smokers and very thin women may develop menopause earlier in life. What are the causes?  The ovaries stop producing the female hormones estrogen and progesterone. Other causes include:  Surgery to remove both ovaries.  The ovaries stop functioning for no known reason.  Tumors of the pituitary gland in the brain.  Medical disease that affects the ovaries and hormone production.  Radiation treatment to the abdomen or pelvis.  Chemotherapy that affects the ovaries. What are the signs or symptoms?  Hot flashes.  Night sweats.  Decrease in sex drive.  Vaginal dryness and thinning of the vagina causing painful intercourse.  Dryness of the skin and developing wrinkles.  Headaches.  Tiredness.  Irritability.  Memory problems.  Weight gain.  Bladder infections.  Hair growth of the face and chest.  Infertility. More serious symptoms include:  Loss of bone (osteoporosis) causing breaks (fractures).  Depression.  Hardening and narrowing of the arteries (atherosclerosis) causing heart attacks and strokes. How is this diagnosed?  When the menstrual periods have stopped for 12 straight months.  Physical exam.  Hormone studies of the blood. How is  this treated? There are many treatment choices and nearly as many questions about them. The decisions to treat or not to treat menopausal changes is an individual choice made with your health care provider. Your health care provider can discuss the treatments with you. Together, you can decide which treatment will work best for you. Your treatment choices may include:  Hormone therapy (estrogen and progesterone).  Non-hormonal medicines.  Treating the individual symptoms with medicine (for example antidepressants for depression).  Herbal medicines that may help specific symptoms.  Counseling by a psychiatrist or psychologist.  Group  therapy.  Lifestyle changes including:  Eating healthy.  Regular exercise.  Limiting caffeine and alcohol.  Stress management and meditation.  No treatment. Follow these instructions at home:  Take the medicine your health care provider gives you as directed.  Get plenty of sleep and rest.  Exercise regularly.  Eat a diet that contains calcium (good for the bones) and soy products (acts like estrogen hormone).  Avoid alcoholic beverages.  Do not smoke.  If you have hot flashes, dress in layers.  Take supplements, calcium, and vitamin D to strengthen bones.  You can use over-the-counter lubricants or moisturizers for vaginal dryness.  Group therapy is sometimes very helpful.  Acupuncture may be helpful in some cases. Contact a health care provider if:  You are not sure you are in menopause.  You are having menopausal symptoms and need advice and treatment.  You are still having menstrual periods after age 85 years.  You have pain with intercourse.  Menopause is complete (no menstrual period for 12 months) and you develop vaginal bleeding.  You need a referral to a specialist (gynecologist, psychiatrist, or psychologist) for treatment. Get help right away if:  You have severe depression.  You have excessive vaginal bleeding.  You fell and think you have a broken bone.  You have pain when you urinate.  You develop leg or chest pain.  You have a fast pounding heart beat (palpitations).  You have severe headaches.  You develop vision problems.  You feel a lump in your breast.  You have abdominal pain or severe indigestion. This information is not intended to replace advice given to you by your health care provider. Make sure you discuss any questions you have with your health care provider. Document Released: 09/03/2003 Document Revised: 11/19/2015 Document Reviewed: 01/10/2013 Elsevier Interactive Patient Education  2017 [employer].

## 2016-09-19 NOTE — Telephone Encounter (Signed)
°  Relation to GB:KORJ Call back number: [phone] Pharmacy:  Reason for call: pt came in the office wanted the status of her labs that were done in feb, pt states she did not see the mychart message that Dr. Lorelei Pont sent her on 08/05/16 wanting to know if she had the labs drawn, pt did have the labs drawn and would like to know the results.

## 2016-09-19 NOTE — Progress Notes (Signed)
Subjective:     Misty Bell is a 61 y.o. female here for a routine exam.  Current complaints: no problems Pts mother had a hyst for an unknown 'type of female cancer.'    Pts mother died in Oct 20, 2008 of a massive heart attack. Her sister dies in 12-21-22 and her brother 2 weeks prev.    Tob use: sometimes.  Has cut down drastically. Wants to quit  Just graduated from college with a BS in Women's studies.  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 She has lost 65#.    [name]    Gynecologic History No LMP recorded. Patient has had an ablation. Contraception: post menopausal status; prev BTL Last Pap: Dec 2016. Results were: normal Last mammogram: 05/2016. Results were: normal  Obstetric History OB History  Gravida Para Term Preterm AB Living  4 3          SAB TAB Ectopic Multiple Live Births          3    # Outcome Date GA Lbr Len/2nd Weight Sex Delivery Anes PTL Lv  4 Gravida           3 Para      Vag-Spont     2 Para      Vag-Spont     1 Para      Vag-Spont         The following portions of the patient's history were reviewed and updated as appropriate: allergies, current medications, past family history, past medical history, past social history, past surgical history and problem list.  Review of Systems Pertinent items are noted in HPI.    Objective:   BP 137/79 (BP Location: Left Arm)   Pulse 97   Ht 5\' 3"  (1.6 m)   Wt 192 lb (87.1 kg)   BMI 34.01 kg/m  General Appearance:    Alert, cooperative, no distress, appears stated age  Head:    Normocephalic, without obvious abnormality, atraumatic  Eyes:    conjunctiva/corneas clear, EOM's intact, both eyes  Ears:    Normal external ear canals, both ears  Nose:   Nares normal, septum midline, mucosa normal, no drainage    or sinus tenderness  Throat:   Lips, mucosa, and tongue normal; teeth and gums normal  Neck:   Supple, symmetrical, trachea midline, no adenopathy;    thyroid:  no enlargement/tenderness/nodules  Back:      Symmetric, no curvature, ROM normal, no CVA tenderness  Lungs:     Clear to auscultation bilaterally, respirations unlabored  Chest Wall:    No tenderness or deformity   Heart:    Regular rate and rhythm, S1 and S2 normal, no murmur, rub   or gallop  Breast Exam:    No tenderness, masses, or nipple abnormality  Abdomen:     Soft, non-tender, bowel sounds active all four quadrants,    no masses, no organomegaly  Genitalia:    Normal female without lesion, discharge or tenderness     Extremities:   Extremities normal, atraumatic, no cyanosis or edema  Pulses:   2+ and symmetric all extremities  Skin:   Skin color, texture, turgor normal, no rashes or lesions     Assessment:    Healthy female exam.   Tobacco use counseling menopausal state   Plan:    Follow up in: 1 year.    [place] today- pt would like to confirm menopause f/u PAP with [facility] Spent time with pt reviewing the risks  of tobacco use and its relationship to heart disease.   Westlynn Fifer L. Harraway-Smith, M.D., [name]

## 2016-09-20 LAB — CYTOLOGY - PAP
DIAGNOSIS: NEGATIVE
HPV (WINDOPATH): NOT DETECTED

## 2016-09-27 ENCOUNTER — Other Ambulatory Visit: Payer: Self-pay | Admitting: Family Medicine

## 2016-09-28 ENCOUNTER — Encounter: Payer: Self-pay | Admitting: Family Medicine

## 2016-09-28 ENCOUNTER — Telehealth: Payer: Self-pay | Admitting: Family Medicine

## 2016-09-28 MED ORDER — FUROSEMIDE 20 MG PO TABS: 10.0000 mg | ORAL_TABLET | Freq: Every day | ORAL | 1 refills | Status: DC

## 2016-09-28 NOTE — Telephone Encounter (Signed)
Caller name: Relationship to patient: Self Can be reached: [phone]  Pharmacy:  Reason for call:Patient sent My Chart message stating she was notified that she is due to have the following things done. Hepatitis C Screening  Foot Exam  Ophthalmology Exam  Urine Microalbumin  Tetanus/Tdap  Colonoscopy   Patient wants to know which ones she needs and get them scheduled. Plse adv.

## 2016-11-25 ENCOUNTER — Encounter: Payer: Self-pay | Admitting: Family Medicine

## 2016-11-29 MED ORDER — FUROSEMIDE 20 MG PO TABS: 20.0000 mg | ORAL_TABLET | Freq: Every day | ORAL | 1 refills | Status: DC

## 2016-11-29 MED ORDER — FUROSEMIDE 20 MG PO TABS: 10.0000 mg | ORAL_TABLET | Freq: Every day | ORAL | 1 refills | Status: DC

## 2016-11-29 MED ORDER — AMLODIPINE BESYLATE 10 MG PO TABS: 10.0000 mg | ORAL_TABLET | Freq: Every day | ORAL | 1 refills | Status: DC

## 2016-11-29 NOTE — Addendum Note (Signed)
Addended byDamita Dunnings D on: 11/29/2016 12:03 PM   Modules accepted: Orders

## 2017-01-22 ENCOUNTER — Other Ambulatory Visit: Payer: Self-pay | Admitting: Family Medicine

## 2017-01-23 NOTE — Telephone Encounter (Signed)
Last filled:  11/29/16 Amt: 30,1 Last OV: 07/28/16 Last K: 08/23/16---3.4  Please advise.  Refill?

## 2017-01-24 NOTE — Telephone Encounter (Signed)
Okay #30, no refills 

## 2017-01-28 ENCOUNTER — Other Ambulatory Visit: Payer: Self-pay | Admitting: Family Medicine

## 2017-03-31 ENCOUNTER — Other Ambulatory Visit: Payer: Self-pay | Admitting: Internal Medicine

## 2017-04-12 ENCOUNTER — Encounter: Payer: Self-pay | Admitting: Family Medicine

## 2017-04-12 ENCOUNTER — Telehealth: Payer: Self-pay | Admitting: Family Medicine

## 2017-04-13 ENCOUNTER — Encounter: Payer: Self-pay | Admitting: Family Medicine

## 2017-04-13 MED ORDER — AMLODIPINE BESYLATE 10 MG PO TABS: 10.0000 mg | ORAL_TABLET | Freq: Every day | ORAL | 1 refills | Status: DC

## 2017-04-13 MED ORDER — FUROSEMIDE 20 MG PO TABS: 10.0000 mg | ORAL_TABLET | Freq: Every day | ORAL | 1 refills | Status: DC

## 2017-04-13 NOTE — Telephone Encounter (Signed)
Pt is requesting refill on Lasix 20mg .

## 2017-04-18 ENCOUNTER — Encounter (HOSPITAL_COMMUNITY): Payer: Self-pay

## 2017-06-30 IMAGING — CR DG CHEST 2V
2 series · 2 of 2 positions shown · non-contrast
Comparison: None.

CLINICAL DATA: Bariatric surgery.

EXAM:
CHEST  2 VIEW

[w chest pa]
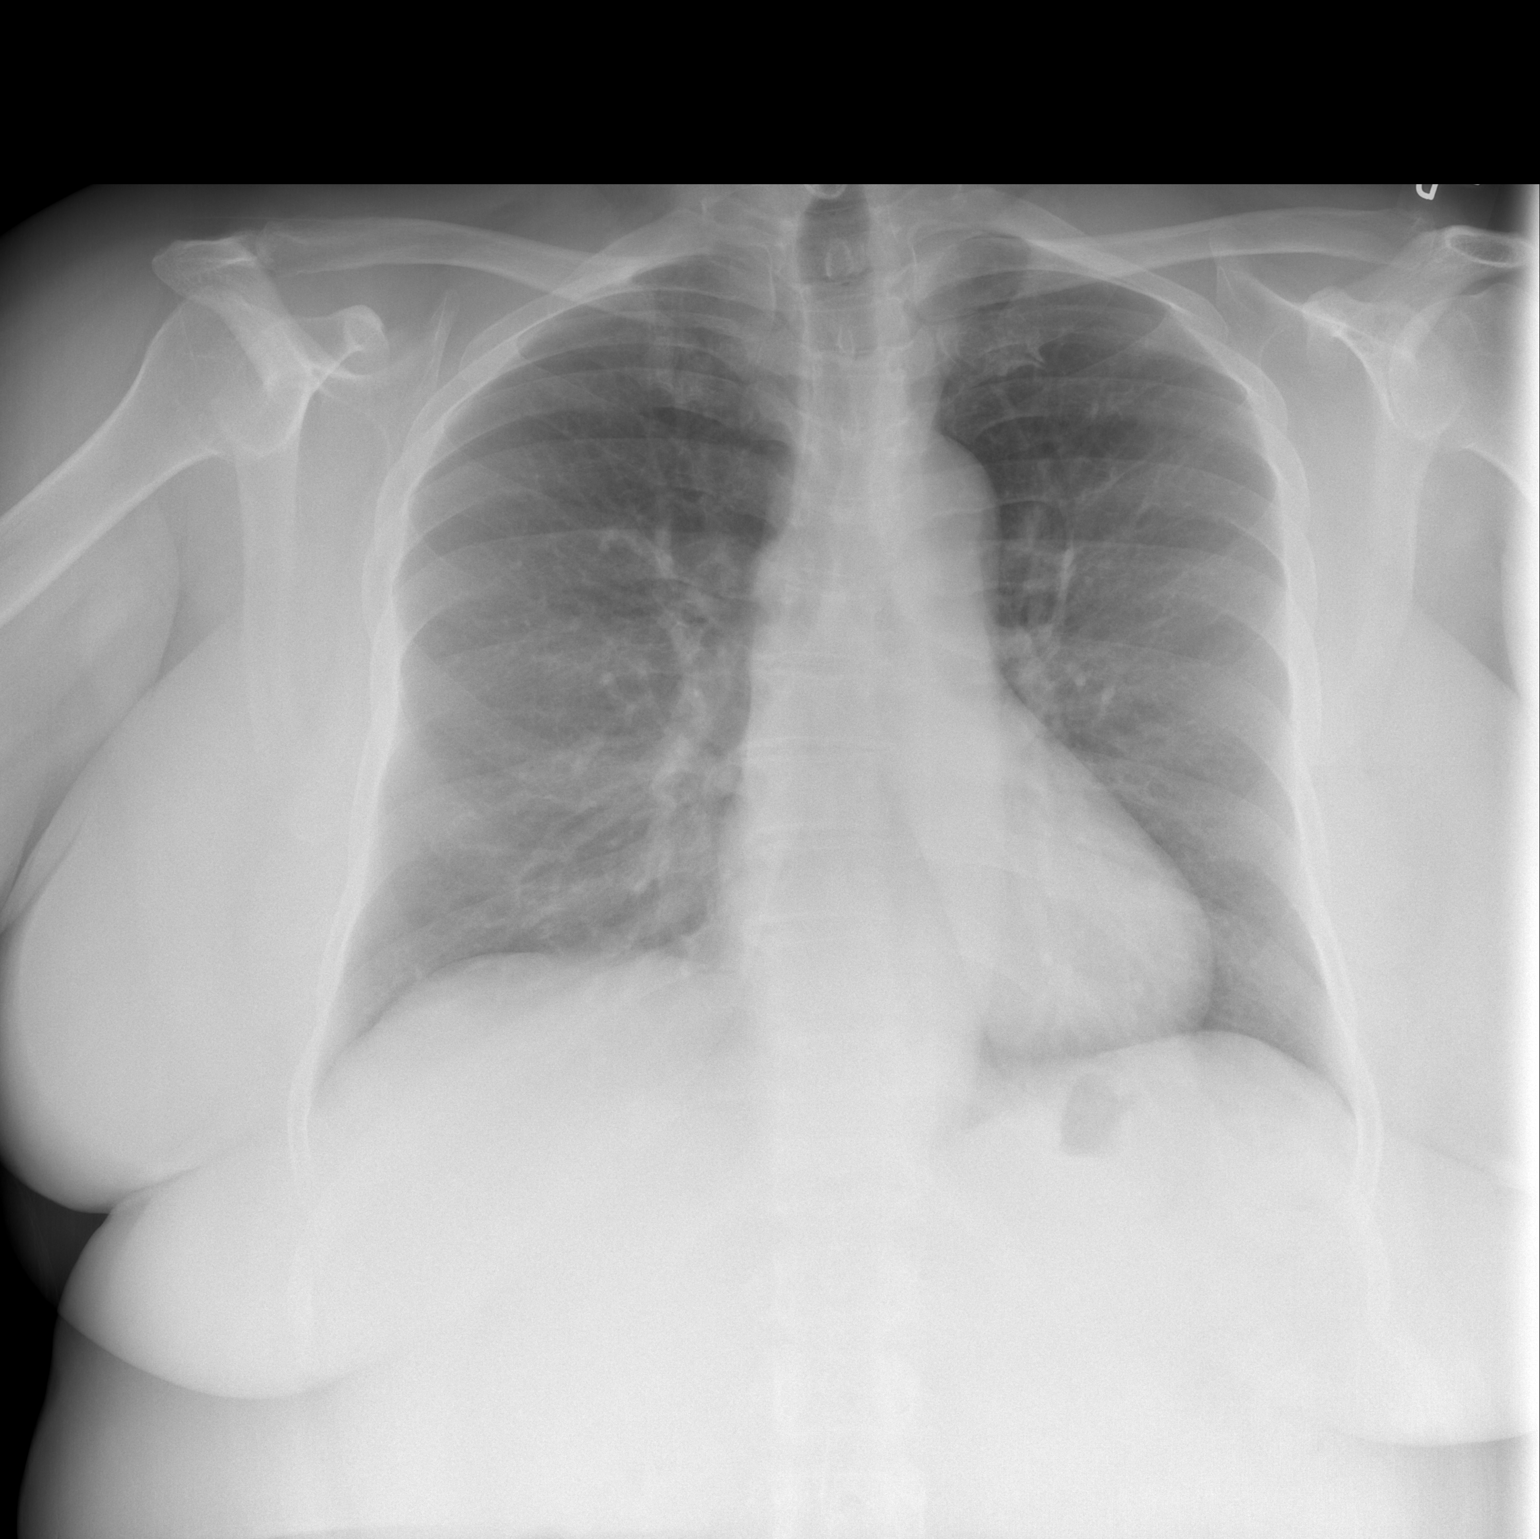

[w chest lat]
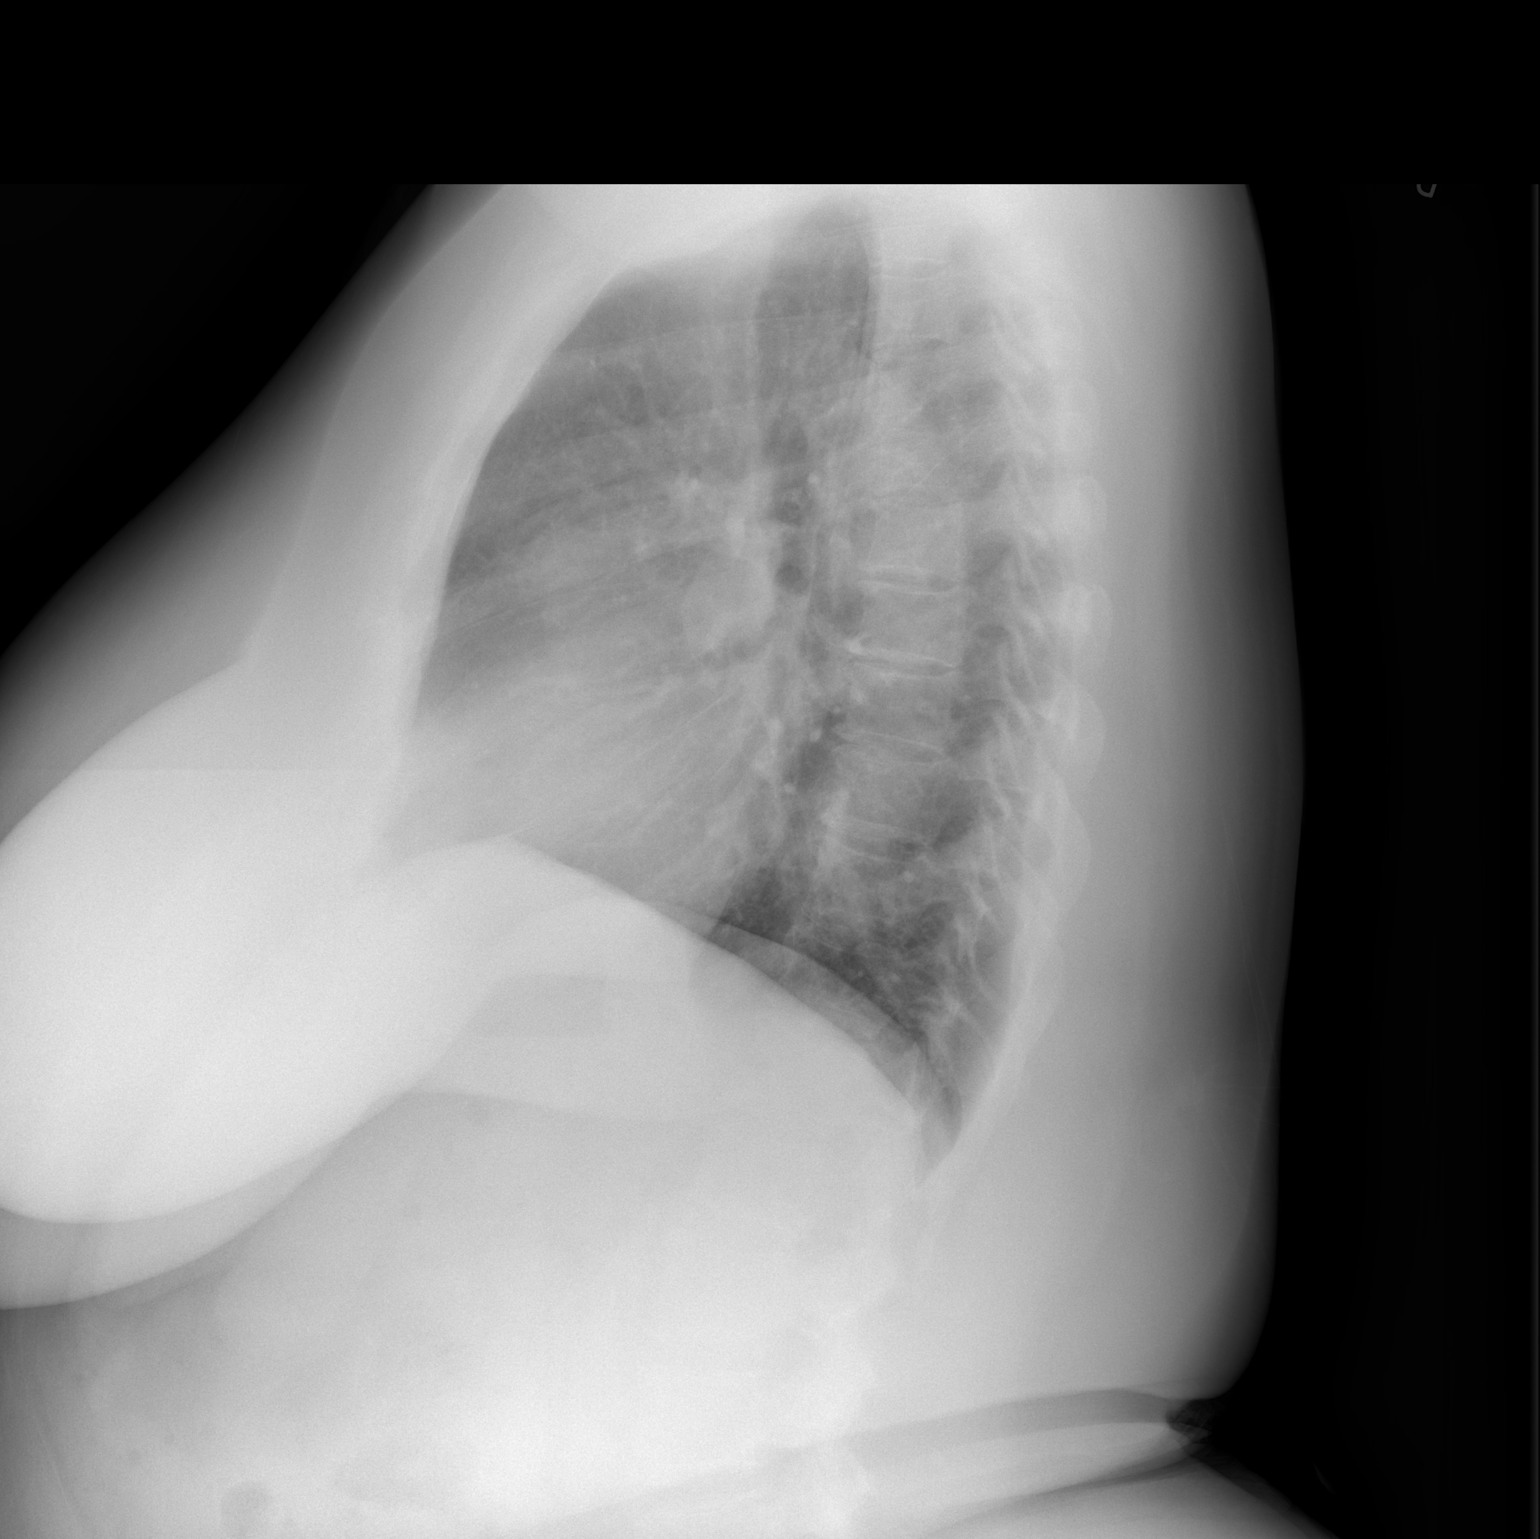

[2 of 2 positions shown; findings below may reference images not displayed]

FINDINGS: Mediastinum and hilar structures normal. Lungs are clear. Heart size
normal. No pleural effusion pneumothorax. Prior cervical spine
fusion.
IMPRESSION: No acute cardiopulmonary disease.

## 2017-08-23 ENCOUNTER — Ambulatory Visit: Payer: Self-pay | Admitting: *Deleted

## 2017-08-23 NOTE — Telephone Encounter (Signed)
See  Other  Triage  Encounter   Pt will call back

## 2017-08-23 NOTE — Telephone Encounter (Signed)
Patient called and reported that she has had improvement of symptoms- worked patient through triage protocol and she is OK to wait until Friday. Patient instructed if anything changes- she is to call back for earlier appointment.  Reason for Disposition . [1] Sore throat is the only symptom AND [2] present > 48 hours  Answer Assessment - Initial Assessment Questions 1. ONSET: "When did the throat start hurting?" (Hours or days ago)      2 days 2. SEVERITY: "How bad is the sore throat?" (Scale 1-10; mild, moderate or severe)   - MILD (1-3):  doesn't interfere with eating or normal activities   - MODERATE (4-7): interferes with eating some solids and normal activities   - SEVERE (8-10):  excruciating pain, interferes with most normal activities   - SEVERE DYSPHAGIA: can't swallow liquids, drooling     Today-4 3. STREP EXPOSURE: "Has there been any exposure to strep within the past week?" If so, ask: "What type of contact occurred?"      Not that she knows 4.  VIRAL SYMPTOMS: "Are there any symptoms of a cold, such as a runny nose, cough, hoarse voice or red eyes?"      Hoarse voice, some head symptoms- not feeling right 5. FEVER: "Do you have a fever?" If so, ask: "What is your temperature, how was it measured, and when did it start?"     no 6. PUS ON THE TONSILS: "Is there pus on the tonsils in the back of your throat?"     no 7. OTHER SYMPTOMS: "Do you have any other symptoms?" (e.g., difficulty breathing, headache, rash)     Headache- yesterday 8. PREGNANCY: "Is there any chance you are pregnant?" "When was your last menstrual period?"     n/a  Protocols used: SORE THROAT-A-AH

## 2017-08-24 ENCOUNTER — Encounter: Payer: Self-pay | Admitting: Family Medicine

## 2017-08-24 ENCOUNTER — Ambulatory Visit: Payer: BC Managed Care – PPO | Admitting: Family Medicine

## 2017-08-24 VITALS — BP 110/78 | HR 104 | Temp 98.2°F | Ht 63.0 in | Wt 209.0 lb

## 2017-08-24 DIAGNOSIS — R2681 Unsteadiness on feet: Secondary | ICD-10-CM | POA: Diagnosis not present

## 2017-08-24 NOTE — Patient Instructions (Signed)
Stay well hydrated.  1-2 days to get results of your labs back.  Let us know if you need anything.

## 2017-08-24 NOTE — Progress Notes (Signed)
Pre visit review using our clinic review tool, if applicable. No additional management support is needed unless otherwise documented below in the visit note. 

## 2017-08-24 NOTE — Progress Notes (Signed)
Chief Complaint  Patient presents with  . Dizziness    sleeping alot    Misty Bell is 62 y.o. pt here for unsteadiness.  Duration: 2 days  Pass out? No Spinning/Lightheaded? No Recent illness/fever? No Headache? No Neurologic signs? No Change in PO intake? No  Med change? No She does not check BP at home. Denies any areas of bleeding, including in her stool. No wt changes. Denies chest pain, palpitations, or shortness of breath. She is not having any pain. She normally lowers temp in home when she sleeps, forgot to the night before this issue started.  ROS:  Neuro: As noted in HPI Eyes: No vision changes  Past Medical History:  Diagnosis Date  . Anxiety   . Anxiety disorder   . Back pain   . Diabetes mellitus type 2 in obese ([place])   . Hypertension 2010  . Migraine   . Primary osteoarthritis of both knees   . Snoring   . Varicose veins     Family History  Problem Relation Age of Onset  . Heart disease Mother   . Cervical cancer Mother   . Diabetes Other        [place]  . Heart disease Other        female >41, [facility]  . Colon cancer Neg Hx     Allergies as of 08/24/2017   No Known Allergies     Medication List        Accurate as of 08/24/17  4:46 PM. Always use your most recent med list.          amLODipine 10 MG tablet Commonly known as:  NORVASC Take 1 tablet (10 mg total) by mouth daily.   furosemide 20 MG tablet Commonly known as:  LASIX Take 0.5 tablets (10 mg total) by mouth daily.       BP 110/78 (BP Location: Left Arm)   Pulse (!) 104   Temp 98.2 F (36.8 C) (Oral)   Ht 5\' 3"  (1.6 m)   Wt 209 lb (94.8 kg)   SpO2 96%   BMI 37.02 kg/m  General: Awake, alert, appears stated age Eyes: PERRLA, EOMi Ears: Patent, TM's neg b/l Heart: RRR, no carotid bruits Lungs: CTAB, no accessory muscle use MSK: 5/5 strength throughout, gait normal Neuro: No cerebellar signs, patellar reflex 1/4 b/l wo clonus, calcaneal reflex 0/4 b/l wo  clonus, biceps reflex 1/4 b/l wo clonus. Psych: Age appropriate judgment and insight, normal mood and affect  Unsteady - Plan: CBC, Comprehensive metabolic panel, TSH   Push fluids. Could be related to warmer temps and she got dehydrated? Ck labs. If unremarkable and still having issues, will order 24 hour blood pressure monitoring and get EKG with her famhx of heart disease. I do not think this is high yield today. F/u prn. Pt voiced understanding and agreement to the plan.  Vayas, DO 08/24/17 4:46 PM

## 2017-08-25 ENCOUNTER — Ambulatory Visit: Payer: BC Managed Care – PPO | Admitting: Family Medicine

## 2017-08-25 LAB — COMPREHENSIVE METABOLIC PANEL
ALT: 12 U/L (ref 0–35)
AST: 14 U/L (ref 0–37)
Albumin: 3.5 g/dL (ref 3.5–5.2)
Alkaline Phosphatase: 81 U/L (ref 39–117)
BUN: 16 mg/dL (ref 6–23)
CHLORIDE: 103 meq/L (ref 96–112)
CO2: 29 meq/L (ref 19–32)
CREATININE: 0.6 mg/dL (ref 0.40–1.20)
Calcium: 9.6 mg/dL (ref 8.4–10.5)
GFR: 131.3 mL/min (ref >60.00)
GLUCOSE: 101 mg/dL — AB (ref 70–99)
Potassium: 3.9 mEq/L (ref 3.5–5.1)
Sodium: 139 mEq/L (ref 135–145)
Total Bilirubin: 0.2 mg/dL (ref 0.2–1.2)
Total Protein: 7.3 g/dL (ref 6.0–8.3)

## 2017-08-25 LAB — CBC
HCT: 41.2 % (ref 36.0–46.0)
Hemoglobin: 14 g/dL (ref 12.0–15.0)
MCHC: 33.9 g/dL (ref 30.0–36.0)
MCV: 89.4 fl (ref 78.0–100.0)
Platelets: 376 10*3/uL (ref 150.0–400.0)
RBC: 4.62 Mil/uL (ref 3.87–5.11)
RDW: 14.6 % (ref 11.5–15.5)
WBC: 11.1 10*3/uL — ABNORMAL HIGH (ref 4.0–10.5)

## 2017-08-25 LAB — TSH: TSH: 0.97 u[IU]/mL (ref 0.35–4.50)

## 2017-09-07 ENCOUNTER — Encounter (INDEPENDENT_AMBULATORY_CARE_PROVIDER_SITE_OTHER): Payer: BC Managed Care – PPO

## 2017-09-19 NOTE — Progress Notes (Addendum)
[place] [address], [place], [place] [address] [phone]  Date:  09/21/2017   Name:  Misty Bell   DOB:  1955/11/28   MRN:  [id number]  PCP:  Darreld Mclean, MD    Chief Complaint: Annual Exam (Pt here for CPE)   History of Present Illness:  Misty Bell is a 62 y.o. very pleasant female patient who presents with the following:  Here today for a CPE She is not fasting today- however just ate a piece of chicken so far,  Will go ahead and get labs  Last seen here by myself about a year ago.  She was also in with a feeling of unsteadiness last month and saw Dr. Nani Ravens, he did some labs for her which looked ok.  She notes that she has lost her sister recently which is the 52rd of her sibs to die recently. This has been a lot for her to handle and is affecting her mood She is really having a hard time sleeping- she takes a long time to fall asleep and cannot stay asleep  She used some xanax in the past for sleep which did help her but she woud prefer a non habit forming medication Admits that she is suffering from some depression right now as well, but has no SI.    She is on amlodipine and lasix  He did do a CMP and CBC for her at that time  History of high cholesterol, back pain, obesity, DM which is controlled not currently on medication, anxiety  Gastric sleeve 2 years ago - she is disappointed that she has gained some of her weight back as of later  She does have 7 grandchildren- 5 in [place] and 2 in [facility] and does get to visit them a lot. Seeing her grands does cheer her up  Lab Results  Component Value Date   HGBA1C 6.0 (H) 08/23/2016   Foot exam due- done today Eye exam due- done in December Flu shot: did not have, will give today Colon cancer screening: she is due for a 5 year foll Tetanus vaccine: 2015 GYN care- Dr. 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 Hypercholesterolemia 09/14/2015  . Obesity, Class III, BMI 40-49.9 (morbid obesity) ([place]) 09/14/2015  . Hiatal hernia 09/14/2015  . Osteoarthritis of both knees 09/14/2015  . DOE (dyspnea on exertion) 09/02/2015  . Anxiety disorder 04/01/2015  . Back pain 04/01/2015  . Diabetes mellitus type 2 in obese ([place]) 04/01/2015  . Essential hypertension 04/01/2015  . Migraine 04/01/2015  . Spider nevus of skin 10/29/2012    Past Medical History:  Diagnosis Date  . Anxiety   . Anxiety disorder   . Back pain   . Diabetes mellitus type 2 in obese ([place])   . Hypertension 2010  . Migraine   . Primary osteoarthritis of both knees   . Snoring   . Varicose veins     Past Surgical History:  Procedure Laterality Date  . COLONOSCOPY    . DILATION AND CURETTAGE OF UTERUS  years ago-several  . FRACTURE SURGERY     neck  . HIATAL HERNIA REPAIR  09/14/2015   Procedure: LAPAROSCOPIC REPAIR OF HIATAL HERNIA;  Surgeon: Greer Pickerel, MD;  Location: [facility];  Service: General;;  . LAPAROSCOPIC GASTRIC SLEEVE RESECTION N/A 09/14/2015   Procedure: LAPAROSCOPIC GASTRIC SLEEVE RESECTION W/UPPER ENDO;  Surgeon: Greer Pickerel, MD;  Location: [facility];  Service: General;  Laterality: N/A;  . uterine ablation  08/2010    Social History   Tobacco Use  . Smoking status: Former Smoker    Types: Cigarettes    Last attempt to quit: 07/27/2015    Years since quitting: 2.1  . Smokeless tobacco: Never Used  Substance Use Topics  . Alcohol use: Yes  . Drug use: No    Family History  Problem Relation Age of Onset  . Heart disease Mother   . Cervical cancer Mother   . Diabetes Other        [place]  . Heart  disease Other        female >66, [facility]  . Colon cancer Neg Hx     No Known Allergies  Medication list has been reviewed and updated.  Current Outpatient Medications on File Prior to Visit  Medication Sig Dispense Refill  . amLODipine (NORVASC) 10 MG tablet Take 1 tablet (10 mg total) by mouth daily. 90 tablet 1  . furosemide (LASIX) 20 MG tablet Take 0.5 tablets (10 mg total) by mouth daily. 45 tablet 1   No current facility-administered medications on file prior to visit.     Review of Systems:  As per HPI- otherwise negative.   Physical Examination: Vitals:   09/21/17 0931  BP: 130/82  Pulse: 96  Temp: (!) 97.5 F (36.4 C)  SpO2: 97%   Vitals:   09/21/17 0931  Weight: 207 lb 9.6 oz (94.2 kg)  Height: 5\' 2"  (1.575 m)   Body mass index is 37.97 kg/m. Ideal Body Weight: Weight in (lb) to have BMI = 25: 136.4  GEN: WDWN, NAD, Non-toxic, A & O x 3, obese, otherwise looks well  HEENT: Atraumatic, Normocephalic. Neck supple. No masses, No LAD.  Bilateral TM wnl, oropharynx normal.  PEERL,EOMI.   Ears and Nose: No external deformity. CV: RRR, No M/G/R. No JVD. No thrill. No extra heart sounds. PULM: CTA B, no wheezes, crackles, rhonchi. No retractions. No resp. distress. No accessory muscle use. ABD: S, NT, ND, +BS. No rebound. No HSM. EXTR: No c/c/e NEURO Normal gait.  PSYCH: Normally interactive. Conversant. Not depressed or anxious appearing.  Calm demeanor.    Assessment and Plan: Essential hypertension - Plan: CBC  Controlled type 2 diabetes mellitus without complication, without long-term current use of insulin (HCC) - Plan: Hemoglobin A1c, Microalbumin / creatinine urine ratio  Hyperlipidemia associated with type 2 diabetes mellitus ([place]) - Plan: Lipid panel  Encounter for hepatitis C screening test for low risk patient - Plan: Hepatitis C antibody  Pedal edema - Plan: furosemide (LASIX) 20 MG tablet  Screening for colon cancer  Adjustment insomnia -  Plan: traZODone (DESYREL) 50 MG tablet  Flu shot given today She would like to try trazodone for insomnia, and it may also assist with her depression See patient instructions for more details.   Will plan further follow- up pending labs.  Signed Lamar Blinks, MD   Received her labs, message to pt  Your labs overall look very good HIV and Hep C negative as expected Your cholesterol is not great, but  not terrible either.  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 No sign of abnormal protein in your urine  Blood counts look fine  Take care, please see me in about 6 months,  Results for orders placed or performed in visit on 09/21/17  Lipid panel  Result Value Ref Range   Cholesterol 245 (H) 0 - 200 mg/dL   Triglycerides 107.0 0.0 - 149.0 mg/dL   HDL 66.80 >39.00 mg/dL   VLDL 21.4 0.0 - 40.0 mg/dL   LDL Cholesterol 157 (H) 0 - 99 mg/dL   Total CHOL/HDL Ratio 4    NonHDL 178.45   Hemoglobin A1c  Result Value Ref Range   Hgb A1c MFr Bld 6.3 4.6 - 6.5 %  Hepatitis C antibody  Result Value Ref Range   Hepatitis C Ab NON-REACTIVE NON-REACTI   SIGNAL TO CUT-OFF 0.04 <1.00  Microalbumin / creatinine urine ratio  Result Value Ref Range   Microalb, Ur <0.7 0.0 - 1.9 mg/dL   Creatinine,U 105.1 mg/dL   Microalb Creat Ratio 0.7 0.0 - 30.0 mg/g  CBC  Result Value Ref Range   WBC 9.7 4.0 - 10.5 K/uL   RBC 4.80 3.87 - 5.11 Mil/uL   Platelets 360.0 150.0 - 400.0 K/uL   Hemoglobin 14.5 12.0 - 15.0 g/dL   HCT 43.2 36.0 - 46.0 %   MCV 89.9 78.0 - 100.0 fl   MCHC 33.5 30.0 - 36.0 g/dL   RDW 14.5 11.5 - 15.5 %  HIV antibody  Result Value Ref Range   HIV 1&2 Ab, 4th Generation NON-REACTIVE NON-REACTI

## 2017-09-21 ENCOUNTER — Other Ambulatory Visit: Payer: Self-pay | Admitting: Family Medicine

## 2017-09-21 ENCOUNTER — Ambulatory Visit (HOSPITAL_BASED_OUTPATIENT_CLINIC_OR_DEPARTMENT_OTHER)
Admission: RE | Admit: 2017-09-21 | Discharge: 2017-09-21 | Disposition: A | Discharge status: Home / Self Care | Payer: BC Managed Care – PPO | Source: Ambulatory Visit | Attending: Family Medicine | Admitting: Family Medicine

## 2017-09-21 ENCOUNTER — Encounter: Payer: Self-pay | Admitting: Family Medicine

## 2017-09-21 ENCOUNTER — Encounter: Payer: BC Managed Care – PPO | Admitting: Family Medicine

## 2017-09-21 ENCOUNTER — Ambulatory Visit (INDEPENDENT_AMBULATORY_CARE_PROVIDER_SITE_OTHER): Payer: BC Managed Care – PPO | Admitting: Family Medicine

## 2017-09-21 VITALS — BP 130/82 | HR 96 | Temp 97.5°F | Ht 62.0 in | Wt 207.6 lb

## 2017-09-21 DIAGNOSIS — E1169 Type 2 diabetes mellitus with other specified complication: Secondary | ICD-10-CM | POA: Diagnosis not present

## 2017-09-21 DIAGNOSIS — F5102 Adjustment insomnia: Secondary | ICD-10-CM

## 2017-09-21 DIAGNOSIS — Z1159 Encounter for screening for other viral diseases: Secondary | ICD-10-CM

## 2017-09-21 DIAGNOSIS — E119 Type 2 diabetes mellitus without complications: Secondary | ICD-10-CM | POA: Diagnosis not present

## 2017-09-21 DIAGNOSIS — Z114 Encounter for screening for human immunodeficiency virus [HIV]: Secondary | ICD-10-CM

## 2017-09-21 DIAGNOSIS — R600 Localized edema: Secondary | ICD-10-CM

## 2017-09-21 DIAGNOSIS — Z1231 Encounter for screening mammogram for malignant neoplasm of breast: Secondary | ICD-10-CM

## 2017-09-21 DIAGNOSIS — Z23 Encounter for immunization: Secondary | ICD-10-CM

## 2017-09-21 DIAGNOSIS — Z1211 Encounter for screening for malignant neoplasm of colon: Secondary | ICD-10-CM | POA: Diagnosis not present

## 2017-09-21 DIAGNOSIS — I10 Essential (primary) hypertension: Secondary | ICD-10-CM | POA: Diagnosis not present

## 2017-09-21 DIAGNOSIS — E785 Hyperlipidemia, unspecified: Secondary | ICD-10-CM | POA: Diagnosis not present

## 2017-09-21 LAB — LIPID PANEL
CHOL/HDL RATIO: 4
CHOLESTEROL: 245 mg/dL — AB (ref 0–200)
HDL: 66.8 mg/dL (ref >39.00)
LDL CALC: 157 mg/dL — AB (ref 0–99)
NONHDL: 178.45
Triglycerides: 107 mg/dL (ref 0.0–149.0)
VLDL: 21.4 mg/dL (ref 0.0–40.0)

## 2017-09-21 LAB — MICROALBUMIN / CREATININE URINE RATIO
Creatinine,U: 105.1 mg/dL
Microalb Creat Ratio: 0.7 mg/g (ref 0.0–30.0)
Microalb, Ur: <0.7 mg/dL (ref 0.0–1.9)

## 2017-09-21 LAB — HEMOGLOBIN A1C: HEMOGLOBIN A1C: 6.3 % (ref 4.6–6.5)

## 2017-09-21 LAB — CBC
HCT: 43.2 % (ref 36.0–46.0)
Hemoglobin: 14.5 g/dL (ref 12.0–15.0)
MCHC: 33.5 g/dL (ref 30.0–36.0)
MCV: 89.9 fl (ref 78.0–100.0)
PLATELETS: 360 10*3/uL (ref 150.0–400.0)
RBC: 4.8 Mil/uL (ref 3.87–5.11)
RDW: 14.5 % (ref 11.5–15.5)
WBC: 9.7 10*3/uL (ref 4.0–10.5)

## 2017-09-21 MED ORDER — FUROSEMIDE 20 MG PO TABS: 20.0000 mg | ORAL_TABLET | Freq: Every day | ORAL | 3 refills | Status: DC

## 2017-09-21 MED ORDER — TRAZODONE HCL 50 MG PO TABS: 25.0000 mg | ORAL_TABLET | Freq: Every evening | ORAL | 9 refills | Status: DC | PRN

## 2017-09-21 NOTE — Patient Instructions (Addendum)
Please call GI and schedule a follow-up visit- Dr. Melina Copa [facility]STRO[facility]OLOGY [facility] GASTROENTEROLOGY [place] [address] Dept: [phone] Dept Fax: [phone]  Good to see you today!  I refilled your furosemide to reflect your correct dosing I will be in touch with your labs We will try trazodone for sleep for you- start with a 1/2 tablet and increase to a whole tablet if needed.  Please let me know how this works for you  If you are not doing ok from a mood standpoint please alert me!   Health Maintenance, Female Adopting a healthy lifestyle and getting preventive care can go a long way to promote health and wellness. Talk with your health care provider about what schedule of regular examinations is right for you. This is a good chance for you to check in with your provider about disease prevention and staying healthy. In between checkups, there are plenty of things you can do on your own. Experts have done a lot of research about which lifestyle changes and preventive measures are most likely to keep you healthy. Ask your health care provider for more information. Weight and diet Eat a healthy diet  Be sure to include plenty of vegetables, fruits, low-fat dairy products, and lean protein.  Do not eat a lot of foods high in solid fats, added sugars, or salt.  Get regular exercise. This is one of the most important things you can do for your health. ? Most adults should exercise for at least 150 minutes each week. The exercise should increase your heart rate and make you sweat (moderate-intensity exercise). ? Most adults should also do strengthening exercises at least twice a week. This is in addition to the moderate-intensity exercise.  Maintain a healthy weight  Body mass index (BMI) is a measurement that can be used to identify possible weight problems. It estimates body fat based on height and weight. Your health care provider can help  determine your BMI and help you achieve or maintain a healthy weight.  For females 35 years of age and older: ? A BMI below 18.5 is considered underweight. ? A BMI of 18.5 to 24.9 is normal. ? A BMI of 25 to 29.9 is considered overweight. ? A BMI of 30 and above is considered obese.  Watch levels of cholesterol and blood lipids  You should start having your blood tested for lipids and cholesterol at 60 years of age, then have this test every 5 years.  You may need to have your cholesterol levels checked more often if: ? Your lipid or cholesterol levels are high. ? You are older than 60 years of age. ? You are at high risk for heart disease.  Cancer screening Lung Cancer  Lung cancer screening is recommended for adults 52-43 years old who are at high risk for lung cancer because of a history of smoking.  A yearly low-dose CT scan of the lungs is recommended for people who: ? Currently smoke. ? Have quit within the past 15 years. ? Have at least a 30-pack-year history of smoking. A pack year is smoking an average of one pack of cigarettes a day for 1 year.  Yearly screening should continue until it has been 15 years since you quit.  Yearly screening should stop if you develop a health problem that would prevent you from having lung cancer treatment.  Breast Cancer  Practice breast self-awareness. This means understanding how your breasts normally appear and feel.  It also means doing regular breast self-exams. Let your health care provider know about any changes, no matter how small.  If you are in your 20s or 30s, you should have a clinical breast exam (CBE) by a health care provider every 1-3 years as part of a regular health exam.  If you are 43 or older, have a CBE every year. Also consider having a breast X-ray (mammogram) every year.  If you have a family history of breast cancer, talk to your health care provider about genetic screening.  If you are at high risk for  breast cancer, talk to your health care provider about having an MRI and a mammogram every year.  Breast cancer gene (BRCA) assessment is recommended for women who have family members with BRCA-related cancers. BRCA-related cancers include: ? Breast. ? Ovarian. ? Tubal. ? Peritoneal cancers.  Results of the assessment will determine the need for genetic counseling and BRCA1 and BRCA2 testing.  Cervical Cancer Your health care provider may recommend that you be screened regularly for cancer of the pelvic organs (ovaries, uterus, and vagina). This screening involves a pelvic examination, including checking for microscopic changes to the surface of your cervix (Pap test). You may be encouraged to have this screening done every 3 years, beginning at age 39.  For women ages 33-65, health care providers may recommend pelvic exams and Pap testing every 3 years, or they may recommend the Pap and pelvic exam, combined with testing for human papilloma virus (HPV), every 5 years. Some types of HPV increase your risk of cervical cancer. Testing for HPV may also be done on women of any age with unclear Pap test results.  Other health care providers may not recommend any screening for nonpregnant women who are considered low risk for pelvic cancer and who do not have symptoms. Ask your health care provider if a screening pelvic exam is right for you.  If you have had past treatment for cervical cancer or a condition that could lead to cancer, you need Pap tests and screening for cancer for at least 20 years after your treatment. If Pap tests have been discontinued, your risk factors (such as having a new sexual partner) need to be reassessed to determine if screening should resume. Some women have medical problems that increase the chance of getting cervical cancer. In these cases, your health care provider may recommend more frequent screening and Pap tests.  Colorectal Cancer  This type of cancer can be  detected and often prevented.  Routine colorectal cancer screening usually begins at 60 years of age and continues through 60 years of age.  Your health care provider may recommend screening at an earlier age if you have risk factors for colon cancer.  Your health care provider may also recommend using home test kits to check for hidden blood in the stool.  A small camera at the end of a tube can be used to examine your colon directly (sigmoidoscopy or colonoscopy). This is done to check for the earliest forms of colorectal cancer.  Routine screening usually begins at age 58.  Direct examination of the colon should be repeated every 5-10 years through 60 years of age. However, you may need to be screened more often if early forms of precancerous polyps or small growths are found.  Skin Cancer  Check your skin from head to toe regularly.  Tell your health care provider about any new moles or changes in moles, especially if there is a  change in a mole's shape or color.  Also tell your health care provider if you have a mole that is larger than the size of a pencil eraser.  Always use sunscreen. Apply sunscreen liberally and repeatedly throughout the day.  Protect yourself by wearing long sleeves, pants, a wide-brimmed hat, and sunglasses whenever you are outside.  Heart disease, diabetes, and high blood pressure  High blood pressure causes heart disease and increases the risk of stroke. High blood pressure is more likely to develop in: ? People who have blood pressure in the high end of the normal range (130-139/85-89 mm Hg). ? People who are overweight or obese. ? People who are African American.  If you are 66-32 years of age, have your blood pressure checked every 3-5 years. If you are 35 years of age or older, have your blood pressure checked every year. You should have your blood pressure measured twice-once when you are at a hospital or clinic, and once when you are not at a  hospital or clinic. Record the average of the two measurements. To check your blood pressure when you are not at a hospital or clinic, you can use: ? An automated blood pressure machine at a pharmacy. ? A home blood pressure monitor.  If you are between 92 years and 31 years old, ask your health care provider if you should take aspirin to prevent strokes.  Have regular diabetes screenings. This involves taking a blood sample to check your fasting blood sugar level. ? If you are at a normal weight and have a low risk for diabetes, have this test once every three years after 60 years of age. ? If you are overweight and have a high risk for diabetes, consider being tested at a younger age or more often. Preventing infection Hepatitis B  If you have a higher risk for hepatitis B, you should be screened for this virus. You are considered at high risk for hepatitis B if: ? You were born in a country where hepatitis B is common. Ask your health care provider which countries are considered high risk. ? Your parents were born in a high-risk country, and you have not been immunized against hepatitis B (hepatitis B vaccine). ? You have HIV or AIDS. ? You use needles to inject street drugs. ? You live with someone who has hepatitis B. ? You have had sex with someone who has hepatitis B. ? You get hemodialysis treatment. ? You take certain medicines for conditions, including cancer, organ transplantation, and autoimmune conditions.  Hepatitis C  Blood testing is recommended for: ? Everyone born from [birth year] through 1963. ? Anyone with known risk factors for hepatitis C.  Sexually transmitted infections (STIs)  You should be screened for sexually transmitted infections (STIs) including gonorrhea and chlamydia if: ? You are sexually active and are younger than 60 years of age. ? You are older than 60 years of age and your health care provider tells you that you are at risk for this type of  infection. ? Your sexual activity has changed since you were last screened and you are at an increased risk for chlamydia or gonorrhea. Ask your health care provider if you are at risk.  If you do not have HIV, but are at risk, it may be recommended that you take a prescription medicine daily to prevent HIV infection. This is called pre-exposure prophylaxis (PrEP). You are considered at risk if: ? You are sexually active and do not regularly  use condoms or know the HIV status of your partner(s). ? You take drugs by injection. ? You are sexually active with a partner who has HIV.  Talk with your health care provider about whether you are at high risk of being infected with HIV. If you choose to begin PrEP, you should first be tested for HIV. You should then be tested every 3 months for as long as you are taking PrEP. Pregnancy  If you are premenopausal and you may become pregnant, ask your health care provider about preconception counseling.  If you may become pregnant, take 400 to 800 micrograms (mcg) of folic acid every day.  If you want to prevent pregnancy, talk to your health care provider about birth control (contraception). Osteoporosis and menopause  Osteoporosis is a disease in which the bones lose minerals and strength with aging. This can result in serious bone fractures. Your risk for osteoporosis can be identified using a bone density scan.  If you are 30 years of age or older, or if you are at risk for osteoporosis and fractures, ask your health care provider if you should be screened.  Ask your health care provider whether you should take a calcium or vitamin D supplement to lower your risk for osteoporosis.  Menopause may have certain physical symptoms and risks.  Hormone replacement therapy may reduce some of these symptoms and risks. Talk to your health care provider about whether hormone replacement therapy is right for you. Follow these instructions at home:  Schedule  regular health, dental, and eye exams.  Stay current with your immunizations.  Do not use any tobacco products including cigarettes, chewing tobacco, or electronic cigarettes.  If you are pregnant, do not drink alcohol.  If you are breastfeeding, limit how much and how often you drink alcohol.  Limit alcohol intake to no more than 1 drink per day for nonpregnant women. One drink equals 12 ounces of beer, 5 ounces of wine, or 1 ounces of hard liquor.  Do not use street drugs.  Do not share needles.  Ask your health care provider for help if you need support or information about quitting drugs.  Tell your health care provider if you often feel depressed.  Tell your health care provider if you have ever been abused or do not feel safe at home. This information is not intended to replace advice given to you by your health care provider. Make sure you discuss any questions you have with your health care provider. Document Released: 12/27/2010 Document Revised: 11/19/2015 Document Reviewed: 03/17/2015 Elsevier Interactive Patient Education  [employer].

## 2017-09-22 LAB — HEPATITIS C ANTIBODY
Hepatitis C Ab: NONREACTIVE
SIGNAL TO CUT-OFF: 0.04 (ref <1.00)

## 2017-09-22 LAB — HIV ANTIBODY (ROUTINE TESTING W REFLEX): HIV: NONREACTIVE

## 2017-09-23 ENCOUNTER — Encounter: Payer: Self-pay | Admitting: Family Medicine

## 2017-09-25 ENCOUNTER — Encounter (INDEPENDENT_AMBULATORY_CARE_PROVIDER_SITE_OTHER): Payer: Self-pay | Admitting: Family Medicine

## 2017-09-25 ENCOUNTER — Ambulatory Visit (INDEPENDENT_AMBULATORY_CARE_PROVIDER_SITE_OTHER): Payer: BC Managed Care – PPO | Admitting: Family Medicine

## 2017-09-25 VITALS — Temp 97.9°F | Ht 62.0 in | Wt 203.0 lb

## 2017-09-25 DIAGNOSIS — Z6837 Body mass index (BMI) 37.0-37.9, adult: Secondary | ICD-10-CM

## 2017-09-25 DIAGNOSIS — Z9884 Bariatric surgery status: Secondary | ICD-10-CM | POA: Insufficient documentation

## 2017-09-25 DIAGNOSIS — R0602 Shortness of breath: Secondary | ICD-10-CM | POA: Insufficient documentation

## 2017-09-25 DIAGNOSIS — Z9189 Other specified personal risk factors, not elsewhere classified: Secondary | ICD-10-CM

## 2017-09-25 DIAGNOSIS — Z0289 Encounter for other administrative examinations: Secondary | ICD-10-CM

## 2017-09-25 DIAGNOSIS — R5383 Other fatigue: Secondary | ICD-10-CM | POA: Insufficient documentation

## 2017-09-25 DIAGNOSIS — R7303 Prediabetes: Secondary | ICD-10-CM | POA: Insufficient documentation

## 2017-09-25 DIAGNOSIS — Z1331 Encounter for screening for depression: Secondary | ICD-10-CM | POA: Diagnosis not present

## 2017-09-25 HISTORY — DX: Shortness of breath: R06.02

## 2017-09-25 HISTORY — DX: Other fatigue: R53.83

## 2017-09-25 HISTORY — DX: Bariatric surgery status: Z98.84

## 2017-09-25 NOTE — Progress Notes (Signed)
Office: [phone]  /  Fax: [phone]   Dear Dr. Redmond Pulling,   Thank you for referring [name] to our clinic. The following note includes my evaluation and treatment recommendations.  HPI:   Chief Complaint: OBESITY    Misty Bell has been referred by Greer Pickerel, MD for consultation regarding her obesity and obesity related comorbidities.    Misty Bell (MR# [id number]) is a 62 y.o. female who presents on 09/25/2017 for obesity evaluation and treatment. Current BMI is Body mass index is 37.13 kg/m.Misty Bell has been struggling with her weight for many years and has been unsuccessful in either losing weight, maintaining weight loss, or reaching her healthy weight goal.     Misty Bell is status post gastric sleeve and went from 260 lbs to 189 lbs, now 203 lbs. Her goal is 175 lbs.     Misty Bell attended our information session and states she is currently in the action stage of change and ready to dedicate time achieving and maintaining a healthier weight. Misty Bell is interested in becoming our patient and working on intensive lifestyle modifications including (but not limited to) diet, exercise and weight loss.    [name] states her desired weight loss is 28 lbs she started gaining weight after 3rd child her heaviest weight ever was 260 lbs she has significant food cravings issues  she snacks frequently in the evenings she wakes up frquently in the middle of the night to eat she skips meals frequently she is frequently drinking liquids with calories she frequently makes poor food choices she frequently eats larger portions than normal  she struggles with emotional eating    Misty Bell feels her energy is lower than it should be. This has worsened with weight gain and has not worsened recently. Misty Bell admits to daytime somnolence and  admits to waking up still tired. Patient is at risk for obstructive sleep apnea. Patent has a history of symptoms of daytime  Misty. Patient generally gets 4 hours of sleep per night, and states they generally have nightime awakenings. Snoring is present. Apneic episodes are not present. Epworth Sleepiness Score is 7.  Dyspnea on exertion Misty Bell notes increasing shortness of breath with exercising and seems to be worsening over time with weight gain. She notes getting out of breath sooner with activity than she used to. This has not gotten worse recently. [name] denies orthopnea.  Pre-Diabetes Misty Bell has a diagnosis of pre-diabetes based on her elevated Hgb A1c and was informed this puts her at greater risk of developing diabetes. She had a recent A1c of 6.3, she notes polyphagia and she is not on metformin currently and continues to work on diet and exercise to decrease risk of diabetes. She denies nausea or hypoglycemia.  At risk for diabetes Misty Bell is at higher than average risk for developing diabetes due to her obesity and pre-diabetes. She currently denies polyuria or polydipsia.  Depression Screen Misty Bell's Food and Mood (modified PHQ-9) score was  Depression screen PHQ 2/9 09/25/2017  Decreased Interest 2  Bell, Depressed, Hopeless 3  PHQ - 2 Score 5  Altered sleeping 1  Tired, decreased energy 3  Change in appetite 2  Feeling bad or failure about yourself  1  Trouble concentrating 2  Moving slowly or fidgety/restless 1  Suicidal thoughts 0  PHQ-9 Score 15  Difficult doing work/chores Not difficult at all    ALLERGIES: No Known Allergies  MEDICATIONS: Current Outpatient Medications on File Prior to Visit  Medication Sig Dispense  Refill  . amLODipine (NORVASC) 10 MG tablet Take 1 tablet (10 mg total) by mouth daily. 90 tablet 1  . calcium citrate-vitamin D (CITRACAL+D) 315-200 MG-UNIT tablet Take 1 tablet by mouth 2 (two) times daily.    . cyanocobalamin 500 MCG tablet Take 500 mcg by mouth daily.    . furosemide (LASIX) 20 MG tablet Take 1 tablet (20 mg total) by mouth daily. 90 tablet 3  .  Multiple Vitamins-Minerals (CELEBRATE MULTI-COMPLETE 18) CHEW Chew by mouth daily.    . traZODone (DESYREL) 50 MG tablet Take 0.5-1 tablets (25-50 mg total) by mouth at bedtime as needed for sleep. 30 tablet 9   No current facility-administered medications on file prior to visit.     PAST MEDICAL HISTORY: Past Medical History:  Diagnosis Date  . Anxiety   . Anxiety disorder   . Back pain   . Diabetes mellitus type 2 in obese ([place])   . Hypertension 2010  . Knee pain   . Migraine   . Primary osteoarthritis of both knees   . Snoring   . Varicose veins     PAST SURGICAL HISTORY: Past Surgical History:  Procedure Laterality Date  . COLONOSCOPY    . DILATION AND CURETTAGE OF UTERUS     years ago-several  . FRACTURE SURGERY     neck  . HIATAL HERNIA REPAIR  09/14/2015   Procedure: LAPAROSCOPIC REPAIR OF HIATAL HERNIA;  Surgeon: Greer Pickerel, MD;  Location: [facility];  Service: General;;  . LAPAROSCOPIC GASTRIC SLEEVE RESECTION N/A 09/14/2015   Procedure: LAPAROSCOPIC GASTRIC SLEEVE RESECTION W/UPPER ENDO;  Surgeon: Greer Pickerel, MD;  Location: [facility];  Service: General;  Laterality: N/A;  . uterine ablation  08/2010    SOCIAL HISTORY: Social History   Tobacco Use  . Smoking status: Former Smoker    Types: Cigarettes    Last attempt to quit: 07/27/2015    Years since quitting: 2.1  . Smokeless tobacco: Never Used  Substance Use Topics  . Alcohol use: Yes    Comment: occassionally  . Drug use: No    FAMILY HISTORY: Family History  Problem Relation Age of Onset  . Heart disease Mother   . Cervical cancer Mother   . High blood pressure Mother   . High Cholesterol Mother   . Sudden death Mother   . Diabetes Other        [place]  . Heart disease Other        female >69, [facility]  . Colon cancer Neg Hx     ROS: Review of Systems  Constitutional: Positive for malaise/Misty. Negative for weight loss.       + Trouble sleeping + Breasts lumps  HENT: Positive for sinus pain.         + Dentures  Eyes:       + Wear glasses or contacts  Respiratory: Positive for shortness of breath (with exertion).   Cardiovascular: Negative for orthopnea.       + Leg cramping  Gastrointestinal: Negative for nausea.  Genitourinary: Negative for frequency.  Musculoskeletal: Positive for back pain and neck pain.       + Neck stiffness + Muscle or joint pain + Muscle stiffness  Neurological: Positive for headaches (occasionally).  Endo/Heme/Allergies: Negative for polydipsia. Bruises/bleeds easily.       Positive polyphagia Negative hypoglycemia  Psychiatric/Behavioral: Positive for depression. Negative for suicidal ideas.       + Stress    PHYSICAL EXAM: Temperature 97.9 F (  36.6 C), temperature source Oral, height 5\' 2"  (1.575 m), weight 203 lb (92.1 kg). Body mass index is 37.13 kg/m. Physical Exam  Constitutional: She is oriented to person, place, and time. She appears well-developed and well-nourished.  HENT:  Head: Normocephalic and atraumatic.  Nose: Nose normal.  Eyes: EOM are normal. No scleral icterus.  Neck: Normal range of motion. Neck supple. No thyromegaly present.  Cardiovascular: Normal rate and regular rhythm.  Pulmonary/Chest: Effort normal. No respiratory distress.  Abdominal: Soft. There is no tenderness.  + Obesity  Musculoskeletal:  Range of Motion normal in all 4 extremities Trace edema noted in bilateral lower extremities  Neurological: She is alert and oriented to person, place, and time. Coordination normal.  Skin: Skin is warm and dry.  Psychiatric: She has a normal mood and affect. Her behavior is normal.  Vitals reviewed.   RECENT LABS AND TESTS: BMET    Component Value Date/Time   NA 139 08/24/2017 1521   K 3.9 08/24/2017 1521   CL 103 08/24/2017 1521   CO2 29 08/24/2017 1521   GLUCOSE 101 (H) 08/24/2017 1521   BUN 16 08/24/2017 1521   CREATININE 0.60 08/24/2017 1521   CREATININE 0.70 09/02/2015 0948   CALCIUM 9.6 08/24/2017  1521   GFRNONAA >60 09/15/2015 0504   GFRAA >60 09/15/2015 0504   Lab Results  Component Value Date   HGBA1C 6.3 09/21/2017   No results found for: INSULIN CBC    Component Value Date/Time   WBC 9.7 09/21/2017 1019   RBC 4.80 09/21/2017 1019   HGB 14.5 09/21/2017 1019   HCT 43.2 09/21/2017 1019   PLT 360.0 09/21/2017 1019   MCV 89.9 09/21/2017 1019   MCH 28.9 09/16/2015 0429   MCHC 33.5 09/21/2017 1019   RDW 14.5 09/21/2017 1019   LYMPHSABS 2.9 09/16/2015 0429   MONOABS 0.8 09/16/2015 0429   EOSABS 0.2 09/16/2015 0429   BASOSABS 0.0 09/16/2015 0429   Iron/TIBC/Ferritin/ %Sat No results found for: IRON, TIBC, FERRITIN, IRONPCTSAT Lipid Panel     Component Value Date/Time   CHOL 245 (H) 09/21/2017 1019   TRIG 107.0 09/21/2017 1019   HDL 66.80 09/21/2017 1019   CHOLHDL 4 09/21/2017 1019   VLDL 21.4 09/21/2017 1019   LDLCALC 157 (H) 09/21/2017 1019   Hepatic Function Panel     Component Value Date/Time   PROT 7.3 08/24/2017 1521   ALBUMIN 3.5 08/24/2017 1521   AST 14 08/24/2017 1521   ALT 12 08/24/2017 1521   ALKPHOS 81 08/24/2017 1521   BILITOT 0.2 08/24/2017 1521      Component Value Date/Time   TSH 0.97 08/24/2017 1521    ECG  shows NSR with a rate of 91 BPM INDIRECT CALORIMETER done today shows a VO2 of 294 and a REE of 2047.  Her calculated basal metabolic rate is 2671 thus her basal metabolic rate is better than expected.    ASSESSMENT AND PLAN: Other Misty - Plan: EKG 12-Lead, VITAMIN D 25 Hydroxy (Vit-D Deficiency, Fractures), Vitamin B12, Folate, T3, T4, free, TSH  Shortness of breath on exertion  Prediabetes - Plan: Comprehensive metabolic panel, Insulin, random  Status post gastric banding  Depression screening  At risk for diabetes mellitus  Class 2 severe obesity with serious comorbidity and body mass index (BMI) of 37.0 to 37.9 in adult, unspecified obesity type ([place])  PLAN:  Misty Latorie was informed that her Misty may be  related to obesity, depression or many other causes. Labs will be  ordered, and in the meanwhile Thessaly has agreed to work on diet, exercise and weight loss to help with Misty. Proper sleep hygiene was discussed including the need for 7-8 hours of quality sleep each night. A sleep study was not ordered based on symptoms and Epworth score.  Dyspnea on exertion Mellonie's shortness of breath appears to be obesity related and exercise induced. She has agreed to work on weight loss and gradually increase exercise to treat her exercise induced shortness of breath. If Lyanna follows our instructions and loses weight without improvement of her shortness of breath, we will plan to refer to pulmonology. We will monitor this condition regularly. Shirley agrees to this plan.  Pre-Diabetes Kamron will continue to work on weight loss, diet, exercise, and decreasing simple carbohydrates in her diet to help decrease the risk of diabetes. We dicussed metformin including benefits and risks. She was informed that eating too many simple carbohydrates or too many calories at one sitting increases the likelihood of GI side effects. Adalena declined metformin for now and a prescription was not written today. We will check insulin and Deirdre agrees to follow up with our clinic in 2 weeks as directed to monitor her progress.  Diabetes risk counselling Chakita was given extended (15 minutes) diabetes prevention counseling today. She is 62 y.o. female and has risk factors for diabetes including obesity and pre-diabetes. We discussed intensive lifestyle modifications today with an emphasis on weight loss as well as increasing exercise and decreasing simple carbohydrates in her diet.  Depression Screen Lillis had a moderately positive depression screening. Depression is commonly associated with obesity and often results in emotional eating behaviors. We will monitor this closely and work on CBT to help improve the non-hunger eating  patterns. Referral to Psychology may be required if no improvement is seen as she continues in our clinic.  Obesity Tejah is currently in the action stage of change and her goal is to continue with weight loss efforts. I recommend Latrece begin the structured treatment plan as follows:  She has agreed to follow the Category 3 plan Juliya has been instructed to eventually work up to a goal of 150 minutes of combined cardio and strengthening exercise per week for weight loss and overall health benefits. We discussed the following Behavioral Modification Strategies today: increasing lean protein intake, decreasing simple carbohydrates  and work on meal planning and easy cooking plans   She was informed of the importance of frequent follow up visits to maximize her success with intensive lifestyle modifications for her multiple health conditions. She was informed we would discuss her lab results at her next visit unless there is a critical issue that needs to be addressed sooner. Jahnyla agreed to keep her next visit at the agreed upon time to discuss these results.    OBESITY BEHAVIORAL INTERVENTION VISIT  Today's visit was # 1 out of 22.  Starting weight: 203 lbs Starting date: 09/25/17 Today's weight : 203 lbs  Today's date: 09/25/2017 Total lbs lost to date: 0 (Patients must lose 7 lbs in the first 6 months to continue with counseling)   ASK: We discussed the diagnosis of obesity with Ellouise Newer today and Jaline agreed to give [place] permission to discuss obesity behavioral modification therapy today.  ASSESS: Deshauna has the diagnosis of obesity and her BMI today is 37.12 Lakeisa is in the action stage of change   ADVISE: Phylis was educated on the multiple health risks of obesity as well as the benefit  of weight loss to improve her health. She was advised of the need for long term treatment and the importance of lifestyle modifications.  AGREE: Multiple dietary modification  options and treatment options were discussed and  Anesha agreed to the above obesity treatment plan.   I, Trixie Dredge, am acting as transcriptionist for Dennard Nip, MD  I have reviewed the above documentation for accuracy and completeness, and I agree with the above. -Dennard Nip, MD

## 2017-09-26 LAB — COMPREHENSIVE METABOLIC PANEL
A/G RATIO: 1.3 (ref 1.2–2.2)
ALBUMIN: 4.2 g/dL (ref 3.5–5.5)
ALK PHOS: 98 IU/L (ref 39–117)
ALT: 13 IU/L (ref 0–32)
AST: 15 IU/L (ref 0–40)
BILIRUBIN TOTAL: 0.4 mg/dL (ref 0.0–1.2)
BUN / CREAT RATIO: 13 (ref 9–23)
BUN: 9 mg/dL (ref 6–24)
CHLORIDE: 100 mmol/L (ref 96–106)
CO2: 26 mmol/L (ref 20–29)
Calcium: 9.6 mg/dL (ref 8.7–10.2)
Creatinine, Ser: 0.71 mg/dL (ref 0.57–1.00)
GFR calc Af Amer: 108 mL/min/{1.73_m2} (ref >59)
GFR calc non Af Amer: 94 mL/min/{1.73_m2} (ref >59)
GLOBULIN, TOTAL: 3.2 g/dL (ref 1.5–4.5)
Glucose: 114 mg/dL — ABNORMAL HIGH (ref 65–99)
POTASSIUM: 4 mmol/L (ref 3.5–5.2)
SODIUM: 143 mmol/L (ref 134–144)
TOTAL PROTEIN: 7.4 g/dL (ref 6.0–8.5)

## 2017-09-26 LAB — VITAMIN B12: Vitamin B-12: >2000 pg/mL — ABNORMAL HIGH (ref 232–1245)

## 2017-09-26 LAB — TSH: TSH: 0.963 u[IU]/mL (ref 0.450–4.500)

## 2017-09-26 LAB — FOLATE

## 2017-09-26 LAB — T4, FREE: Free T4: 1.18 ng/dL (ref 0.82–1.77)

## 2017-09-26 LAB — T3: T3, Total: 110 ng/dL (ref 71–180)

## 2017-09-26 LAB — INSULIN, RANDOM: INSULIN: 11.9 u[IU]/mL (ref 2.6–24.9)

## 2017-09-26 LAB — VITAMIN D 25 HYDROXY (VIT D DEFICIENCY, FRACTURES): Vit D, 25-Hydroxy: 30.2 ng/mL (ref 30.0–100.0)

## 2017-10-07 ENCOUNTER — Other Ambulatory Visit: Payer: Self-pay | Admitting: Family Medicine

## 2017-10-09 ENCOUNTER — Ambulatory Visit (INDEPENDENT_AMBULATORY_CARE_PROVIDER_SITE_OTHER): Payer: BC Managed Care – PPO | Admitting: Family Medicine

## 2017-10-09 VITALS — BP 119/80 | HR 115 | Temp 98.3°F | Ht 62.0 in | Wt 199.0 lb

## 2017-10-09 DIAGNOSIS — R7303 Prediabetes: Secondary | ICD-10-CM

## 2017-10-09 DIAGNOSIS — Z6836 Body mass index (BMI) 36.0-36.9, adult: Secondary | ICD-10-CM

## 2017-10-09 DIAGNOSIS — E559 Vitamin D deficiency, unspecified: Secondary | ICD-10-CM | POA: Diagnosis not present

## 2017-10-09 DIAGNOSIS — Z9189 Other specified personal risk factors, not elsewhere classified: Secondary | ICD-10-CM

## 2017-10-09 MED ORDER — VITAMIN D (ERGOCALCIFEROL) 1.25 MG (50000 UNIT) PO CAPS: 50000.0000 [IU] | ORAL_CAPSULE | ORAL | 0 refills | Status: DC

## 2017-10-09 MED ORDER — METFORMIN HCL 500 MG PO TABS: 500.0000 mg | ORAL_TABLET | Freq: Every day | ORAL | 0 refills | Status: DC

## 2017-10-10 NOTE — Progress Notes (Signed)
Office: [phone]  /  Fax: [phone]   HPI:   Chief Complaint: OBESITY Misty Bell is here to discuss her progress with her obesity treatment plan. She is on the Category 3 plan and is following her eating plan approximately 80 % of the time. She states she is exercising 0 minutes 0 times per week. Misty Bell did well with weight loss but struggled to eat all of her dinner. She noted some temptations at dinner and didn't like eating greek yogurt. She is status post weight loss surgery.  Her weight is 199 lb (90.3 kg) today and has had a weight loss of 4 pounds over a period of 2 weeks since her last visit. She has lost 4 lbs since starting treatment with [place].  Pre-Diabetes Misty Bell has a diagnosis of pre-diabetes based on her elevated Hgb A1c, fasting glucose, and fasting insulin and was informed this puts her at greater risk of developing diabetes. Polyphagia improved in the last 2 weeks on Category 3 plan. She is not taking metformin currently and continues to work on diet and exercise to decrease risk of diabetes. She denies nausea or hypoglycemia.  At risk for diabetes Misty Bell is at higher than average risk for developing diabetes due to her obesity and pre-diabetes. She currently denies polyuria or polydipsia.  Vitamin D Deficiency Misty Bell has a diagnosis of vitamin D deficiency. She is on OTC multivitamin, not yet at goal. She denies nausea, vomiting or muscle weakness.  ALLERGIES: No Known Allergies  MEDICATIONS: Current Outpatient Medications on File Prior to Visit  Medication Sig Dispense Refill  . amLODipine (NORVASC) 10 MG tablet Take 1 tablet (10 mg total) by mouth daily. 90 tablet 1  . calcium citrate-vitamin D (CITRACAL+D) 315-200 MG-UNIT tablet Take 1 tablet by mouth 2 (two) times daily.    . cyanocobalamin 500 MCG tablet Take 500 mcg by mouth daily.    . furosemide (LASIX) 20 MG tablet Take 1 tablet (20 mg total) by mouth daily. 90 tablet 3  . Multiple Vitamins-Minerals  (CELEBRATE MULTI-COMPLETE 18) CHEW Chew by mouth daily.    . traZODone (DESYREL) 50 MG tablet Take 0.5-1 tablets (25-50 mg total) by mouth at bedtime as needed for sleep. 30 tablet 9   No current facility-administered medications on file prior to visit.     PAST MEDICAL HISTORY: Past Medical History:  Diagnosis Date  . Anxiety   . Anxiety disorder   . Back pain   . Diabetes mellitus type 2 in obese ([place])   . Hypertension 2010  . Knee pain   . Migraine   . Primary osteoarthritis of both knees   . Snoring   . Varicose veins     PAST SURGICAL HISTORY: Past Surgical History:  Procedure Laterality Date  . COLONOSCOPY    . DILATION AND CURETTAGE OF UTERUS     years ago-several  . FRACTURE SURGERY     neck  . HIATAL HERNIA REPAIR  09/14/2015   Procedure: LAPAROSCOPIC REPAIR OF HIATAL HERNIA;  Surgeon: Greer Pickerel, MD;  Location: [facility];  Service: General;;  . LAPAROSCOPIC GASTRIC SLEEVE RESECTION N/A 09/14/2015   Procedure: LAPAROSCOPIC GASTRIC SLEEVE RESECTION W/UPPER ENDO;  Surgeon: Greer Pickerel, MD;  Location: [facility];  Service: General;  Laterality: N/A;  . uterine ablation  08/2010    SOCIAL HISTORY: Social History   Tobacco Use  . Smoking status: Former Smoker    Types: Cigarettes    Last attempt to quit: 07/27/2015    Years since quitting:  2.2  . Smokeless tobacco: Never Used  Substance Use Topics  . Alcohol use: Yes    Comment: occassionally  . Drug use: No    FAMILY HISTORY: Family History  Problem Relation Age of Onset  . Heart disease Mother   . Cervical cancer Mother   . High blood pressure Mother   . High Cholesterol Mother   . Sudden death Mother   . Diabetes Other        Misty Bell  . Heart disease Other        female >85, [facility]  . Colon cancer Neg Hx     ROS: Review of Systems  Constitutional: Positive for weight loss.  Gastrointestinal: Negative for nausea and vomiting.  Genitourinary: Negative for frequency.  Musculoskeletal:       Negative muscle  weakness  Endo/Heme/Allergies: Negative for polydipsia.       Positive polyphagia Negative hypoglycemia    PHYSICAL EXAM: Blood pressure 119/80, pulse (!) 115, temperature 98.3 F (36.8 C), temperature source Oral, height 5\' 2"  (1.575 m), weight 199 lb (90.3 kg), SpO2 96 %. Body mass index is 36.4 kg/m. Physical Exam  Constitutional: She is oriented to person, place, and time. She appears well-developed and well-nourished.  Cardiovascular: Normal rate.  Pulmonary/Chest: Effort normal.  Musculoskeletal: Normal range of motion.  Neurological: She is oriented to person, place, and time.  Skin: Skin is warm and dry.  Psychiatric: She has a normal mood and affect. Her behavior is normal.  Vitals reviewed.   RECENT LABS AND TESTS: BMET    Component Value Date/Time   NA 143 09/25/2017 0944   K 4.0 09/25/2017 0944   CL 100 09/25/2017 0944   CO2 26 09/25/2017 0944   GLUCOSE 114 (H) 09/25/2017 0944   GLUCOSE 101 (H) 08/24/2017 1521   BUN 9 09/25/2017 0944   CREATININE 0.71 09/25/2017 0944   CREATININE 0.70 09/02/2015 0948   CALCIUM 9.6 09/25/2017 0944   GFRNONAA 94 09/25/2017 0944   GFRAA 108 09/25/2017 0944   Lab Results  Component Value Date   HGBA1C 6.3 09/21/2017   HGBA1C 6.0 (H) 08/23/2016   HGBA1C 7.0 (H) 09/07/2015   Lab Results  Component Value Date   INSULIN 11.9 09/25/2017   CBC    Component Value Date/Time   WBC 9.7 09/21/2017 1019   RBC 4.80 09/21/2017 1019   HGB 14.5 09/21/2017 1019   HCT 43.2 09/21/2017 1019   PLT 360.0 09/21/2017 1019   MCV 89.9 09/21/2017 1019   MCH 28.9 09/16/2015 0429   MCHC 33.5 09/21/2017 1019   RDW 14.5 09/21/2017 1019   LYMPHSABS 2.9 09/16/2015 0429   MONOABS 0.8 09/16/2015 0429   EOSABS 0.2 09/16/2015 0429   BASOSABS 0.0 09/16/2015 0429   Iron/TIBC/Ferritin/ %Sat No results found for: IRON, TIBC, FERRITIN, IRONPCTSAT Lipid Panel     Component Value Date/Time   CHOL 245 (H) 09/21/2017 1019   TRIG 107.0 09/21/2017  1019   HDL 66.80 09/21/2017 1019   CHOLHDL 4 09/21/2017 1019   VLDL 21.4 09/21/2017 1019   LDLCALC 157 (H) 09/21/2017 1019   Hepatic Function Panel     Component Value Date/Time   PROT 7.4 09/25/2017 0944   ALBUMIN 4.2 09/25/2017 0944   AST 15 09/25/2017 0944   ALT 13 09/25/2017 0944   ALKPHOS 98 09/25/2017 0944   BILITOT 0.4 09/25/2017 0944      Component Value Date/Time   TSH 0.963 09/25/2017 0944   TSH 0.97 08/24/2017 1521  Results  for Misty Bell, Misty Bell (MRN [id number]) as of 10/10/2017 08:48  Ref. Range 09/25/2017 09:44  Vitamin D, 25-Hydroxy Latest Ref Range: 30.0 - 100.0 ng/mL 30.2    ASSESSMENT AND PLAN: Prediabetes - Plan: metFORMIN (GLUCOPHAGE) 500 MG tablet  Vitamin D deficiency - Plan: Vitamin D, Ergocalciferol, (DRISDOL) 50000 units CAPS capsule  At risk for diabetes mellitus  Class 2 severe obesity with serious comorbidity and body mass index (BMI) of 36.0 to 36.9 in adult, unspecified obesity type [facility])  PLAN:  Pre-Diabetes Misty Bell will continue to work on weight loss, diet, exercise, and decreasing simple carbohydrates in her diet to help decrease the risk of diabetes. We dicussed metformin including benefits and risks. She was informed that eating too many simple carbohydrates or too many calories at one sitting increases the likelihood of GI side effects. Misty Bell agrees to start metformin 500 mg q AM #30 with no refills. Misty Bell agrees to follow up with our clinic in 2 to 3 weeks as directed to monitor her progress.  Diabetes risk counselling Tawonna was given extended (30 minutes) diabetes prevention counseling today. She is 62 y.o. female and has risk factors for diabetes including obesity and pre-diabetes. We discussed intensive lifestyle modifications today with an emphasis on weight loss as well as increasing exercise and decreasing simple carbohydrates in her diet.  Vitamin D Deficiency Misty Bell was informed that low vitamin D levels contributes to fatigue  and are associated with obesity, breast, and colon cancer. Misty Bell agrees to start prescription Vit D @50 ,000 IU every week #4 with no refills. She will follow up for routine testing of vitamin D, at least 2-3 times per year. She was informed of the risk of over-replacement of vitamin D and agrees to not increase her dose unless she discusses this with [place] first. Misty Bell agrees to follow up with our clinic in 2 to 3 weeks.  Obesity Misty Bell is currently in the action stage of change. As such, her goal is to continue with weight loss efforts She has agreed to follow the Category 3 plan Misty Bell has been instructed to work up to a goal of 150 minutes of combined cardio and strengthening exercise per week for weight loss and overall health benefits. We discussed the following Behavioral Modification Strategies today: increasing lean protein intake, decreasing simple carbohydrates, and no skipping meals    Misty Bell has agreed to follow up with our clinic in 2 to 3 weeks. She was informed of the importance of frequent follow up visits to maximize her success with intensive lifestyle modifications for her multiple health conditions.   OBESITY BEHAVIORAL INTERVENTION VISIT  Today's visit was # 2 out of 22.  Starting weight: 203 lbs Starting date: 09/25/17 Today's weight : 199 lbs Today's date: 10/09/2017 Total lbs lost to date: 4 (Patients must lose 7 lbs in the first 6 months to continue with counseling)   ASK: We discussed the diagnosis of obesity with Misty Bell today and Misty Bell agreed to give [place] permission to discuss obesity behavioral modification therapy today.  ASSESS: Misty Bell has the diagnosis of obesity and her BMI today is 36.39 Misty Bell is in the action stage of change   ADVISE: Misty Bell was educated on the multiple health risks of obesity as well as the benefit of weight loss to improve her health. She was advised of the need for long term treatment and the importance of lifestyle  modifications.  AGREE: Multiple dietary modification options and treatment options were discussed and  Misty Bell agreed to the  above obesity treatment plan.  I, Trixie Dredge, am acting as transcriptionist for Dennard Nip, MD  I have reviewed the above documentation for accuracy and completeness, and I agree with the above. -Dennard Nip, MD

## 2017-10-12 ENCOUNTER — Encounter: Payer: Self-pay | Admitting: Obstetrics & Gynecology

## 2017-10-12 ENCOUNTER — Ambulatory Visit (INDEPENDENT_AMBULATORY_CARE_PROVIDER_SITE_OTHER): Payer: BC Managed Care – PPO | Admitting: Obstetrics & Gynecology

## 2017-10-12 VITALS — BP 107/64 | HR 101 | Ht 62.0 in | Wt 201.0 lb

## 2017-10-12 DIAGNOSIS — R933 Abnormal findings on diagnostic imaging of other parts of digestive tract: Secondary | ICD-10-CM

## 2017-10-12 DIAGNOSIS — Z01419 Encounter for gynecological examination (general) (routine) without abnormal findings: Secondary | ICD-10-CM | POA: Diagnosis not present

## 2017-10-12 NOTE — Progress Notes (Signed)
Subjective:     Misty Bell is a 62 y.o. female here for a routine exam.  Current complaints: no GYN complaints.  Pt had loss of 3rd sibling in 2 years.   She feels 'sad' but does not feel that she is depressed. She was recently started on Trazadone for poor sleep pattern.    Gynecologic History No LMP recorded. Patient has had an ablation. Contraception: post menopausal status Last Pap: 09/19/2016. Results were: normal Last mammogram: 09/21/2017. Results were: normal  Obstetric History OB History  Gravida Para Term Preterm AB Living  4 3          SAB TAB Ectopic Multiple Live Births          3    # Outcome Date GA Lbr Len/2nd Weight Sex Delivery Anes PTL Lv  4 Gravida           3 Para      Vag-Spont     2 Para      Vag-Spont     1 Para      Vag-Spont       The following portions of the patient's history were reviewed and updated as appropriate: allergies, current medications, past family history, past medical history, past social history, past surgical history and problem list.  Review of Systems Pertinent items are noted in HPI.    Objective:  BP 107/64 (BP Location: Left Arm)   Pulse (!) 101   Ht 5\' 2"  (1.575 m)   Wt 201 lb (91.2 kg)   BMI 36.76 kg/m  General Appearance:    Alert, cooperative, no distress, appears stated age  Head:    Normocephalic, without obvious abnormality, atraumatic  Eyes:    conjunctiva/corneas clear, EOM's intact, both eyes  Ears:    Normal external ear canals, both ears  Nose:   Nares normal, septum midline, mucosa normal, no drainage    or sinus tenderness  Throat:   Lips, mucosa, and tongue normal; teeth and gums normal  Neck:   Supple, symmetrical, trachea midline, no adenopathy;    thyroid:  no enlargement/tenderness/nodules  Back:     Symmetric, no curvature, ROM normal, no CVA tenderness  Lungs:     Clear to auscultation bilaterally, respirations unlabored  Chest Wall:    No tenderness or deformity   Heart:    Regular rate and  rhythm, S1 and S2 normal, no murmur, rub   or gallop  Breast Exam:    No tenderness, masses, or nipple abnormality  Abdomen:     Soft, non-tender, bowel sounds active all four quadrants,    no masses, no organomegaly  Genitalia:    Normal female without lesion, discharge or tenderness     Extremities:   Extremities normal, atraumatic, no cyanosis or edema  Pulses:   2+ and symmetric all extremities  Skin:   Skin color, texture, turgor normal, no rashes or lesions     Assessment:    Healthy female exam.   Multiple family deaths - normal grief reaction PAP not indicated this year. Next due 2021   Plan:    Follow up in: 1 year.    F/u sooner prn Need repeat colonoscopy referal to GI Coconut oil to vulva prn  Tymeka Privette L. Harraway-Smith, M.D., [name]

## 2017-10-30 ENCOUNTER — Ambulatory Visit (INDEPENDENT_AMBULATORY_CARE_PROVIDER_SITE_OTHER): Payer: BC Managed Care – PPO | Admitting: Family Medicine

## 2017-10-30 VITALS — BP 124/78 | HR 96 | Temp 97.8°F | Ht 62.0 in | Wt 199.0 lb

## 2017-10-30 DIAGNOSIS — Z6836 Body mass index (BMI) 36.0-36.9, adult: Secondary | ICD-10-CM

## 2017-10-30 DIAGNOSIS — R7303 Prediabetes: Secondary | ICD-10-CM

## 2017-10-30 DIAGNOSIS — E559 Vitamin D deficiency, unspecified: Secondary | ICD-10-CM

## 2017-10-30 DIAGNOSIS — Z9189 Other specified personal risk factors, not elsewhere classified: Secondary | ICD-10-CM | POA: Diagnosis not present

## 2017-10-30 MED ORDER — VITAMIN D (ERGOCALCIFEROL) 1.25 MG (50000 UNIT) PO CAPS: 50000.0000 [IU] | ORAL_CAPSULE | ORAL | 0 refills | Status: DC

## 2017-10-31 NOTE — Progress Notes (Signed)
Office: [phone]  /  Fax: [phone]   HPI:   Chief Complaint: OBESITY Misty Bell is here to discuss her progress with her obesity treatment plan. She is on the Category 3 plan and is following her eating plan approximately 50 % of the time. She states she is exercising 0 minutes 0 times per week. [name] has lots of stressors in her personal life and ran out of food and had lots of late nights, so wasn't able to stay focused.  Her weight is 199 lb (90.3 kg) today and has not lost weight since her last visit. She has lost 4 lbs since starting treatment with [place].  Vitamin D Deficiency [name] has a diagnosis of vitamin D deficiency. She is currently taking prescription Vit D and denies nausea, vomiting or muscle weakness.  At risk for osteopenia and osteoporosis [name] is at higher risk of osteopenia and osteoporosis due to vitamin D deficiency.   Pre-Diabetes [name] has a diagnosis of pre-diabetes based on her elevated Hgb A1c and was informed this puts her at greater risk of developing diabetes. She has no GI side effects of metformin and continues to work on diet and exercise to decrease risk of diabetes. She denies nausea or hypoglycemia.  ALLERGIES: No Known Allergies  MEDICATIONS: Current Outpatient Medications on File Prior to Visit  Medication Sig Dispense Refill  . amLODipine (NORVASC) 10 MG tablet TAKE 1 TABLET BY MOUTH EVERY DAY 90 tablet 1  . calcium citrate-vitamin D (CITRACAL+D) 315-200 MG-UNIT tablet Take 1 tablet by mouth 2 (two) times daily.    . cyanocobalamin 500 MCG tablet Take 500 mcg by mouth daily.    . furosemide (LASIX) 20 MG tablet Take 1 tablet (20 mg total) by mouth daily. 90 tablet 3  . metFORMIN (GLUCOPHAGE) 500 MG tablet Take 1 tablet (500 mg total) by mouth daily with breakfast. 30 tablet 0  . Multiple Vitamins-Minerals (CELEBRATE MULTI-COMPLETE 18) CHEW Chew by mouth daily.    . traZODone (DESYREL) 50 MG tablet Take 0.5-1 tablets (25-50 mg total) by  mouth at bedtime as needed for sleep. 30 tablet 9   No current facility-administered medications on file prior to visit.     PAST MEDICAL HISTORY: Past Medical History:  Diagnosis Date  . Anxiety   . Anxiety disorder   . Back pain   . Diabetes mellitus type 2 in obese ([place])   . Hypertension 2010  . Knee pain   . Migraine   . Primary osteoarthritis of both knees   . Snoring   . Varicose veins     PAST SURGICAL HISTORY: Past Surgical History:  Procedure Laterality Date  . COLONOSCOPY    . DILATION AND CURETTAGE OF UTERUS     years ago-several  . FRACTURE SURGERY     neck  . HIATAL HERNIA REPAIR  09/14/2015   Procedure: LAPAROSCOPIC REPAIR OF HIATAL HERNIA;  Surgeon: Greer Pickerel, MD;  Location: [facility];  Service: General;;  . LAPAROSCOPIC GASTRIC SLEEVE RESECTION N/A 09/14/2015   Procedure: LAPAROSCOPIC GASTRIC SLEEVE RESECTION W/UPPER ENDO;  Surgeon: Greer Pickerel, MD;  Location: [facility];  Service: General;  Laterality: N/A;  . uterine ablation  08/2010    SOCIAL HISTORY: Social History   Tobacco Use  . Smoking status: Former Smoker    Types: Cigarettes    Last attempt to quit: 07/27/2015    Years since quitting: 2.2  . Smokeless tobacco: Never Used  Substance Use Topics  . Alcohol use: Yes  Comment: occassionally  . Drug use: No    FAMILY HISTORY: Family History  Problem Relation Age of Onset  . Heart disease Mother   . Cervical cancer Mother   . High blood pressure Mother   . High Cholesterol Mother   . Sudden death Mother   . Diabetes Other        [place]  . Heart disease Other        female >16, [facility]  . Heart disease Sister   . Colon cancer Neg Hx     ROS: Review of Systems  Constitutional: Negative for weight loss.  Gastrointestinal: Negative for nausea and vomiting.  Musculoskeletal:       Negative muscle weakness  Endo/Heme/Allergies:       Negative hypoglycemia    PHYSICAL EXAM: Blood pressure 124/78, pulse 96, temperature 97.8 F (36.6 C),  temperature source Oral, height 5\' 2"  (1.575 m), weight 199 lb (90.3 kg), SpO2 99 %. Body mass index is 36.4 kg/m. Physical Exam  Constitutional: She is oriented to person, place, and time. She appears well-developed and well-nourished.  Cardiovascular: Normal rate.  Pulmonary/Chest: Effort normal.  Musculoskeletal: Normal range of motion.  Neurological: She is oriented to person, place, and time.  Skin: Skin is warm and dry.  Psychiatric: She has a normal mood and affect. Her behavior is normal.  Vitals reviewed.   RECENT LABS AND TESTS: BMET    Component Value Date/Time   NA 143 09/25/2017 0944   K 4.0 09/25/2017 0944   CL 100 09/25/2017 0944   CO2 26 09/25/2017 0944   GLUCOSE 114 (H) 09/25/2017 0944   GLUCOSE 101 (H) 08/24/2017 1521   BUN 9 09/25/2017 0944   CREATININE 0.71 09/25/2017 0944   CREATININE 0.70 09/02/2015 0948   CALCIUM 9.6 09/25/2017 0944   GFRNONAA 94 09/25/2017 0944   GFRAA 108 09/25/2017 0944   Lab Results  Component Value Date   HGBA1C 6.3 09/21/2017   HGBA1C 6.0 (H) 08/23/2016   HGBA1C 7.0 (H) 09/07/2015   Lab Results  Component Value Date   INSULIN 11.9 09/25/2017   CBC    Component Value Date/Time   WBC 9.7 09/21/2017 1019   RBC 4.80 09/21/2017 1019   HGB 14.5 09/21/2017 1019   HCT 43.2 09/21/2017 1019   PLT 360.0 09/21/2017 1019   MCV 89.9 09/21/2017 1019   MCH 28.9 09/16/2015 0429   MCHC 33.5 09/21/2017 1019   RDW 14.5 09/21/2017 1019   LYMPHSABS 2.9 09/16/2015 0429   MONOABS 0.8 09/16/2015 0429   EOSABS 0.2 09/16/2015 0429   BASOSABS 0.0 09/16/2015 0429   Iron/TIBC/Ferritin/ %Sat No results found for: IRON, TIBC, FERRITIN, IRONPCTSAT Lipid Panel     Component Value Date/Time   CHOL 245 (H) 09/21/2017 1019   TRIG 107.0 09/21/2017 1019   HDL 66.80 09/21/2017 1019   CHOLHDL 4 09/21/2017 1019   VLDL 21.4 09/21/2017 1019   LDLCALC 157 (H) 09/21/2017 1019   Hepatic Function Panel     Component Value Date/Time   PROT 7.4  09/25/2017 0944   ALBUMIN 4.2 09/25/2017 0944   AST 15 09/25/2017 0944   ALT 13 09/25/2017 0944   ALKPHOS 98 09/25/2017 0944   BILITOT 0.4 09/25/2017 0944      Component Value Date/Time   TSH 0.963 09/25/2017 0944   TSH 0.97 08/24/2017 1521  Results for KRYSTL, WICKWARE (MRN [id number]) as of 10/31/2017 13:01  Ref. Range 09/25/2017 09:44  Vitamin D, 25-Hydroxy Latest Ref Range: 30.0 -  100.0 ng/mL 30.2    ASSESSMENT AND PLAN: Vitamin D deficiency - Plan: Vitamin D, Ergocalciferol, (DRISDOL) 50000 units CAPS capsule  Prediabetes  At risk for osteoporosis  Class 2 severe obesity with serious comorbidity and body mass index (BMI) of 36.0 to 36.9 in adult, unspecified obesity type ([place])  PLAN:  Vitamin D Deficiency Jovon was informed that low vitamin D levels contributes to fatigue and are associated with obesity, breast, and colon cancer. Aleenah agrees to continue taking prescription Vit D @50 ,000 IU every week #4 and we will refill for 1 month. She will follow up for routine testing of vitamin D, at least 2-3 times per year. She was informed of the risk of over-replacement of vitamin D and agrees to not increase her dose unless she discusses this with [place] first.  At risk for osteopenia and osteoporosis Norelle is at risk for osteopenia and osteoporsis due to her vitamin D deficiency. She was encouraged to take her vitamin D and follow her higher calcium diet and increase strengthening exercise to help strengthen her bones and decrease her risk of osteopenia and osteoporosis.  Pre-Diabetes Katena will continue to work on weight loss, exercise, and decreasing simple carbohydrates in her diet to help decrease the risk of diabetes. We dicussed metformin including benefits and risks. She was informed that eating too many simple carbohydrates or too many calories at one sitting increases the likelihood of GI side effects. Yarel agrees to continue taking metformin 500 mg PO daily. Rhapsody  agrees to follow up with our clinic in 2 weeks as directed to monitor her progress.  Obesity Aubre is currently in the action stage of change. As such, her goal is to continue with weight loss efforts She has agreed to follow the Category 3 plan Dezi has been instructed to work up to a goal of 150 minutes of combined cardio and strengthening exercise per week for weight loss and overall health benefits. We discussed the following Behavioral Modification Strategies today: increasing lean protein intake, work on meal planning and easy cooking plans, no skipping meals, and planning for success   Sanoe has agreed to follow up with our clinic in 2 weeks. She was informed of the importance of frequent follow up visits to maximize her success with intensive lifestyle modifications for her multiple health conditions.   OBESITY BEHAVIORAL INTERVENTION VISIT  Today's visit was # 3 out of 22.  Starting weight: 203 lbs Starting date: 09/25/17 Today's weight : 199 lbs  Today's date: 10/30/2017 Total lbs lost to date: 4 (Patients must lose 7 lbs in the first 6 months to continue with counseling)   ASK: We discussed the diagnosis of obesity with Ellouise Newer today and Arrietty agreed to give [place] permission to discuss obesity behavioral modification therapy today.  ASSESS: Diania has the diagnosis of obesity and her BMI today is 36.39 Sunshyne is in the action stage of change   ADVISE: Chiyo was educated on the multiple health risks of obesity as well as the benefit of weight loss to improve her health. She was advised of the need for long term treatment and the importance of lifestyle modifications.  AGREE: Multiple dietary modification options and treatment options were discussed and  Natalee agreed to the above obesity treatment plan.  I, Trixie Dredge, am acting as transcriptionist for Ilene Qua, MD  I have reviewed the above documentation for accuracy and completeness, and I  agree with the above. - Ilene Qua, MD

## 2017-11-02 ENCOUNTER — Telehealth: Payer: Self-pay

## 2017-11-02 MED ORDER — FLUCONAZOLE 150 MG PO TABS: 150.0000 mg | ORAL_TABLET | Freq: Once | ORAL | 1 refills | Status: AC

## 2017-11-02 NOTE — Telephone Encounter (Signed)
Patient states that she has been on antibiotic for three days. Patient states that she is have vaginal itching and burning and down towards anus.   Patient made aware that she is up to date on annual and will call in diflucan for her to take. If this does not resolve the itching she needs to be seen for evaluation. Patient states understanding. Kathrene Alu RN

## 2017-11-05 ENCOUNTER — Other Ambulatory Visit (INDEPENDENT_AMBULATORY_CARE_PROVIDER_SITE_OTHER): Payer: Self-pay | Admitting: Family Medicine

## 2017-11-05 DIAGNOSIS — R7303 Prediabetes: Secondary | ICD-10-CM

## 2017-11-14 ENCOUNTER — Ambulatory Visit (INDEPENDENT_AMBULATORY_CARE_PROVIDER_SITE_OTHER): Payer: BC Managed Care – PPO | Admitting: Family Medicine

## 2017-11-14 VITALS — BP 116/76 | HR 89 | Ht 62.0 in | Wt 199.0 lb

## 2017-11-14 DIAGNOSIS — Z6836 Body mass index (BMI) 36.0-36.9, adult: Secondary | ICD-10-CM | POA: Diagnosis not present

## 2017-11-14 DIAGNOSIS — R7303 Prediabetes: Secondary | ICD-10-CM

## 2017-11-15 NOTE — Progress Notes (Signed)
Office: [phone]  /  Fax: [phone]   HPI:   Chief Complaint: OBESITY Misty Bell is here to discuss her progress with her obesity treatment plan. She is on the Category 3 plan and is following her eating plan approximately 60 % of the time. She states she is doing the rower, elliptical and walking for 45 to 60 minutes 2 times per week. [name] notes increased work stress. [name] notes decreased meal planning, and is skipping meals. Her weight is 199 lb (90.3 kg) today and she has maintained weight over a period of 2 weeks since her last visit. She has lost 4 lbs since starting treatment with [place].  Pre-Diabetes Misty Bell has a diagnosis of prediabetes based on her elevated Hgb A1c and was informed this puts her at greater risk of developing diabetes. Syndey started metformin and had increased GI upset, so she tried to change to PM, but she keeps forgetting to take it. She admits polyphagia and denies hypoglycemia. Misty Bell continues to work on diet and exercise to decrease risk of diabetes.   ALLERGIES: No Known Allergies  MEDICATIONS: Current Outpatient Medications on File Prior to Visit  Medication Sig Dispense Refill  . amLODipine (NORVASC) 10 MG tablet TAKE 1 TABLET BY MOUTH EVERY DAY 90 tablet 1  . calcium citrate-vitamin D (CITRACAL+D) 315-200 MG-UNIT tablet Take 1 tablet by mouth 2 (two) times daily.    . cyanocobalamin 500 MCG tablet Take 500 mcg by mouth daily.    . furosemide (LASIX) 20 MG tablet Take 1 tablet (20 mg total) by mouth daily. 90 tablet 3  . metFORMIN (GLUCOPHAGE) 500 MG tablet TAKE 1 TABLET BY MOUTH EVERY DAY WITH BREAKFAST (Patient taking differently: Take one tablet every night) 30 tablet 0  . Multiple Vitamins-Minerals (CELEBRATE MULTI-COMPLETE 18) CHEW Chew by mouth daily.    . traZODone (DESYREL) 50 MG tablet Take 0.5-1 tablets (25-50 mg total) by mouth at bedtime as needed for sleep. 30 tablet 9  . Vitamin D, Ergocalciferol, (DRISDOL) 50000 units CAPS capsule  Take 1 capsule (50,000 Units total) by mouth every 7 (seven) days. 4 capsule 0   No current facility-administered medications on file prior to visit.     PAST MEDICAL HISTORY: Past Medical History:  Diagnosis Date  . Anxiety   . Anxiety disorder   . Back pain   . Diabetes mellitus type 2 in obese ([place])   . Hypertension 2010  . Knee pain   . Migraine   . Primary osteoarthritis of both knees   . Snoring   . Varicose veins     PAST SURGICAL HISTORY: Past Surgical History:  Procedure Laterality Date  . COLONOSCOPY    . DILATION AND CURETTAGE OF UTERUS     years ago-several  . FRACTURE SURGERY     neck  . HIATAL HERNIA REPAIR  09/14/2015   Procedure: LAPAROSCOPIC REPAIR OF HIATAL HERNIA;  Surgeon: Greer Pickerel, MD;  Location: [facility];  Service: General;;  . LAPAROSCOPIC GASTRIC SLEEVE RESECTION N/A 09/14/2015   Procedure: LAPAROSCOPIC GASTRIC SLEEVE RESECTION W/UPPER ENDO;  Surgeon: Greer Pickerel, MD;  Location: [facility];  Service: General;  Laterality: N/A;  . uterine ablation  08/2010    SOCIAL HISTORY: Social History   Tobacco Use  . Smoking status: Former Smoker    Types: Cigarettes    Last attempt to quit: 07/27/2015    Years since quitting: 2.3  . Smokeless tobacco: Never Used  Substance Use Topics  . Alcohol use: Yes  Comment: occassionally  . Drug use: No    FAMILY HISTORY: Family History  Problem Relation Age of Onset  . Heart disease Mother   . Cervical cancer Mother   . High blood pressure Mother   . High Cholesterol Mother   . Sudden death Mother   . Diabetes Other        [employer]  . Heart disease Other        female >2, [facility]  . Heart disease Sister   . Colon cancer Neg Hx     ROS: Review of Systems  Constitutional: Negative for weight loss.  Gastrointestinal: Positive for diarrhea and nausea.  Endo/Heme/Allergies:       Positive for polyphagia Negative for hypoglycemia    PHYSICAL EXAM: Blood pressure 116/76, pulse 89, height 5\' 2"  (1.575 m),  weight 199 lb (90.3 kg), SpO2 99 %. Body mass index is 36.4 kg/m. Physical Exam  Constitutional: She is oriented to person, place, and time. She appears well-developed and well-nourished.  Cardiovascular: Normal rate.  Pulmonary/Chest: Effort normal.  Musculoskeletal: Normal range of motion.  Neurological: She is oriented to person, place, and time.  Skin: Skin is warm and dry.  Psychiatric: She has a normal mood and affect. Her behavior is normal.  Vitals reviewed.   RECENT LABS AND TESTS: BMET    Component Value Date/Time   NA 143 09/25/2017 0944   K 4.0 09/25/2017 0944   CL 100 09/25/2017 0944   CO2 26 09/25/2017 0944   GLUCOSE 114 (H) 09/25/2017 0944   GLUCOSE 101 (H) 08/24/2017 1521   BUN 9 09/25/2017 0944   CREATININE 0.71 09/25/2017 0944   CREATININE 0.70 09/02/2015 0948   CALCIUM 9.6 09/25/2017 0944   GFRNONAA 94 09/25/2017 0944   GFRAA 108 09/25/2017 0944   Lab Results  Component Value Date   HGBA1C 6.3 09/21/2017   HGBA1C 6.0 (H) 08/23/2016   HGBA1C 7.0 (H) 09/07/2015   Lab Results  Component Value Date   INSULIN 11.9 09/25/2017   CBC    Component Value Date/Time   WBC 9.7 09/21/2017 1019   RBC 4.80 09/21/2017 1019   HGB 14.5 09/21/2017 1019   HCT 43.2 09/21/2017 1019   PLT 360.0 09/21/2017 1019   MCV 89.9 09/21/2017 1019   MCH 28.9 09/16/2015 0429   MCHC 33.5 09/21/2017 1019   RDW 14.5 09/21/2017 1019   LYMPHSABS 2.9 09/16/2015 0429   MONOABS 0.8 09/16/2015 0429   EOSABS 0.2 09/16/2015 0429   BASOSABS 0.0 09/16/2015 0429   Iron/TIBC/Ferritin/ %Sat No results found for: IRON, TIBC, FERRITIN, IRONPCTSAT Lipid Panel     Component Value Date/Time   CHOL 245 (H) 09/21/2017 1019   TRIG 107.0 09/21/2017 1019   HDL 66.80 09/21/2017 1019   CHOLHDL 4 09/21/2017 1019   VLDL 21.4 09/21/2017 1019   LDLCALC 157 (H) 09/21/2017 1019   Hepatic Function Panel     Component Value Date/Time   PROT 7.4 09/25/2017 0944   ALBUMIN 4.2 09/25/2017 0944    AST 15 09/25/2017 0944   ALT 13 09/25/2017 0944   ALKPHOS 98 09/25/2017 0944   BILITOT 0.4 09/25/2017 0944      Component Value Date/Time   TSH 0.963 09/25/2017 0944   TSH 0.97 08/24/2017 1521   Results for JAUNITA, MIKELS (MRN [id number]) as of 11/15/2017 14:22  Ref. Range 09/25/2017 09:44  Vitamin D, 25-Hydroxy Latest Ref Range: 30.0 - 100.0 ng/mL 30.2   ASSESSMENT AND PLAN: Prediabetes  Class 2 severe obesity  with serious comorbidity and body mass index (BMI) of 36.0 to 36.9 in adult, unspecified obesity type [facility])  PLAN:  Pre-Diabetes Aariya will continue to work on weight loss, exercise, and decreasing simple carbohydrates in her diet to help decrease the risk of diabetes. We dicussed metformin including benefits and risks. She was informed that eating too many simple carbohydrates or too many calories at one sitting increases the likelihood of GI side effects. Eilish has agreed to change metformin to take in the PM and a prescription was written today for 1 month refill. We will recheck labs in 2 weeks and Saniyah agreed to follow up with [place] as directed to monitor her progress.  We spent > than 50% of the 30 minute visit on the counseling as documented in the note.  Obesity Bricelyn is currently in the action stage of change. As such, her goal is to continue with weight loss efforts She has agreed to follow the Category 3 plan Deidra has been instructed to work up to a goal of 150 minutes of combined cardio and strengthening exercise per week for weight loss and overall health benefits. We discussed the following Behavioral Modification Strategies today: increasing lean protein intake, decreasing simple carbohydrates  and work on meal planning and easy cooking plans  We discussed phone app for online grocery shopping, to help with meal prep and avoid temptations.  Keeshia has agreed to follow up with our clinic in 3 weeks. She was informed of the importance of frequent follow up  visits to maximize her success with intensive lifestyle modifications for her multiple health conditions.   OBESITY BEHAVIORAL INTERVENTION VISIT  Today's visit was # 4 out of 22.  Starting weight: 203 lbs Starting date: 09/25/17 Today's weight : 199 lbs  Today's date: 11/14/2017 Total lbs lost to date: 4 (Patients must lose 7 lbs in the first 6 months to continue with counseling)   ASK: We discussed the diagnosis of obesity with Ellouise Newer today and Athenia agreed to give [place] permission to discuss obesity behavioral modification therapy today.  ASSESS: Chrislynn has the diagnosis of obesity and her BMI today is 36.39 Arilynn is in the action stage of change   ADVISE: Kamyia was educated on the multiple health risks of obesity as well as the benefit of weight loss to improve her health. She was advised of the need for long term treatment and the importance of lifestyle modifications.  AGREE: Multiple dietary modification options and treatment options were discussed and  Pamela agreed to the above obesity treatment plan.  I, Doreene Nest, am acting as transcriptionist for Dennard Nip, MD  I have reviewed the above documentation for accuracy and completeness, and I agree with the above. -Dennard Nip, MD

## 2017-12-05 ENCOUNTER — Ambulatory Visit (INDEPENDENT_AMBULATORY_CARE_PROVIDER_SITE_OTHER): Payer: BC Managed Care – PPO | Admitting: Family Medicine

## 2017-12-05 VITALS — BP 102/70 | HR 95 | Temp 98.4°F | Ht 62.0 in | Wt 203.0 lb

## 2017-12-05 DIAGNOSIS — Z6837 Body mass index (BMI) 37.0-37.9, adult: Secondary | ICD-10-CM

## 2017-12-05 DIAGNOSIS — E559 Vitamin D deficiency, unspecified: Secondary | ICD-10-CM | POA: Diagnosis not present

## 2017-12-05 MED ORDER — VITAMIN D (ERGOCALCIFEROL) 1.25 MG (50000 UNIT) PO CAPS: 50000.0000 [IU] | ORAL_CAPSULE | ORAL | 0 refills | Status: DC

## 2017-12-05 NOTE — Progress Notes (Signed)
Office: [phone]  /  Fax: [phone]   HPI:   Chief Complaint: OBESITY Misty Bell is here to discuss her progress with her obesity treatment plan. She is on the Category 3 plan and is following her eating plan approximately 75-80 % of the time. She states she is exercising 0 minutes 0 times per week. Misty Bell has gotten off track with increased work stress, but is ready to get back on track.  Her weight is 203 lb (92.1 kg) today and has gained 4 pounds since her last visit. She has lost 0 lbs since starting treatment with [place].  Vitamin D Deficiency Misty Bell has a diagnosis of vitamin D deficiency. She is stable on prescription Vit D, not yet at goal. She denies nausea, vomiting or muscle weakness.  ALLERGIES: No Known Allergies  MEDICATIONS: Current Outpatient Medications on File Prior to Visit  Medication Sig Dispense Refill  . amLODipine (NORVASC) 10 MG tablet TAKE 1 TABLET BY MOUTH EVERY DAY 90 tablet 1  . calcium citrate-vitamin D (CITRACAL+D) 315-200 MG-UNIT tablet Take 1 tablet by mouth 2 (two) times daily.    . cyanocobalamin 500 MCG tablet Take 500 mcg by mouth daily.    . furosemide (LASIX) 20 MG tablet Take 1 tablet (20 mg total) by mouth daily. 90 tablet 3  . metFORMIN (GLUCOPHAGE) 500 MG tablet TAKE 1 TABLET BY MOUTH EVERY DAY WITH BREAKFAST (Patient taking differently: Take one tablet every night) 30 tablet 0  . Multiple Vitamins-Minerals (CELEBRATE MULTI-COMPLETE 18) CHEW Chew by mouth daily.    . traZODone (DESYREL) 50 MG tablet Take 0.5-1 tablets (25-50 mg total) by mouth at bedtime as needed for sleep. 30 tablet 9   No current facility-administered medications on file prior to visit.     PAST MEDICAL HISTORY: Past Medical History:  Diagnosis Date  . Anxiety   . Anxiety disorder   . Back pain   . Diabetes mellitus type 2 in obese ([place])   . Hypertension 2010  . Knee pain   . Migraine   . Primary osteoarthritis of both knees   . Snoring   . Varicose veins      PAST SURGICAL HISTORY: Past Surgical History:  Procedure Laterality Date  . COLONOSCOPY    . DILATION AND CURETTAGE OF UTERUS     years ago-several  . FRACTURE SURGERY     neck  . HIATAL HERNIA REPAIR  09/14/2015   Procedure: LAPAROSCOPIC REPAIR OF HIATAL HERNIA;  Surgeon: Misty Pickerel, Misty Bell;  Location: [facility];  Service: General;;  . LAPAROSCOPIC GASTRIC SLEEVE RESECTION N/A 09/14/2015   Procedure: LAPAROSCOPIC GASTRIC SLEEVE RESECTION W/UPPER ENDO;  Surgeon: Misty Pickerel, Misty Bell;  Location: [facility];  Service: General;  Laterality: N/A;  . uterine ablation  08/2010    SOCIAL HISTORY: Social History   Tobacco Use  . Smoking status: Former Smoker    Types: Cigarettes    Last attempt to quit: 07/27/2015    Years since quitting: 2.3  . Smokeless tobacco: Never Used  Substance Use Topics  . Alcohol use: Yes    Comment: occassionally  . Drug use: No    FAMILY HISTORY: Family History  Problem Relation Age of Onset  . Heart disease Mother   . Cervical cancer Mother   . High blood pressure Mother   . High Cholesterol Mother   . Sudden death Mother   . Diabetes Other        [place]  . Heart disease Other  female >13, Misty Bell  . Heart disease Sister   . Colon cancer Neg Hx     ROS: Review of Systems  Constitutional: Negative for weight loss.  Gastrointestinal: Negative for nausea and vomiting.  Musculoskeletal:       Negative muscle weakness    PHYSICAL EXAM: Blood pressure 102/70, pulse 95, temperature 98.4 F (36.9 C), temperature source Oral, height 5\' 2"  (1.575 m), weight 203 lb (92.1 kg), SpO2 99 %. Body mass index is 37.13 kg/m. Physical Exam  Constitutional: She is oriented to person, place, and time. She appears well-developed and well-nourished.  Cardiovascular: Normal rate.  Pulmonary/Chest: Effort normal.  Musculoskeletal: Normal range of motion.  Neurological: She is oriented to person, place, and time.  Skin: Skin is warm and dry.  Psychiatric: She has a  normal mood and affect. Her behavior is normal.  Vitals reviewed.   RECENT LABS AND TESTS: BMET    Component Value Date/Time   NA 143 09/25/2017 0944   K 4.0 09/25/2017 0944   CL 100 09/25/2017 0944   CO2 26 09/25/2017 0944   GLUCOSE 114 (H) 09/25/2017 0944   GLUCOSE 101 (H) 08/24/2017 1521   BUN 9 09/25/2017 0944   CREATININE 0.71 09/25/2017 0944   CREATININE 0.70 09/02/2015 0948   CALCIUM 9.6 09/25/2017 0944   GFRNONAA 94 09/25/2017 0944   GFRAA 108 09/25/2017 0944   Lab Results  Component Value Date   HGBA1C 6.3 09/21/2017   HGBA1C 6.0 (H) 08/23/2016   HGBA1C 7.0 (H) 09/07/2015   Lab Results  Component Value Date   INSULIN 11.9 09/25/2017   CBC    Component Value Date/Time   WBC 9.7 09/21/2017 1019   RBC 4.80 09/21/2017 1019   HGB 14.5 09/21/2017 1019   HCT 43.2 09/21/2017 1019   PLT 360.0 09/21/2017 1019   MCV 89.9 09/21/2017 1019   MCH 28.9 09/16/2015 0429   MCHC 33.5 09/21/2017 1019   RDW 14.5 09/21/2017 1019   LYMPHSABS 2.9 09/16/2015 0429   MONOABS 0.8 09/16/2015 0429   EOSABS 0.2 09/16/2015 0429   BASOSABS 0.0 09/16/2015 0429   Iron/TIBC/Ferritin/ %Sat No results found for: IRON, TIBC, FERRITIN, IRONPCTSAT Lipid Panel     Component Value Date/Time   CHOL 245 (H) 09/21/2017 1019   TRIG 107.0 09/21/2017 1019   HDL 66.80 09/21/2017 1019   CHOLHDL 4 09/21/2017 1019   VLDL 21.4 09/21/2017 1019   LDLCALC 157 (H) 09/21/2017 1019   Hepatic Function Panel     Component Value Date/Time   PROT 7.4 09/25/2017 0944   ALBUMIN 4.2 09/25/2017 0944   AST 15 09/25/2017 0944   ALT 13 09/25/2017 0944   ALKPHOS 98 09/25/2017 0944   BILITOT 0.4 09/25/2017 0944      Component Value Date/Time   TSH 0.963 09/25/2017 0944   TSH 0.97 08/24/2017 1521  Results for HAMPTON, COST (MRN [id number]) as of 12/05/2017 12:44  Ref. Range 09/25/2017 09:44  Vitamin D, 25-Hydroxy Latest Ref Range: 30.0 - 100.0 ng/mL 30.2    ASSESSMENT AND PLAN: Vitamin D deficiency  - Plan: Vitamin D, Ergocalciferol, (DRISDOL) 50000 units CAPS capsule  Class 2 severe obesity with serious comorbidity and body mass index (BMI) of 37.0 to 37.9 in adult, unspecified obesity type ([place])  PLAN:  Vitamin D Deficiency Misty Bell was informed that low vitamin D levels contributes to fatigue and are associated with obesity, breast, and colon cancer. Misty Bell agrees to continue taking prescription Vit D @50 ,000 IU every week #4 and  we will refill for 1 month. She will follow up for routine testing of vitamin D, at least 2-3 times per year. She was informed of the risk of over-replacement of vitamin D and agrees to not increase her dose unless she discusses this with [place] first. Misty Bell agrees to follow up with our clinic in 2 to 3 weeks.  We spent > than 50% of the 30 minute visit on the counseling as documented in the note.  Obesity Misty Bell is currently in the action stage of change. As such, her goal is to continue with weight loss efforts She has agreed to follow the Category 3 plan Misty Bell has been instructed to work up to a goal of 150 minutes of combined cardio and strengthening exercise per week for weight loss and overall health benefits. We discussed the following Behavioral Modification Strategies today: increasing lean protein intake, decreasing simple carbohydrates, decrease eating out, work on meal planning and easy cooking plans, and emotional eating strategies   Misty Bell has agreed to follow up with our clinic in 2 to 3 weeks. She was informed of the importance of frequent follow up visits to maximize her success with intensive lifestyle modifications for her multiple health conditions.   OBESITY BEHAVIORAL INTERVENTION VISIT  Today's visit was # 5 out of 22.  Starting weight: 203 lbs Starting date: 09/25/17 Today's weight : 203 lbs Today's date: 12/05/2017 Total lbs lost to date: 0 (Patients must lose 7 lbs in the first 6 months to continue with counseling)   ASK: We  discussed the diagnosis of obesity with Misty Bell today and Misty Bell agreed to give [place] permission to discuss obesity behavioral modification therapy today.  ASSESS: Misty Bell has the diagnosis of obesity and her BMI today is 37.12 Misty Bell is in the action stage of change   ADVISE: Misty Bell was educated on the multiple health risks of obesity as well as the benefit of weight loss to improve her health. She was advised of the need for long term treatment and the importance of lifestyle modifications.  AGREE: Multiple dietary modification options and treatment options were discussed and  Misty Bell agreed to the above obesity treatment plan.  I, Trixie Dredge, am acting as transcriptionist for Dennard Nip, Misty Bell  I have reviewed the above documentation for accuracy and completeness, and I agree with the above. -Dennard Nip, Misty Bell

## 2017-12-14 ENCOUNTER — Other Ambulatory Visit: Payer: Self-pay | Admitting: Family Medicine

## 2017-12-14 NOTE — Telephone Encounter (Signed)
Pt. requesting lasix 20mg  qday refill, but refused, as last refilled 09/21/17 for #90 and 3 refills. My chart message sent to pt.

## 2017-12-20 ENCOUNTER — Other Ambulatory Visit (INDEPENDENT_AMBULATORY_CARE_PROVIDER_SITE_OTHER): Payer: Self-pay | Admitting: Family Medicine

## 2017-12-20 DIAGNOSIS — E559 Vitamin D deficiency, unspecified: Secondary | ICD-10-CM

## 2017-12-21 ENCOUNTER — Other Ambulatory Visit: Payer: Self-pay | Admitting: Family Medicine

## 2017-12-21 DIAGNOSIS — F5102 Adjustment insomnia: Secondary | ICD-10-CM

## 2017-12-22 ENCOUNTER — Other Ambulatory Visit (INDEPENDENT_AMBULATORY_CARE_PROVIDER_SITE_OTHER): Payer: Self-pay | Admitting: Family Medicine

## 2017-12-22 DIAGNOSIS — E559 Vitamin D deficiency, unspecified: Secondary | ICD-10-CM

## 2017-12-25 ENCOUNTER — Ambulatory Visit (INDEPENDENT_AMBULATORY_CARE_PROVIDER_SITE_OTHER): Payer: BC Managed Care – PPO | Admitting: Family Medicine

## 2018-01-16 ENCOUNTER — Ambulatory Visit (INDEPENDENT_AMBULATORY_CARE_PROVIDER_SITE_OTHER): Payer: BC Managed Care – PPO | Admitting: Family Medicine

## 2018-01-16 ENCOUNTER — Encounter (INDEPENDENT_AMBULATORY_CARE_PROVIDER_SITE_OTHER): Payer: Self-pay

## 2018-01-20 ENCOUNTER — Encounter (INDEPENDENT_AMBULATORY_CARE_PROVIDER_SITE_OTHER): Payer: Self-pay | Admitting: Family Medicine

## 2018-01-22 NOTE — Telephone Encounter (Signed)
Please address

## 2018-01-22 NOTE — Telephone Encounter (Signed)
Please address per Dr. Leafy Ro

## 2018-01-23 NOTE — Telephone Encounter (Signed)
Please address

## 2018-01-28 ENCOUNTER — Other Ambulatory Visit (INDEPENDENT_AMBULATORY_CARE_PROVIDER_SITE_OTHER): Payer: Self-pay | Admitting: Family Medicine

## 2018-01-28 DIAGNOSIS — E559 Vitamin D deficiency, unspecified: Secondary | ICD-10-CM

## 2018-05-04 ENCOUNTER — Ambulatory Visit: Payer: BC Managed Care – PPO | Admitting: Family Medicine

## 2018-05-04 ENCOUNTER — Encounter: Payer: Self-pay | Admitting: Family Medicine

## 2018-05-04 VITALS — BP 125/83 | HR 98 | Ht 62.0 in | Wt 205.1 lb

## 2018-05-04 DIAGNOSIS — N3000 Acute cystitis without hematuria: Secondary | ICD-10-CM | POA: Diagnosis not present

## 2018-05-04 LAB — POCT URINALYSIS DIPSTICK
Glucose, UA: NEGATIVE
PROTEIN UA: NEGATIVE

## 2018-05-04 MED ORDER — FLUCONAZOLE 150 MG PO TABS: 150.0000 mg | ORAL_TABLET | Freq: Once | ORAL | 1 refills | Status: AC

## 2018-05-04 MED ORDER — CIPROFLOXACIN HCL 500 MG PO TABS: 500.0000 mg | ORAL_TABLET | Freq: Two times a day (BID) | ORAL | 0 refills | Status: AC

## 2018-05-04 NOTE — Progress Notes (Signed)
   Subjective:    Patient ID: Misty Bell, female    DOB: [birth year], 62 y.o.   MRN: [id number]  HPI C/o 3 days of frequency, dysuria at the end of urination. No fevers, chills, nausea. No hematuria.   Review of Systems     Objective:   Physical Exam  Constitutional: She appears well-developed and well-nourished.  Cardiovascular: Normal rate and regular rhythm.  Pulmonary/Chest: Effort normal and breath sounds normal.  Abdominal: Soft. She exhibits no distension and no mass. There is tenderness (suprapubic). There is no rebound and no guarding.  Skin: Skin is warm and dry.      Assessment & Plan:  1. Acute cystitis without hematuria Cipro x 5 days. Diflucan for yeast. F/u prn. - POCT urinalysis dipstick - Urine Culture

## 2018-05-06 LAB — URINE CULTURE

## 2018-05-10 ENCOUNTER — Ambulatory Visit: Payer: BC Managed Care – PPO

## 2018-05-16 ENCOUNTER — Other Ambulatory Visit: Payer: Self-pay | Admitting: Family Medicine

## 2018-08-14 ENCOUNTER — Other Ambulatory Visit: Payer: Self-pay

## 2018-08-14 DIAGNOSIS — F5102 Adjustment insomnia: Secondary | ICD-10-CM

## 2018-08-14 MED ORDER — TRAZODONE HCL 50 MG PO TABS: 25.0000 mg | ORAL_TABLET | Freq: Every evening | ORAL | 1 refills | Status: DC | PRN

## 2018-09-23 ENCOUNTER — Encounter: Payer: Self-pay | Admitting: Family Medicine

## 2018-09-23 DIAGNOSIS — R600 Localized edema: Secondary | ICD-10-CM

## 2018-09-24 ENCOUNTER — Encounter: Payer: BC Managed Care – PPO | Admitting: Family Medicine

## 2018-09-26 MED ORDER — FUROSEMIDE 20 MG PO TABS: 20.0000 mg | ORAL_TABLET | Freq: Every day | ORAL | 0 refills | Status: DC

## 2018-09-26 MED ORDER — AMLODIPINE BESYLATE 10 MG PO TABS: 10.0000 mg | ORAL_TABLET | Freq: Every day | ORAL | 0 refills | Status: DC

## 2018-12-04 ENCOUNTER — Other Ambulatory Visit: Payer: Self-pay | Admitting: Family Medicine

## 2018-12-04 DIAGNOSIS — R600 Localized edema: Secondary | ICD-10-CM

## 2018-12-10 ENCOUNTER — Other Ambulatory Visit (HOSPITAL_BASED_OUTPATIENT_CLINIC_OR_DEPARTMENT_OTHER): Payer: Self-pay | Admitting: Family Medicine

## 2018-12-10 ENCOUNTER — Ambulatory Visit: Payer: BC Managed Care – PPO | Admitting: Family Medicine

## 2018-12-10 ENCOUNTER — Other Ambulatory Visit: Payer: Self-pay

## 2018-12-10 ENCOUNTER — Encounter: Payer: Self-pay | Admitting: Family Medicine

## 2018-12-10 VITALS — BP 129/82 | HR 87 | Temp 98.0°F | Resp 18 | Ht 62.0 in | Wt 213.0 lb

## 2018-12-10 DIAGNOSIS — Z1231 Encounter for screening mammogram for malignant neoplasm of breast: Secondary | ICD-10-CM

## 2018-12-10 DIAGNOSIS — L03115 Cellulitis of right lower limb: Secondary | ICD-10-CM | POA: Diagnosis not present

## 2018-12-10 DIAGNOSIS — M79661 Pain in right lower leg: Secondary | ICD-10-CM

## 2018-12-10 MED ORDER — CEPHALEXIN 500 MG PO CAPS: 500.0000 mg | ORAL_CAPSULE | Freq: Two times a day (BID) | ORAL | 0 refills | Status: DC

## 2018-12-10 NOTE — Patient Instructions (Signed)

## 2018-12-10 NOTE — Progress Notes (Signed)
Patient ID: Misty Bell, female    DOB: May 29, 1956  Age: 63 y.o. MRN: [id number]    Subjective:  Subjective  HPI Misty ERKER presents for red , painful Low R ext x 1 day.  No cp, no sob.  No known injury.  No long trips, flying or driving.    Review of Systems  Constitutional: Negative for appetite change, diaphoresis, fatigue and unexpected weight change.  Eyes: Negative for pain, redness and visual disturbance.  Respiratory: Negative for cough, chest tightness, shortness of breath and wheezing.   Cardiovascular: Negative for chest pain, palpitations and leg swelling.  Endocrine: Negative for cold intolerance, heat intolerance, polydipsia, polyphagia and polyuria.  Genitourinary: Negative for difficulty urinating, dysuria and frequency.  Skin: Positive for color change and rash.  Neurological: Negative for dizziness, light-headedness, numbness and headaches.    History Past Medical History:  Diagnosis Date  . Anxiety   . Anxiety disorder   . Back pain   . Diabetes mellitus type 2 in obese ([place])   . Hypertension 2010  . Knee pain   . Migraine   . Primary osteoarthritis of both knees   . Snoring   . Varicose veins     She has a past surgical history that includes Fracture surgery; uterine ablation (08/2010); Dilation and curettage of uterus; Colonoscopy; Laparoscopic gastric sleeve resection (N/A, 09/14/2015); and Hiatal hernia repair (09/14/2015).   Her family history includes Cervical cancer in her mother; Diabetes in an other family member; Heart disease in her mother, sister, and another family member; High Cholesterol in her mother; High blood pressure in her mother; Sudden death in her mother.She reports that she quit smoking about 3 years ago. Her smoking use included cigarettes. She has never used smokeless tobacco. She reports current alcohol use. She reports that she does not use drugs.  Current Outpatient Medications on File Prior to Visit  Medication Sig  Dispense Refill  . amLODipine (NORVASC) 10 MG tablet TAKE 1 TABLET BY MOUTH EVERY DAY 30 tablet 0  . calcium citrate-vitamin D (CITRACAL+D) 315-200 MG-UNIT tablet Take 1 tablet by mouth 2 (two) times daily.    . cyanocobalamin 500 MCG tablet Take 500 mcg by mouth daily.    . furosemide (LASIX) 20 MG tablet TAKE 1 TABLET BY MOUTH EVERY DAY 30 tablet 0  . ibuprofen (ADVIL,MOTRIN) 200 MG tablet Take 200 mg by mouth every 6 (six) hours as needed for mild pain.    . metFORMIN (GLUCOPHAGE) 500 MG tablet TAKE 1 TABLET BY MOUTH EVERY DAY WITH BREAKFAST 30 tablet 0  . Multiple Vitamins-Minerals (CELEBRATE MULTI-COMPLETE 18) CHEW Chew by mouth daily.    . traZODone (DESYREL) 50 MG tablet Take 0.5-1 tablets (25-50 mg total) by mouth at bedtime as needed for sleep. 90 tablet 1  . Vitamin D, Ergocalciferol, (DRISDOL) 50000 units CAPS capsule Take 1 capsule (50,000 Units total) by mouth every 7 (seven) days. 4 capsule 0   No current facility-administered medications on file prior to visit.      Objective:  Objective  Physical Exam Vitals signs and nursing note reviewed.  Constitutional:      Appearance: She is well-developed.  HENT:     Head: Normocephalic and atraumatic.  Eyes:     Conjunctiva/sclera: Conjunctivae normal.  Neck:     Musculoskeletal: Normal range of motion and neck supple.     Thyroid: No thyromegaly.     Vascular: No carotid bruit or JVD.  Cardiovascular:     Rate  and Rhythm: Normal rate and regular rhythm.     Heart sounds: Normal heart sounds. No murmur.  Pulmonary:     Effort: Pulmonary effort is normal. No respiratory distress.     Breath sounds: Normal breath sounds. No wheezing or rales.  Chest:     Chest wall: No tenderness.  Musculoskeletal:        General: Tenderness present.  Skin:    Findings: Erythema present.       Neurological:     Mental Status: She is alert and oriented to person, place, and time.    BP 129/82 (BP Location: Left Arm, Patient Position:  Sitting, Cuff Size: Large)   Pulse 87   Temp 98 F (36.7 C) (Oral)   Resp 18   Ht 5\' 2"  (1.575 m)   Wt 213 lb (96.6 kg)   SpO2 100%   BMI 38.96 kg/m  Wt Readings from Last 3 Encounters:  12/10/18 213 lb (96.6 kg)  05/04/18 205 lb 1.3 oz (93 kg)  12/05/17 203 lb (92.1 kg)     Lab Results  Component Value Date   WBC 9.7 09/21/2017   HGB 14.5 09/21/2017   HCT 43.2 09/21/2017   PLT 360.0 09/21/2017   GLUCOSE 114 (H) 09/25/2017   CHOL 245 (H) 09/21/2017   TRIG 107.0 09/21/2017   HDL 66.80 09/21/2017   LDLCALC 157 (H) 09/21/2017   ALT 13 09/25/2017   AST 15 09/25/2017   NA 143 09/25/2017   K 4.0 09/25/2017   CL 100 09/25/2017   CREATININE 0.71 09/25/2017   BUN 9 09/25/2017   CO2 26 09/25/2017   TSH 0.963 09/25/2017   HGBA1C 6.3 09/21/2017   MICROALBUR <0.7 09/21/2017    Mm Screening Breast Tomo Bilateral  Result Date: 09/22/2017 CLINICAL DATA:  Screening. EXAM: DIGITAL SCREENING BILATERAL MAMMOGRAM WITH TOMO AND CAD COMPARISON:  Previous exam(s). ACR Breast Density Category b: There are scattered areas of fibroglandular density. FINDINGS: There are no findings suspicious for malignancy. Images were processed with CAD. IMPRESSION: No mammographic evidence of malignancy. A result letter of this screening mammogram will be mailed directly to the patient. RECOMMENDATION: Screening mammogram in one year. (Code:SM-B-01Y) BI-RADS CATEGORY  1: Negative. Electronically Signed   By: Nolon Nations M.D.   On: 09/22/2017 07:42     Assessment & Plan:  Plan  I am having Misty Bell. [place] start on cephALEXin. I am also having her maintain her Celebrate Multi-Complete 18, calcium citrate-vitamin D, vitamin B-12, metFORMIN, Vitamin D (Ergocalciferol), ibuprofen, traZODone, amLODipine, and furosemide.  Meds ordered this encounter  Medications  . cephALEXin (KEFLEX) 500 MG capsule    Sig: Take 1 capsule (500 mg total) by mouth 2 (two) times daily.    Dispense:  20 capsule     Refill:  0    Problem List Items Addressed This Visit    None    Visit Diagnoses    Cellulitis of right lower leg    -  Primary   Relevant Medications   cephALEXin (KEFLEX) 500 MG capsule   Other Relevant Orders   US Venous Img Lower Unilateral Right   Right calf pain       Relevant Orders   US Venous Img Lower Unilateral Right      Follow-up: Return in about 1 week (around 12/17/2018), or if symptoms worsen or fail to improve.  Ann Held, DO

## 2018-12-11 ENCOUNTER — Ambulatory Visit (HOSPITAL_BASED_OUTPATIENT_CLINIC_OR_DEPARTMENT_OTHER)
Admission: RE | Admit: 2018-12-11 | Discharge: 2018-12-11 | Disposition: A | Discharge status: Home / Self Care | Payer: BC Managed Care – PPO | Source: Ambulatory Visit | Attending: Family Medicine | Admitting: Family Medicine

## 2018-12-11 DIAGNOSIS — M79661 Pain in right lower leg: Secondary | ICD-10-CM | POA: Diagnosis present

## 2018-12-11 DIAGNOSIS — L03115 Cellulitis of right lower limb: Secondary | ICD-10-CM

## 2018-12-14 ENCOUNTER — Ambulatory Visit (HOSPITAL_BASED_OUTPATIENT_CLINIC_OR_DEPARTMENT_OTHER): Payer: BC Managed Care – PPO

## 2018-12-19 ENCOUNTER — Ambulatory Visit (HOSPITAL_BASED_OUTPATIENT_CLINIC_OR_DEPARTMENT_OTHER): Payer: BC Managed Care – PPO

## 2018-12-20 ENCOUNTER — Ambulatory Visit: Payer: BC Managed Care – PPO | Admitting: Family Medicine

## 2018-12-23 NOTE — Progress Notes (Addendum)
[facility] [address] [place],  [address] [phone]  Date:  12/24/2018   Name:  Misty Bell   DOB:  [birth year]   MRN:  [id number]  PCP:  Darreld Mclean, MD    Chief Complaint: Cellulitis (1 week follow up)   History of Present Illness:  Misty Bell is a 63 y.o. very pleasant female patient who presents with the following:  Last seen by myself in March 2019 Misty Bell was seen by my partner Dr. Etter Sjogren on June 15 with cellulitis of her right lower extremity She was treated with Keflex, had an ultrasound which was negative for DVT She is now finished with the Keflex, and thankfully her cellulitis seems to have resolved.  The redness is gone, she has minimal to no swelling  History of high cholesterol, back pain, diet-controlled diabetes, gastric sleeve surgery in the past  She got a bruise on on her right thigh just recently -she is not sure what caused it, does not recall any trauma.  She is not having frequent bruising or nosebleeds or any other abnormal bleeding   Lab Results  Component Value Date   HGBA1C 6.3 09/21/2017   She is overdue for labs today- catch up today Foot exam due Eye exam- done in December  Colon cancer screening- she is due, will refer to GI   She had been on metformin but ran out of this - NOT taking in some time  She is taking amlodipine and also lasix Trazodone for sleep   Patient Active Problem List   Diagnosis Date Noted  . Other fatigue 09/25/2017  . Shortness of breath on exertion 09/25/2017  . Prediabetes 09/25/2017  . Status post gastric banding 09/25/2017  . Hypercholesterolemia 09/14/2015  . Obesity, Class III, BMI 40-49.9 (morbid obesity) ([place]) 09/14/2015  . Hiatal hernia 09/14/2015  . Osteoarthritis of both knees 09/14/2015  . DOE (dyspnea on exertion) 09/02/2015  . Anxiety disorder 04/01/2015  . Back pain 04/01/2015  . Diabetes mellitus type  2 in obese ([place]) 04/01/2015  . Essential hypertension 04/01/2015  . Migraine 04/01/2015  . Spider nevus of skin 10/29/2012    Past Medical History:  Diagnosis Date  . Anxiety   . Anxiety disorder   . Back pain   . Diabetes mellitus type 2 in obese ([place])   . Hypertension 2010  . Knee pain   . Migraine   . Primary osteoarthritis of both knees   . Snoring   . Varicose veins     Past Surgical History:  Procedure Laterality Date  . COLONOSCOPY    . DILATION AND CURETTAGE OF UTERUS     years ago-several  . FRACTURE SURGERY     neck  . HIATAL HERNIA REPAIR  09/14/2015   Procedure: LAPAROSCOPIC REPAIR OF HIATAL HERNIA;  Surgeon: Greer Pickerel, MD;  Location: [facility];  Service: General;;  . LAPAROSCOPIC GASTRIC SLEEVE RESECTION N/A 09/14/2015   Procedure: LAPAROSCOPIC GASTRIC SLEEVE RESECTION W/UPPER ENDO;  Surgeon: Greer Pickerel, MD;  Location: [facility];  Service: General;  Laterality: N/A;  . uterine ablation  08/2010    Social History   Tobacco Use  . Smoking status: Former Smoker    Types: Cigarettes    Quit date: 07/27/2015    Years since quitting: 3.4  . Smokeless tobacco: Never Used  Substance Use Topics  . Alcohol use: Yes    Comment: occassionally  . Drug  use: No    Family History  Problem Relation Age of Onset  . Heart disease Mother   . Cervical cancer Mother   . High blood pressure Mother   . High Cholesterol Mother   . Sudden death Mother   . Diabetes Other        Misty Bell  . Heart disease Other        female >80, [facility]  . Heart disease Sister   . Colon cancer Neg Hx     No Known Allergies  Medication list has been reviewed and updated.  Current Outpatient Medications on File Prior to Visit  Medication Sig Dispense Refill  . calcium citrate-vitamin D (CITRACAL+D) 315-200 MG-UNIT tablet Take 1 tablet by mouth 2 (two) times daily.    . cyanocobalamin 500 MCG tablet Take 500 mcg by mouth daily.    [name] ibuprofen (ADVIL,MOTRIN) 200 MG tablet Take 200 mg by mouth  every 6 (six) hours as needed for mild pain.    . metFORMIN (GLUCOPHAGE) 500 MG tablet TAKE 1 TABLET BY MOUTH EVERY DAY WITH BREAKFAST 30 tablet 0  . Multiple Vitamins-Minerals (CELEBRATE MULTI-COMPLETE 18) CHEW Chew by mouth daily.    . Vitamin D, Ergocalciferol, (DRISDOL) 50000 units CAPS capsule Take 1 capsule (50,000 Units total) by mouth every 7 (seven) days. 4 capsule 0   No current facility-administered medications on file prior to visit.     Review of Systems:  As per HPI- otherwise negative.    Physical Examination: Vitals:   12/24/18 1345  BP: 116/80  Pulse: (!) 104  Resp: 16  Temp: 97.8 F (36.6 C)  SpO2: 97%   Vitals:   12/24/18 1345  Weight: 211 lb (95.7 kg)  Height: 5\' 2"  (1.575 m)   Body mass index is 38.59 kg/m. Ideal Body Weight: Weight in (lb) to have BMI = 25: 136.4  GEN: WDWN, NAD, Non-toxic, A & O x 3, obese, looks well HEENT: Atraumatic, Normocephalic. Neck supple. No masses, No LAD.  TM wnl bilaterally  Ears and Nose: No external deformity. CV: RRR, No M/G/R. No JVD. No thrill. No extra heart sounds. PULM: CTA B, no wheezes, crackles, rhonchi. No retractions. No resp. distress. No accessory muscle use. Small bruise on her right anterior thigh EXTR: No c/c/e NEURO Normal gait.  PSYCH: Normally interactive. Conversant. Not depressed or anxious appearing.  Calm demeanor.  Cellulitis of her right leg is now resolved Normal foot exam bilaterally   Assessment and Plan:   ICD-10-CM   1. Controlled type 2 diabetes mellitus without complication, without long-term current use of insulin (HCC)  E11.9 CBC    Comprehensive metabolic panel    Hemoglobin A1c    Microalbumin / creatinine urine ratio    amLODipine (NORVASC) 10 MG tablet  2. Cellulitis of right lower leg  L03.115   3. Essential hypertension  I10 CBC    Comprehensive metabolic panel  4. Hyperlipidemia associated with type 2 diabetes mellitus (HCC)  E11.69 Lipid panel   E78.5   5. Pedal  edema  R60.0 furosemide (LASIX) 20 MG tablet  6. Adjustment insomnia  F51.02 traZODone (DESYREL) 50 MG tablet  7. Screening for colon cancer  Z12.11 Ambulatory referral to Gastroenterology   Following up today on routine health maintenance and also recent cellulitis.  Cellulitis of her right lower extremity is now resolved.  I have cautioned her to let me know if symptoms seem to return Refilled her blood pressure medication and Lasix for edema Refill trazodone for  sleep Referral to GI for routine colonoscopy Will obtain labs, depend on A1c may start back on metformin   Follow-up: No follow-ups on file.  Meds ordered this encounter  Medications  . amLODipine (NORVASC) 10 MG tablet    Sig: Take 1 tablet (10 mg total) by mouth daily.    Dispense:  90 tablet    Refill:  3  . furosemide (LASIX) 20 MG tablet    Sig: Take 1 tablet (20 mg total) by mouth daily.    Dispense:  90 tablet    Refill:  3  . traZODone (DESYREL) 50 MG tablet    Sig: Take 0.5-1 tablets (25-50 mg total) by mouth at bedtime as needed for sleep.    Dispense:  90 tablet    Refill:  3   Orders Placed This Encounter  Procedures  . CBC  . Comprehensive metabolic panel  . Hemoglobin A1c  . Lipid panel  . Microalbumin / creatinine urine ratio  . Ambulatory referral to Gastroenterology    @SIGN @  Outpatient Encounter Medications as of 12/24/2018  Medication Sig  . amLODipine (NORVASC) 10 MG tablet Take 1 tablet (10 mg total) by mouth daily.  . calcium citrate-vitamin D (CITRACAL+D) 315-200 MG-UNIT tablet Take 1 tablet by mouth 2 (two) times daily.  . cyanocobalamin 500 MCG tablet Take 500 mcg by mouth daily.  . furosemide (LASIX) 20 MG tablet Take 1 tablet (20 mg total) by mouth daily.  [name] ibuprofen (ADVIL,MOTRIN) 200 MG tablet Take 200 mg by mouth every 6 (six) hours as needed for mild pain.  . metFORMIN (GLUCOPHAGE) 500 MG tablet TAKE 1 TABLET BY MOUTH EVERY DAY WITH BREAKFAST  . Multiple Vitamins-Minerals  (CELEBRATE MULTI-COMPLETE 18) CHEW Chew by mouth daily.  . traZODone (DESYREL) 50 MG tablet Take 0.5-1 tablets (25-50 mg total) by mouth at bedtime as needed for sleep.  . Vitamin D, Ergocalciferol, (DRISDOL) 50000 units CAPS capsule Take 1 capsule (50,000 Units total) by mouth every 7 (seven) days.  . [DISCONTINUED] amLODipine (NORVASC) 10 MG tablet TAKE 1 TABLET BY MOUTH EVERY DAY  . [DISCONTINUED] furosemide (LASIX) 20 MG tablet TAKE 1 TABLET BY MOUTH EVERY DAY  . [DISCONTINUED] traZODone (DESYREL) 50 MG tablet Take 0.5-1 tablets (25-50 mg total) by mouth at bedtime as needed for sleep.  . [DISCONTINUED] cephALEXin (KEFLEX) 500 MG capsule Take 1 capsule (500 mg total) by mouth 2 (two) times daily.   No facility-administered encounter medications on file as of 12/24/2018.      Signed Lamar Blinks, MD  Received her labs 6/30- message to pt  Results for orders placed or performed in visit on 12/24/18  CBC  Result Value Ref Range   WBC 9.4 4.0 - 10.5 K/uL   RBC 4.88 3.87 - 5.11 Mil/uL   Platelets 350.0 150.0 - 400.0 K/uL   Hemoglobin 14.4 12.0 - 15.0 g/dL   HCT 43.8 36.0 - 46.0 %   MCV 89.7 78.0 - 100.0 fl   MCHC 32.9 30.0 - 36.0 g/dL   RDW 14.2 11.5 - 15.5 %  Comprehensive metabolic panel  Result Value Ref Range   Sodium 139 135 - 145 mEq/L   Potassium 3.9 3.5 - 5.1 mEq/L   Chloride 102 96 - 112 mEq/L   CO2 30 19 - 32 mEq/L   Glucose, Bld 114 (H) 70 - 99 mg/dL   BUN 17 6 - 23 mg/dL   Creatinine, Ser 0.62 0.40 - 1.20 mg/dL   Total Bilirubin 0.3 0.2 - 1.2  mg/dL   Alkaline Phosphatase 76 39 - 117 U/L   AST 13 0 - 37 U/L   ALT 13 0 - 35 U/L   Total Protein 7.1 6.0 - 8.3 g/dL   Albumin 3.9 3.5 - 5.2 g/dL   Calcium 9.1 8.4 - 10.5 mg/dL   GFR 118.41 >60.00 mL/min  Hemoglobin A1c  Result Value Ref Range   Hgb A1c MFr Bld 6.8 (H) 4.6 - 6.5 %  Lipid panel  Result Value Ref Range   Cholesterol 263 (H) 0 - 200 mg/dL   Triglycerides 147.0 0.0 - 149.0 mg/dL   HDL 64.90 >39.00  mg/dL   VLDL 29.4 0.0 - 40.0 mg/dL   LDL Cholesterol 169 (H) 0 - 99 mg/dL   Total CHOL/HDL Ratio 4    NonHDL 198.12   Microalbumin / creatinine urine ratio  Result Value Ref Range   Microalb, Ur <0.7 0.0 - 1.9 mg/dL   Creatinine,U 102.6 mg/dL   Microalb Creat Ratio 0.7 0.0 - 30.0 mg/g    Her A1c has gone up, will suggest putting her back on metformin Also suggest a cholesterol medication  The 10-year ASCVD risk score Mikey Bussing DC Jr., et al., 2013) is: 13.8%   Values used to calculate the score:     Age: 15 years     Sex: Female     Is Non-Hispanic African American: Yes     Diabetic: Yes     Tobacco smoker: No     Systolic Blood Pressure: 474 mmHg     Is BP treated: Yes     HDL Cholesterol: 64.9 mg/dL     Total Cholesterol: 263 mg/dL

## 2018-12-24 ENCOUNTER — Encounter (HOSPITAL_BASED_OUTPATIENT_CLINIC_OR_DEPARTMENT_OTHER): Payer: Self-pay

## 2018-12-24 ENCOUNTER — Other Ambulatory Visit: Payer: Self-pay

## 2018-12-24 ENCOUNTER — Encounter: Payer: Self-pay | Admitting: Family Medicine

## 2018-12-24 ENCOUNTER — Ambulatory Visit (HOSPITAL_BASED_OUTPATIENT_CLINIC_OR_DEPARTMENT_OTHER)
Admission: RE | Admit: 2018-12-24 | Discharge: 2018-12-24 | Disposition: A | Discharge status: Home / Self Care | Payer: BC Managed Care – PPO | Source: Ambulatory Visit | Attending: Family Medicine | Admitting: Family Medicine

## 2018-12-24 ENCOUNTER — Ambulatory Visit: Payer: BC Managed Care – PPO | Admitting: Family Medicine

## 2018-12-24 VITALS — BP 116/80 | HR 90 | Temp 97.8°F | Resp 16 | Ht 62.0 in | Wt 211.0 lb

## 2018-12-24 DIAGNOSIS — E1169 Type 2 diabetes mellitus with other specified complication: Secondary | ICD-10-CM | POA: Diagnosis not present

## 2018-12-24 DIAGNOSIS — I10 Essential (primary) hypertension: Secondary | ICD-10-CM

## 2018-12-24 DIAGNOSIS — E119 Type 2 diabetes mellitus without complications: Secondary | ICD-10-CM | POA: Diagnosis not present

## 2018-12-24 DIAGNOSIS — L03115 Cellulitis of right lower limb: Secondary | ICD-10-CM

## 2018-12-24 DIAGNOSIS — E785 Hyperlipidemia, unspecified: Secondary | ICD-10-CM | POA: Diagnosis not present

## 2018-12-24 DIAGNOSIS — F5102 Adjustment insomnia: Secondary | ICD-10-CM

## 2018-12-24 DIAGNOSIS — Z1211 Encounter for screening for malignant neoplasm of colon: Secondary | ICD-10-CM

## 2018-12-24 DIAGNOSIS — R600 Localized edema: Secondary | ICD-10-CM

## 2018-12-24 DIAGNOSIS — Z1231 Encounter for screening mammogram for malignant neoplasm of breast: Secondary | ICD-10-CM

## 2018-12-24 MED ORDER — TRAZODONE HCL 50 MG PO TABS: 25.0000 mg | ORAL_TABLET | Freq: Every evening | ORAL | 3 refills | Status: DC | PRN

## 2018-12-24 MED ORDER — AMLODIPINE BESYLATE 10 MG PO TABS: 10.0000 mg | ORAL_TABLET | Freq: Every day | ORAL | 3 refills | Status: DC

## 2018-12-24 MED ORDER — FUROSEMIDE 20 MG PO TABS: 20.0000 mg | ORAL_TABLET | Freq: Every day | ORAL | 3 refills | Status: DC

## 2018-12-24 NOTE — Patient Instructions (Signed)
Great to see you today- your leg looks great, let me know if your leg infection seems to be coming back at all Otherwise I will be in touch with your labs We will see if you need to go back on metformin Let me know if you don't hear from GI about your screening colonoscopy

## 2018-12-25 ENCOUNTER — Encounter: Payer: Self-pay | Admitting: Family Medicine

## 2018-12-25 DIAGNOSIS — I83819 Varicose veins of unspecified lower extremities with pain: Secondary | ICD-10-CM

## 2018-12-25 DIAGNOSIS — O2200 Varicose veins of lower extremity in pregnancy, unspecified trimester: Secondary | ICD-10-CM

## 2018-12-25 LAB — COMPREHENSIVE METABOLIC PANEL
ALT: 13 U/L (ref 0–35)
AST: 13 U/L (ref 0–37)
Albumin: 3.9 g/dL (ref 3.5–5.2)
Alkaline Phosphatase: 76 U/L (ref 39–117)
BUN: 17 mg/dL (ref 6–23)
CO2: 30 mEq/L (ref 19–32)
Calcium: 9.1 mg/dL (ref 8.4–10.5)
Chloride: 102 mEq/L (ref 96–112)
Creatinine, Ser: 0.62 mg/dL (ref 0.40–1.20)
GFR: 118.41 mL/min (ref >60.00)
Glucose, Bld: 114 mg/dL — ABNORMAL HIGH (ref 70–99)
Potassium: 3.9 mEq/L (ref 3.5–5.1)
Sodium: 139 mEq/L (ref 135–145)
Total Bilirubin: 0.3 mg/dL (ref 0.2–1.2)
Total Protein: 7.1 g/dL (ref 6.0–8.3)

## 2018-12-25 LAB — LIPID PANEL
Cholesterol: 263 mg/dL — ABNORMAL HIGH (ref 0–200)
HDL: 64.9 mg/dL (ref >39.00)
LDL Cholesterol: 169 mg/dL — ABNORMAL HIGH (ref 0–99)
NonHDL: 198.12
Total CHOL/HDL Ratio: 4
Triglycerides: 147 mg/dL (ref 0.0–149.0)
VLDL: 29.4 mg/dL (ref 0.0–40.0)

## 2018-12-25 LAB — CBC
HCT: 43.8 % (ref 36.0–46.0)
Hemoglobin: 14.4 g/dL (ref 12.0–15.0)
MCHC: 32.9 g/dL (ref 30.0–36.0)
MCV: 89.7 fl (ref 78.0–100.0)
Platelets: 350 10*3/uL (ref 150.0–400.0)
RBC: 4.88 Mil/uL (ref 3.87–5.11)
RDW: 14.2 % (ref 11.5–15.5)
WBC: 9.4 10*3/uL (ref 4.0–10.5)

## 2018-12-25 LAB — MICROALBUMIN / CREATININE URINE RATIO
Creatinine,U: 102.6 mg/dL
Microalb Creat Ratio: 0.7 mg/g (ref 0.0–30.0)
Microalb, Ur: <0.7 mg/dL (ref 0.0–1.9)

## 2018-12-25 LAB — HEMOGLOBIN A1C: Hgb A1c MFr Bld: 6.8 % — ABNORMAL HIGH (ref 4.6–6.5)

## 2018-12-27 ENCOUNTER — Ambulatory Visit (HOSPITAL_BASED_OUTPATIENT_CLINIC_OR_DEPARTMENT_OTHER): Payer: BC Managed Care – PPO

## 2019-01-01 ENCOUNTER — Other Ambulatory Visit: Payer: Self-pay | Admitting: Family Medicine

## 2019-01-01 DIAGNOSIS — E119 Type 2 diabetes mellitus without complications: Secondary | ICD-10-CM

## 2019-01-01 DIAGNOSIS — R600 Localized edema: Secondary | ICD-10-CM

## 2019-01-14 ENCOUNTER — Encounter: Payer: Self-pay | Admitting: Family Medicine

## 2019-01-14 DIAGNOSIS — R7303 Prediabetes: Secondary | ICD-10-CM

## 2019-01-14 DIAGNOSIS — E785 Hyperlipidemia, unspecified: Secondary | ICD-10-CM

## 2019-01-14 MED ORDER — LOVASTATIN 20 MG PO TABS: 20.0000 mg | ORAL_TABLET | Freq: Every day | ORAL | 3 refills | Status: DC

## 2019-01-14 MED ORDER — METFORMIN HCL 500 MG PO TABS: ORAL_TABLET | ORAL | 3 refills | Status: DC

## 2019-01-23 ENCOUNTER — Other Ambulatory Visit: Payer: Self-pay

## 2019-01-23 ENCOUNTER — Ambulatory Visit (AMBULATORY_SURGERY_CENTER): Payer: Self-pay

## 2019-01-23 VITALS — Ht 62.0 in | Wt 205.0 lb

## 2019-01-23 DIAGNOSIS — Z86010 Personal history of colonic polyps: Secondary | ICD-10-CM

## 2019-01-23 MED ORDER — SUPREP BOWEL PREP KIT 17.5-3.13-1.6 GM/177ML PO SOLN: 1.0000 | Freq: Once | ORAL | 0 refills | Status: AC

## 2019-01-23 NOTE — Progress Notes (Signed)
Per pt, no allergies to soy or egg products.Pt not taking any weight loss meds or using  O2 at home. Pt denies any sedation problems.  Emmi video info mailed to pt  The PV was done over the phone due to COVID-19. Verified pt's address and insurance. Reviewed pt's medical hx and prep instructions. Informed pt to call with any questions or changes prior to her procedure. Pt understood.

## 2019-02-05 ENCOUNTER — Telehealth: Payer: Self-pay | Admitting: Gastroenterology

## 2019-02-05 NOTE — Telephone Encounter (Signed)
Pt responded "no" to all screening questions °

## 2019-02-05 NOTE — Telephone Encounter (Signed)
Left message on vmail regarding Covid-19 screening questions. °Covid-19 Screening Questions: °  °Do you now or have you had a fever in the last 14 days?  °  °Do you have any respiratory symptoms of shortness of breath or cough now or in the last 14 days?  °  °Do you have any family members or close contacts with diagnosed  °or suspected Covid-19 in the past 14 days?  °  °Have you been tested for Covid-19 and found to be positive?  °  ° °

## 2019-02-06 ENCOUNTER — Encounter: Payer: Self-pay | Admitting: Gastroenterology

## 2019-02-06 ENCOUNTER — Ambulatory Visit (AMBULATORY_SURGERY_CENTER): Payer: BC Managed Care – PPO | Admitting: Gastroenterology

## 2019-02-06 ENCOUNTER — Other Ambulatory Visit: Payer: Self-pay

## 2019-02-06 VITALS — BP 140/73 | HR 80 | Temp 98.0°F | Resp 32 | Ht 62.0 in | Wt 205.0 lb

## 2019-02-06 DIAGNOSIS — D128 Benign neoplasm of rectum: Secondary | ICD-10-CM

## 2019-02-06 DIAGNOSIS — D123 Benign neoplasm of transverse colon: Secondary | ICD-10-CM

## 2019-02-06 DIAGNOSIS — K621 Rectal polyp: Secondary | ICD-10-CM

## 2019-02-06 DIAGNOSIS — D122 Benign neoplasm of ascending colon: Secondary | ICD-10-CM | POA: Diagnosis not present

## 2019-02-06 DIAGNOSIS — Z86010 Personal history of colonic polyps: Secondary | ICD-10-CM | POA: Diagnosis present

## 2019-02-06 DIAGNOSIS — D129 Benign neoplasm of anus and anal canal: Secondary | ICD-10-CM

## 2019-02-06 MED ORDER — SODIUM CHLORIDE 0.9 % IV SOLN: 500.0000 mL | Freq: Once | INTRAVENOUS | Status: DC

## 2019-02-06 NOTE — Progress Notes (Signed)
Pt's states no medical or surgical changes since previsit or office visit.  Cw vitals and JB temps.

## 2019-02-06 NOTE — Progress Notes (Signed)
A/ox3, pleased with MAC, report to RN 

## 2019-02-06 NOTE — Progress Notes (Signed)
Called to room to assist during endoscopic procedure.  Patient ID and intended procedure confirmed with present staff. Received instructions for my participation in the procedure from the performing physician.  

## 2019-02-06 NOTE — Patient Instructions (Signed)
YOU HAD AN ENDOSCOPIC PROCEDURE TODAY AT [facility]:   Refer to the procedure report that was given to you for any specific questions about what was found during the examination.  If the procedure report does not answer your questions, please call your gastroenterologist to clarify.  If you requested that your care partner not be given the details of your procedure findings, then the procedure report has been included in a sealed envelope for you to review at your convenience later.  **Handouts given on Diverticulosis, hemorrhoids, and polyps**  YOU SHOULD EXPECT: Some feelings of bloating in the abdomen. Passage of more gas than usual.  Walking can help get rid of the air that was put into your GI tract during the procedure and reduce the bloating. If you had a lower endoscopy (such as a colonoscopy or flexible sigmoidoscopy) you may notice spotting of blood in your stool or on the toilet paper. If you underwent a bowel prep for your procedure, you may not have a normal bowel movement for a few days.  Please Note:  You might notice some irritation and congestion in your nose or some drainage.  This is from the oxygen used during your procedure.  There is no need for concern and it should clear up in a day or so.  SYMPTOMS TO REPORT IMMEDIATELY:   Following lower endoscopy (colonoscopy or flexible sigmoidoscopy):  Excessive amounts of blood in the stool  Significant tenderness or worsening of abdominal pains  Swelling of the abdomen that is new, acute  Fever of 100F or higher    For urgent or emergent issues, a gastroenterologist can be reached at any hour by calling [phone].   DIET:  We do recommend a small meal at first, but then you may proceed to your regular diet.  Drink plenty of fluids but you should avoid alcoholic beverages for 24 hours.  ACTIVITY:  You should plan to take it easy for the rest of today and you should NOT DRIVE or use heavy machinery until  tomorrow (because of the sedation medicines used during the test).    FOLLOW UP: Our staff will call the number listed on your records 48-72 hours following your procedure to check on you and address any questions or concerns that you may have regarding the information given to you following your procedure. If we do not reach you, we will leave a message.  We will attempt to reach you two times.  During this call, we will ask if you have developed any symptoms of COVID 19. If you develop any symptoms (ie: fever, flu-like symptoms, shortness of breath, cough etc.) before then, please call [phone].  If you test positive for Covid 19 in the 2 weeks post procedure, please call and report this information to [place].    If any biopsies were taken you will be contacted by phone or by letter within the next 1-3 weeks.  Please call us at [phone] if you have not heard about the biopsies in 3 weeks.    SIGNATURES/CONFIDENTIALITY: You and/or your care partner have signed paperwork which will be entered into your electronic medical record.  These signatures attest to the fact that that the information above on your After Visit Summary has been reviewed and is understood.  Full responsibility of the confidentiality of this discharge information lies with you and/or your care-partner.

## 2019-02-06 NOTE — Op Note (Signed)
[facility] Patient Name: Misty Bell Procedure Date: 02/06/2019 1:16 PM MRN: [id number] Endoscopist: Mauri Pole , MD Age: 63 Referring MD:  Date of Birth: Nov 08, 1955 Gender: Female Account #: [id number] Procedure:                Colonoscopy Indications:              High risk colon cancer surveillance: Personal                            history of colonic polyps, High risk colon cancer                            surveillance: Personal history of adenoma (10 mm or                            greater in size) Medicines:                Monitored Anesthesia Care Procedure:                Pre-Anesthesia Assessment:                           - Prior to the procedure, a History and Physical                            was performed, and patient medications and                            allergies were reviewed. The patient's tolerance of                            previous anesthesia was also reviewed. The risks                            and benefits of the procedure and the sedation                            options and risks were discussed with the patient.                            All questions were answered, and informed consent                            was obtained. Prior Anticoagulants: The patient has                            taken no previous anticoagulant or antiplatelet                            agents. ASA Grade Assessment: III - A patient with                            severe systemic disease. After reviewing the risks  and benefits, the patient was deemed in                            satisfactory condition to undergo the procedure.                           After obtaining informed consent, the colonoscope                            was passed under direct vision. Throughout the                            procedure, the patient's blood pressure, pulse, and                            oxygen saturations were monitored  continuously. The                            Colonoscope was introduced through the anus and                            advanced to the the cecum, identified by                            appendiceal orifice and ileocecal valve. The                            colonoscopy was performed without difficulty. The                            patient tolerated the procedure well. The quality                            of the bowel preparation was excellent. The                            ileocecal valve, appendiceal orifice, and rectum                            were photographed. Scope In: 1:20:46 PM Scope Out: 1:46:22 PM Scope Withdrawal Time: 0 hours 19 minutes 50 seconds  Total Procedure Duration: 0 hours 25 minutes 36 seconds  Findings:                 The perianal and digital rectal examinations were                            normal.                           Two sessile polyps were found in the transverse                            colon. The polyps were 4 to 7 mm in size. These  polyps were removed with a cold snare. Resection                            and retrieval were complete.                           Two sessile polyps were found in the rectum and                            ascending colon. The polyps were 1 to 2 mm in size.                            These polyps were removed with a cold biopsy                            forceps. Resection and retrieval were complete.                           A 11 mm polyp was found in the transverse colon.                            The polyp was pedunculated. The polyp was removed                            with a hot snare. Resection and retrieval were                            complete.                           Scattered small and large-mouthed diverticula were                            found in the sigmoid colon, descending colon,                            transverse colon and ascending colon.                            Non-bleeding internal hemorrhoids were found during                            retroflexion. The hemorrhoids were small.                           No additional abnormalities were found on                            retroflexion. Complications:            No immediate complications. Estimated Blood Loss:     Estimated blood loss was minimal. Impression:               - Two 4 to 7 mm polyps in the transverse colon,  removed with a cold snare. Resected and retrieved.                           - Two 1 to 2 mm polyps in the rectum and in the                            ascending colon, removed with a cold biopsy                            forceps. Resected and retrieved.                           - One 11 mm polyp in the transverse colon, removed                            with a hot snare. Resected and retrieved.                           - Moderate diverticulosis in the sigmoid colon, in                            the descending colon, in the transverse colon and                            in the ascending colon.                           - Non-bleeding internal hemorrhoids. Recommendation:           - Patient has a contact number available for                            emergencies. The signs and symptoms of potential                            delayed complications were discussed with the                            patient. Return to normal activities tomorrow.                            Written discharge instructions were provided to the                            patient.                           - Resume previous diet.                           - Continue present medications.                           - Await pathology results.                           -  Repeat colonoscopy in 3 years for surveillance                            based on pathology results. Mauri Pole, MD 02/06/2019 1:53:58 PM This report has been signed electronically.

## 2019-02-07 ENCOUNTER — Encounter: Payer: Self-pay | Admitting: Gastroenterology

## 2019-02-08 ENCOUNTER — Telehealth: Payer: Self-pay | Admitting: *Deleted

## 2019-02-08 NOTE — Telephone Encounter (Signed)
  Follow up Call-  Call back number 02/06/2019  Post procedure Call Back phone  # [phone]  Permission to leave phone message Yes  Some recent data might be hidden     Patient questions:  Do you have a fever, pain , or abdominal swelling? No. Pain Score  0 *  Have you tolerated food without any problems? Yes.    Have you been able to return to your normal activities? Yes.    Do you have any questions about your discharge instructions: Diet   No. Medications  No. Follow up visit  No.  Do you have questions or concerns about your Care? No.  Actions: * If pain score is 4 or above: No action needed, pain <4.  1. Have you developed a fever since your procedure? no  2.   Have you had an respiratory symptoms (SOB or cough) since your procedure? no  3.   Have you tested positive for COVID 19 since your procedure no  4.   Have you had any family members/close contacts diagnosed with the COVID 19 since your procedure?  no   If yes to any of these questions please route to Joylene John, RN and Alphonsa Gin, Therapist, sports.

## 2019-02-18 ENCOUNTER — Encounter: Payer: Self-pay | Admitting: Gastroenterology

## 2019-03-13 ENCOUNTER — Encounter: Payer: Self-pay | Admitting: Family Medicine

## 2019-03-14 ENCOUNTER — Other Ambulatory Visit: Payer: Self-pay

## 2019-03-14 ENCOUNTER — Ambulatory Visit: Payer: BC Managed Care – PPO | Admitting: Family Medicine

## 2019-03-14 ENCOUNTER — Encounter: Payer: Self-pay | Admitting: Family Medicine

## 2019-03-14 VITALS — BP 118/80 | HR 90 | Temp 97.5°F | Resp 16 | Ht 62.0 in | Wt 215.0 lb

## 2019-03-14 DIAGNOSIS — E785 Hyperlipidemia, unspecified: Secondary | ICD-10-CM

## 2019-03-14 DIAGNOSIS — Z23 Encounter for immunization: Secondary | ICD-10-CM

## 2019-03-14 DIAGNOSIS — T148XXA Other injury of unspecified body region, initial encounter: Secondary | ICD-10-CM

## 2019-03-14 DIAGNOSIS — E119 Type 2 diabetes mellitus without complications: Secondary | ICD-10-CM

## 2019-03-14 DIAGNOSIS — R410 Disorientation, unspecified: Secondary | ICD-10-CM

## 2019-03-14 DIAGNOSIS — K5900 Constipation, unspecified: Secondary | ICD-10-CM

## 2019-03-14 DIAGNOSIS — I10 Essential (primary) hypertension: Secondary | ICD-10-CM | POA: Diagnosis not present

## 2019-03-14 LAB — HEMOGLOBIN A1C: Hgb A1c MFr Bld: 6.9 % — ABNORMAL HIGH (ref 4.6–6.5)

## 2019-03-14 LAB — TSH: TSH: 1.06 u[IU]/mL (ref 0.35–4.50)

## 2019-03-14 NOTE — Patient Instructions (Addendum)
It was nice to see you again today- I will be in touch with your A1c and thyroid level asap  Let's try having your NOT take the lovastatin for about 2 weeks- let me know if you notice any difference in your mental clarity   I don't think your bruising is likely anything serious, as recnet blood counts were normal.  However please do let me know if you start to have any nosebleeds, or blood in urine or stool, or if bruising gets worse  For constipating, I would recommend that you add a stool softener such as colace to your routine as needed.  Exercise and increased water intake can also help   We can do your shingles vaccine at a later date.

## 2019-03-14 NOTE — Progress Notes (Addendum)
[facility] [place] at [facility] [place], [place], [place] [address] [phone]  Date:  03/14/2019   Name:  Misty Bell   DOB:  May 19, 1956   MRN:  [id number]  PCP:  Darreld Mclean, MD    Chief Complaint: Mental Clarity (feels scattered, not focusing, focus factor recommendation)   History of Present Illness:  Misty Bell is a 63 y.o. very pleasant female patient who presents with the following:  Here today for periodic follow-up visit Last seen by myself in June to follow-up on cellulitis of her right lower extremity which had improved with Keflex treatment- this has continued to resolve and is now gone  At that time her A1c had gone up, I suggested starting her back on metformin and also recommended starting a cholesterol medication- we did both of these things   She wondered about supplements that she saw on TV to improve memory- like focus factor and previgen- do these really work?  She had sent me this question but I did not get it as she was scheduled for appt instead She feels like focusing has become more difficult over the last couple of months. ?could this be related to starting cholesterol med- she had not made this connection She is working from home and it is harder for her to stay on task, could be due to not being at the office Nothing really unusual like getting lost in a familiar setting or forgetting a familiar object - more hard to get tasks completed  She wonders if she may have ADD, has often thought that this might be the case.  Never formally evaluated for same   She had her routine colonoscopy about 1 month ago Can do A1c today if she likes, will be just couple weeks early Flu shot- give today  Shingrix- will do another time after discussion  She notes that she will occasionally have a bruise that she does not recall getting-has a small bruise on her left arm right now No nosebleeds, no blood in  stool or urine She also feels like she is not as regular as she should be  She does use MiraLAX on occasion  Patient Active Problem List   Diagnosis Date Noted  . Other fatigue 09/25/2017  . Shortness of breath on exertion 09/25/2017  . Status post gastric banding 09/25/2017  . Hypercholesterolemia 09/14/2015  . Obesity, Class III, BMI 40-49.9 (morbid obesity) ([place]) 09/14/2015  . Hiatal hernia 09/14/2015  . Osteoarthritis of both knees 09/14/2015  . DOE (dyspnea on exertion) 09/02/2015  . Anxiety disorder 04/01/2015  . Back pain 04/01/2015  . Diabetes mellitus type 2 in obese ([place]) 04/01/2015  . Essential hypertension 04/01/2015  . Migraine 04/01/2015  . Spider nevus of skin 10/29/2012    Past Medical History:  Diagnosis Date  . Anxiety   . Anxiety disorder   . Back pain   . Diabetes mellitus type 2 in obese (H[facility])    pre-diabetic  . Hypertension 2010  . Knee pain   . Migraine   . Primary osteoarthritis of both knees   . Snoring   . Varicose veins     Past Surgical History:  Procedure Laterality Date  . COLONOSCOPY    . DILATION AND CURETTAGE OF UTERUS     years ago-several  . FRACTURE SURGERY     neck/ 18 years ago  . HIATAL HERNIA REPAIR  09/14/2015   Procedure: LAPAROSCOPIC  REPAIR OF HIATAL HERNIA;  Surgeon: Greer Pickerel, MD;  Location: [facility];  Service: General;;  . LAPAROSCOPIC GASTRIC SLEEVE RESECTION N/A 09/14/2015   Procedure: LAPAROSCOPIC GASTRIC SLEEVE RESECTION W/UPPER ENDO;  Surgeon: Greer Pickerel, MD;  Location: [facility];  Service: General;  Laterality: N/A;  . uterine ablation  08/2010    Social History   Tobacco Use  . Smoking status: Former Smoker    Types: Cigarettes    Quit date: 07/27/2015    Years since quitting: 3.6  . Smokeless tobacco: Never Used  Substance Use Topics  . Alcohol use: Yes    Alcohol/week: 4.0 standard drinks    Types: 4 Glasses of wine per week  . Drug use: No    Family History  Problem Relation Age of Onset  . Heart  disease Mother   . Cervical cancer Mother   . High blood pressure Mother   . High Cholesterol Mother   . Sudden death Mother   . Other Father   . Diabetes Other        [place]  . Heart disease Other        female >74, [facility]  . Pneumonia Sister   . Heart disease Sister   . Healthy Sister   . Colon cancer Neg Hx     No Known Allergies  Medication list has been reviewed and updated.  Current Outpatient Medications on File Prior to Visit  Medication Sig Dispense Refill  . amLODipine (NORVASC) 10 MG tablet TAKE 1 TABLET BY MOUTH EVERY DAY 30 tablet 5  . calcium citrate-vitamin D (CITRACAL+D) 315-200 MG-UNIT tablet Take 1 tablet by mouth 2 (two) times daily.    . cyanocobalamin 500 MCG tablet Take 500 mcg by mouth daily.    . furosemide (LASIX) 20 MG tablet TAKE 1 TABLET BY MOUTH EVERY DAY 30 tablet 5  . ibuprofen (ADVIL,MOTRIN) 200 MG tablet Take 200 mg by mouth every 6 (six) hours as needed for mild pain.    [name] lovastatin (MEVACOR) 20 MG tablet Take 1 tablet (20 mg total) by mouth at bedtime. 90 tablet 3  . metFORMIN (GLUCOPHAGE) 500 MG tablet TAKE 1 TABLET BY MOUTH EVERY DAY WITH BREAKFAST 90 tablet 3  . Multiple Vitamins-Minerals (CELEBRATE MULTI-COMPLETE 18) CHEW Chew by mouth daily.    . traZODone (DESYREL) 50 MG tablet Take 0.5-1 tablets (25-50 mg total) by mouth at bedtime as needed for sleep. 90 tablet 3  . Vitamin D, Ergocalciferol, (DRISDOL) 50000 units CAPS capsule Take 1 capsule (50,000 Units total) by mouth every 7 (seven) days. 4 capsule 0   No current facility-administered medications on file prior to visit.     Review of Systems:  As per HPI- otherwise negative.   Physical Examination: Vitals:   03/14/19 0925  BP: 118/80  Pulse: 90  Resp: 16  Temp: (!) 97.5 F (36.4 C)  SpO2: 95%   Vitals:   03/14/19 0925  Weight: 215 lb (97.5 kg)  Height: 5\' 2"  (1.575 m)   Body mass index is 39.32 kg/m. Ideal Body Weight: Weight in (lb) to have BMI = 25: 136.4  GEN:  WDWN, NAD, Non-toxic, A & O x 3, obese, looks well  HEENT: Atraumatic, Normocephalic. Neck supple. No masses, No LAD. Ears and Nose: No external deformity. CV: RRR, No M/G/R. No JVD. No thrill. No extra heart sounds. PULM: CTA B, no wheezes, crackles, rhonchi. No retractions. No resp. distress. No accessory muscle use. ABD: S, NT, ND, +BS. No rebound. No  HSM.  Benign belly  EXTR: No c/c/e NEURO Normal gait.  PSYCH: Normally interactive. Conversant. Not depressed or anxious appearing.  Calm demeanor.  Small bruise on her left upper arm, nickel size   Assessment and Plan: Dyslipidemia  Essential hypertension  Controlled type 2 diabetes mellitus without complication, without long-term current use of insulin ([place]) - Plan: Hemoglobin A1c  Mental confusion - Plan: TSH  Bruise  Constipation, unspecified constipation type  Here today for follow-up visit.  We started her on lovastatin about 2 months ago for dyslipidemia.  She has noticed some difficulty with concentration since that time, though did not make a connection with the new medication.  She does suspect she may actually just have ADD.  We will have her try holding her statin for couple of weeks and see if this makes a difference.  If she does notice a difference we will try a different statin for her.  If no difference, will have her restart statin and sent for ADHD evaluation Also check thyroid Check A1c today Reassured that recent CBC is normal, I do not think her small bruise is pathologic but please alert me if anything changes Try Colace as needed for occasional constipation  Will plan further follow- up pending labs.   Signed Lamar Blinks, MD  Received her labs, message to patient  Results for orders placed or performed in visit on 03/14/19  Hemoglobin A1c  Result Value Ref Range   Hgb A1c MFr Bld 6.9 (H) 4.6 - 6.5 %  TSH  Result Value Ref Range   TSH 1.06 0.35 - 4.50 uIU/mL

## 2019-03-28 ENCOUNTER — Other Ambulatory Visit: Payer: Self-pay

## 2019-03-28 DIAGNOSIS — I83899 Varicose veins of unspecified lower extremities with other complications: Secondary | ICD-10-CM

## 2019-03-31 ENCOUNTER — Encounter: Payer: Self-pay | Admitting: Family Medicine

## 2019-04-01 ENCOUNTER — Ambulatory Visit (INDEPENDENT_AMBULATORY_CARE_PROVIDER_SITE_OTHER): Payer: BC Managed Care – PPO | Admitting: Family Medicine

## 2019-04-01 ENCOUNTER — Other Ambulatory Visit: Payer: Self-pay

## 2019-04-01 ENCOUNTER — Telehealth: Payer: Self-pay

## 2019-04-01 ENCOUNTER — Encounter: Payer: Self-pay | Admitting: Family Medicine

## 2019-04-01 ENCOUNTER — Other Ambulatory Visit: Payer: Self-pay | Admitting: Family Medicine

## 2019-04-01 ENCOUNTER — Telehealth (HOSPITAL_COMMUNITY): Payer: Self-pay

## 2019-04-01 DIAGNOSIS — Z20822 Contact with and (suspected) exposure to covid-19: Secondary | ICD-10-CM

## 2019-04-01 DIAGNOSIS — Z20828 Contact with and (suspected) exposure to other viral communicable diseases: Secondary | ICD-10-CM

## 2019-04-01 DIAGNOSIS — G8929 Other chronic pain: Secondary | ICD-10-CM

## 2019-04-01 DIAGNOSIS — R519 Headache, unspecified: Secondary | ICD-10-CM | POA: Diagnosis not present

## 2019-04-01 NOTE — Progress Notes (Signed)
[place] at [facility] [address] [place], [place] [address] [phone]  Date:  04/01/2019   Name:  Misty Bell   DOB:  27-Aug-1955   MRN:  [id number]  PCP:  Darreld Mclean, MD    Chief Complaint: No chief complaint on file.   History of Present Illness:  Misty Bell is a 63 y.o. very pleasant female patient who presents with the following:  Virtual visit today with pt for concern of feeling anxious and overwhelmed  Pt is at work, provider is at office Pt ID confirmed with 2 factors, she gives consent for virtual visit today   We visited 2-3 weeks ago for a routine recheck visit  She started to feel poorly over the last week or so-she is not sure if she is sick or just stressed out History of well controlled DM Lab Results  Component Value Date   HGBA1C 6.9 (H) 03/14/2019    She has noted a headache for about one week- not severe It is a dull headache She tried tylenol for her HA, ibuprofen. Both provide temporarily relief "I think I'm stressed out."  She feels overwhelmed and run down Her legs are aching No cough or flu like sx No fever No SOB, no chest pain She does not think she has an ear infection Really she thinks she is just overwhelmed and worn out   It is the anniversary of her son's death.  "a bunch of stuff" is causing her stress.  Her job is stressful.  Patient Active Problem List   Diagnosis Date Noted  . Other fatigue 09/25/2017  . Shortness of breath on exertion 09/25/2017  . Status post gastric banding 09/25/2017  . Hypercholesterolemia 09/14/2015  . Obesity, Class III, BMI 40-49.9 (morbid obesity) ([place]) 09/14/2015  . Hiatal hernia 09/14/2015  . Osteoarthritis of both knees 09/14/2015  . DOE (dyspnea on exertion) 09/02/2015  . Anxiety disorder 04/01/2015  . Back pain 04/01/2015  . Diabetes mellitus type 2 in obese ([place]) 04/01/2015  . Essential hypertension 04/01/2015   . Migraine 04/01/2015  . Spider nevus of skin 10/29/2012    Past Medical History:  Diagnosis Date  . Anxiety   . Anxiety disorder   . Back pain   . Diabetes mellitus type 2 in obese (HCC)    pre-diabetic  . Hypertension 2010  . Knee pain   . Migraine   . Primary osteoarthritis of both knees   . Snoring   . Varicose veins     Past Surgical History:  Procedure Laterality Date  . COLONOSCOPY    . DILATION AND CURETTAGE OF UTERUS     years ago-several  . FRACTURE SURGERY     neck/ 18 years ago  . HIATAL HERNIA REPAIR  09/14/2015   Procedure: LAPAROSCOPIC REPAIR OF HIATAL HERNIA;  Surgeon: Greer Pickerel, MD;  Location: [facility];  Service: General;;  . LAPAROSCOPIC GASTRIC SLEEVE RESECTION N/A 09/14/2015   Procedure: LAPAROSCOPIC GASTRIC SLEEVE RESECTION W/UPPER ENDO;  Surgeon: Greer Pickerel, MD;  Location: [facility];  Service: General;  Laterality: N/A;  . uterine ablation  08/2010    Social History   Tobacco Use  . Smoking status: Former Smoker    Types: Cigarettes    Quit date: 07/27/2015    Years since quitting: 3.6  . Smokeless tobacco: Never Used  Substance Use Topics  . Alcohol use: Yes    Alcohol/week: 4.0 standard drinks  Types: 4 Glasses of wine per week  . Drug use: No    Family History  Problem Relation Age of Onset  . Heart disease Mother   . Cervical cancer Mother   . High blood pressure Mother   . High Cholesterol Mother   . Sudden death Mother   . Other Father   . Diabetes Other        [place]  . Heart disease Other        female >12, [facility]  . Pneumonia Sister   . Heart disease Sister   . Healthy Sister   . Colon cancer Neg Hx     No Known Allergies  Medication list has been reviewed and updated.  Current Outpatient Medications on File Prior to Visit  Medication Sig Dispense Refill  . amLODipine (NORVASC) 10 MG tablet TAKE 1 TABLET BY MOUTH EVERY DAY 30 tablet 5  . calcium citrate-vitamin D (CITRACAL+D) 315-200 MG-UNIT tablet Take 1 tablet by mouth  2 (two) times daily.    . cyanocobalamin 500 MCG tablet Take 500 mcg by mouth daily.    . furosemide (LASIX) 20 MG tablet TAKE 1 TABLET BY MOUTH EVERY DAY 30 tablet 5  . ibuprofen (ADVIL,MOTRIN) 200 MG tablet Take 200 mg by mouth every 6 (six) hours as needed for mild pain.    [name] lovastatin (MEVACOR) 20 MG tablet Take 1 tablet (20 mg total) by mouth at bedtime. 90 tablet 3  . metFORMIN (GLUCOPHAGE) 500 MG tablet TAKE 1 TABLET BY MOUTH EVERY DAY WITH BREAKFAST 90 tablet 3  . Multiple Vitamins-Minerals (CELEBRATE MULTI-COMPLETE 18) CHEW Chew by mouth daily.    . traZODone (DESYREL) 50 MG tablet Take 0.5-1 tablets (25-50 mg total) by mouth at bedtime as needed for sleep. 90 tablet 3  . Vitamin D, Ergocalciferol, (DRISDOL) 50000 units CAPS capsule Take 1 capsule (50,000 Units total) by mouth every 7 (seven) days. 4 capsule 0   No current facility-administered medications on file prior to visit.     Review of Systems:  As per HPI- otherwise negative.   Physical Examination: There were no vitals filed for this visit. There were no vitals filed for this visit. There is no height or weight on file to calculate BMI. Ideal Body Weight:    Patient was observed over video.  She looks well, no cough, shortness of breath, distress is observed   Assessment and Plan: Chronic nonintractable headache, unspecified headache type  Virtual visit today to discuss mild headache and general feeling of malaise.  She thinks that she is stressed out from work and COVID-19, but also has complaint of muscle aches and headache.  I do think she should be tested for COVID, I ordered this test for her earlier today.  Advised her how and where to be tested, she plans to this ASAP. Until results come in and have advised her to self isolate and rest at home.  She will let me know if getting worse, or if not feeling better soon She may use some accrued sick time to take a few weeks off of work.  If she needs to do this I  am glad to fill out FMLA for her Assuming her covid test is negative we can see her in the office for follow-up as needed Provided a work note for her today Signed Lamar Blinks, MD

## 2019-04-01 NOTE — Progress Notes (Deleted)
[place] at [facility] [address], [place], [place] [address] [phone]  Date:  04/03/2019   Name:  Misty Bell   DOB:  [birth year]   MRN:  [id number]  PCP:  Darreld Mclean, MD    Chief Complaint: No chief complaint on file.   History of Present Illness:  Misty Bell is a 63 y.o. very pleasant female patient who presents with the following:  Here today for an office visit to discuss anxiety Patient has history of diabetes, hypertension, hyperlipidemia as well as anxiety Last seen by myself in mid September-at that point she complained of some difficulty with concentration.  We had started her on a statin recently, I suggested that she try holding her statin for 2 weeks and see if this made a difference in her concentration abilities.  If not, she was considering evaluation for ADHD  Amlodipine Lasix Lovastatin Metformin Trazodone at bedtime  Lab Results  Component Value Date   HGBA1C 6.9 (H) 03/14/2019     Patient Active Problem List   Diagnosis Date Noted  . Other fatigue 09/25/2017  . Shortness of breath on exertion 09/25/2017  . Status post gastric banding 09/25/2017  . Hypercholesterolemia 09/14/2015  . Obesity, Class III, BMI 40-49.9 (morbid obesity) ([place]) 09/14/2015  . Hiatal hernia 09/14/2015  . Osteoarthritis of both knees 09/14/2015  . DOE (dyspnea on exertion) 09/02/2015  . Anxiety disorder 04/01/2015  . Back pain 04/01/2015  . Diabetes mellitus type 2 in obese ([place]) 04/01/2015  . Essential hypertension 04/01/2015  . Migraine 04/01/2015  . Spider nevus of skin 10/29/2012    Past Medical History:  Diagnosis Date  . Anxiety   . Anxiety disorder   . Back pain   . Diabetes mellitus type 2 in obese (H[facility])    pre-diabetic  . Hypertension 2010  . Knee pain   . Migraine   . Primary osteoarthritis of both knees   . Snoring   . Varicose veins     Past Surgical History:   Procedure Laterality Date  . COLONOSCOPY    . DILATION AND CURETTAGE OF UTERUS     years ago-several  . FRACTURE SURGERY     neck/ 18 years ago  . HIATAL HERNIA REPAIR  09/14/2015   Procedure: LAPAROSCOPIC REPAIR OF HIATAL HERNIA;  Surgeon: Greer Pickerel, MD;  Location: [facility];  Service: General;;  . LAPAROSCOPIC GASTRIC SLEEVE RESECTION N/A 09/14/2015   Procedure: LAPAROSCOPIC GASTRIC SLEEVE RESECTION W/UPPER ENDO;  Surgeon: Greer Pickerel, MD;  Location: [facility];  Service: General;  Laterality: N/A;  . uterine ablation  08/2010    Social History   Tobacco Use  . Smoking status: Former Smoker    Types: Cigarettes    Quit date: 07/27/2015    Years since quitting: 3.6  . Smokeless tobacco: Never Used  Substance Use Topics  . Alcohol use: Yes    Alcohol/week: 4.0 standard drinks    Types: 4 Glasses of wine per week  . Drug use: No    Family History  Problem Relation Age of Onset  . Heart disease Mother   . Cervical cancer Mother   . High blood pressure Mother   . High Cholesterol Mother   . Sudden death Mother   . Other Father   . Diabetes Other        [place]  . Heart disease Other        female >72, [facility]  .  Pneumonia Sister   . Heart disease Sister   . Healthy Sister   . Colon cancer Neg Hx     No Known Allergies  Medication list has been reviewed and updated.  Current Outpatient Medications on File Prior to Visit  Medication Sig Dispense Refill  . amLODipine (NORVASC) 10 MG tablet TAKE 1 TABLET BY MOUTH EVERY DAY 30 tablet 5  . calcium citrate-vitamin D (CITRACAL+D) 315-200 MG-UNIT tablet Take 1 tablet by mouth 2 (two) times daily.    . cyanocobalamin 500 MCG tablet Take 500 mcg by mouth daily.    . furosemide (LASIX) 20 MG tablet TAKE 1 TABLET BY MOUTH EVERY DAY 30 tablet 5  . ibuprofen (ADVIL,MOTRIN) 200 MG tablet Take 200 mg by mouth every 6 (six) hours as needed for mild pain.    [name] lovastatin (MEVACOR) 20 MG tablet Take 1 tablet (20 mg total) by mouth at bedtime. 90  tablet 3  . metFORMIN (GLUCOPHAGE) 500 MG tablet TAKE 1 TABLET BY MOUTH EVERY DAY WITH BREAKFAST 90 tablet 3  . Multiple Vitamins-Minerals (CELEBRATE MULTI-COMPLETE 18) CHEW Chew by mouth daily.    . traZODone (DESYREL) 50 MG tablet Take 0.5-1 tablets (25-50 mg total) by mouth at bedtime as needed for sleep. 90 tablet 3  . Vitamin D, Ergocalciferol, (DRISDOL) 50000 units CAPS capsule Take 1 capsule (50,000 Units total) by mouth every 7 (seven) days. 4 capsule 0   No current facility-administered medications on file prior to visit.     Review of Systems:  As per HPI- otherwise negative.   Physical Examination: There were no vitals filed for this visit. There were no vitals filed for this visit. There is no height or weight on file to calculate BMI. Ideal Body Weight:    GEN: WDWN, NAD, Non-toxic, A & O x 3 HEENT: Atraumatic, Normocephalic. Neck supple. No masses, No LAD. Ears and Nose: No external deformity. CV: RRR, No M/G/R. No JVD. No thrill. No extra heart sounds. PULM: CTA B, no wheezes, crackles, rhonchi. No retractions. No resp. distress. No accessory muscle use. ABD: S, NT, ND, +BS. No rebound. No HSM. EXTR: No c/c/e NEURO Normal gait.  PSYCH: Normally interactive. Conversant. Not depressed or anxious appearing.  Calm demeanor.    Assessment and Plan: ***  Signed Lamar Blinks, MD

## 2019-04-01 NOTE — Telephone Encounter (Signed)
Patient scheduled for virtual visit  

## 2019-04-01 NOTE — Telephone Encounter (Signed)
Copied from [place] [id number]. Topic: General - Inquiry >> Apr 01, 2019  4:15 PM Richardo Priest, NT wrote: Reason for CRM: Patient called in stating she would like PCP to inform her if she can go to her appointment tomorrow due to have been waiting over 2-3 months and then go get covid tested. Please advise.

## 2019-04-01 NOTE — Telephone Encounter (Signed)

## 2019-04-02 ENCOUNTER — Ambulatory Visit (HOSPITAL_COMMUNITY)
Admission: RE | Admit: 2019-04-02 | Discharge: 2019-04-02 | Disposition: A | Discharge status: Home / Self Care | Payer: BC Managed Care – PPO | Source: Ambulatory Visit | Attending: Family | Admitting: Family

## 2019-04-02 ENCOUNTER — Ambulatory Visit: Payer: BC Managed Care – PPO | Admitting: Vascular Surgery

## 2019-04-02 ENCOUNTER — Encounter: Payer: Self-pay | Admitting: Vascular Surgery

## 2019-04-02 DIAGNOSIS — I83899 Varicose veins of unspecified lower extremities with other complications: Secondary | ICD-10-CM | POA: Insufficient documentation

## 2019-04-02 DIAGNOSIS — I872 Venous insufficiency (chronic) (peripheral): Secondary | ICD-10-CM | POA: Insufficient documentation

## 2019-04-02 HISTORY — DX: Venous insufficiency (chronic) (peripheral): I87.2

## 2019-04-02 NOTE — Telephone Encounter (Signed)
I called her back this am and we spoke.  She had a vascular study planned for today and was not sure if she should go.  I asked her to contact the vascular clinic and update them first

## 2019-04-02 NOTE — Progress Notes (Signed)
Patient name: Misty Bell MRN: [id number] DOB: 1955/07/16 Sex: female  REASON FOR CONSULT: Ev[place]ate varicose veins  HPI: Misty Bell is a 63 y.o. female, with history of hypertension, prediabetes and anxiety that presents for evaluation of varicose veins in her lower extremities.  Patient particularly notes an area where she has a patch of varicosities on her right lateral thigh at the knee level that can be particularly bothersome.  States this area has been present for several years but seem to be getting worse.  She also complains of leg swelling and some heaviness at the end of the day.  She has been wearing knee-high compression but never worn thigh-high compression.  No venous ulcers.  No history of blood clots.  No trauma to her legs.  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  Past Medical History:  Diagnosis Date  . Anxiety   . Anxiety disorder   . Back pain   . Diabetes mellitus type 2 in obese (HCC)    pre-diabetic  . Hypertension 2010  . Knee pain   . Migraine   . Primary osteoarthritis of both knees   . Snoring   . Varicose veins     Past Surgical History:  Procedure Laterality Date  . COLONOSCOPY    . DILATION AND CURETTAGE OF UTERUS     years ago-several  . FRACTURE SURGERY     neck/ 18 years ago  . HIATAL HERNIA REPAIR  09/14/2015   Procedure: LAPAROSCOPIC REPAIR OF HIATAL HERNIA;  Surgeon: Greer Pickerel, MD;  Location: [facility];  Service: General;;  . LAPAROSCOPIC GASTRIC SLEEVE RESECTION N/A 09/14/2015   Procedure: LAPAROSCOPIC GASTRIC SLEEVE RESECTION W/UPPER ENDO;  Surgeon: Greer Pickerel, MD;  Location: [facility];  Service: General;  Laterality: N/A;  . uterine ablation  08/2010    Family History  Problem Relation Age of Onset  . Heart disease Mother   . Cervical cancer Mother   . High blood pressure Mother   . High Cholesterol Mother   . Sudden death Mother   . Other Father   . Diabetes Other        [employer]  . Heart  disease Other        female >64, [facility]  . Pneumonia Sister   . Heart disease Sister   . Healthy Sister   . Colon cancer Neg Hx     SOCIAL HISTORY: Social History   Socioeconomic History  . Marital status: Divorced    Spouse name: 3  . Number of children: Not on file  . Years of education: Not on file  . Highest education level: Not on file  Occupational History  . Not on file  Social Needs  . Financial resource strain: Not on file  . Food insecurity    Worry: Not on file    Inability: Not on file  . Transportation needs    Medical: Not on file    Non-medical: Not on file  Tobacco Use  . Smoking status: Former Smoker    Types: Cigarettes    Quit date: 07/27/2015    Years since quitting: 3.6  . Smokeless tobacco: Never Used  Substance and Sexual Activity  . Alcohol use: Yes    Alcohol/week: 4.0 standard drinks    Types: 4 Glasses of wine per week  . Drug use: No  . Sexual activity: Yes  Lifestyle  . Physical activity    Days per week: Not on  file    Minutes per session: Not on file  . Stress: Not on file  Relationships  . Social Herbalist on phone: Not on file    Gets together: Not on file    Attends religious service: Not on file    Active member of club or organization: Not on file    Attends meetings of clubs or organizations: Not on file    Relationship status: Not on file  . Intimate partner violence    Fear of current or ex partner: Not on file    Emotionally abused: Not on file    Physically abused: Not on file    Forced sexual activity: Not on file  Other Topics Concern  . Not on file  Social History Narrative  . Not on file    No Known Allergies  Current Outpatient Medications  Medication Sig Dispense Refill  . amLODipine (NORVASC) 10 MG tablet TAKE 1 TABLET BY MOUTH EVERY DAY 30 tablet 5  . calcium citrate-vitamin D (CITRACAL+D) 315-200 MG-UNIT tablet Take 1 tablet by mouth 2 (two) times daily.    . cyanocobalamin 500 MCG tablet  Take 500 mcg by mouth daily.    . furosemide (LASIX) 20 MG tablet TAKE 1 TABLET BY MOUTH EVERY DAY 30 tablet 5  . ibuprofen (ADVIL,MOTRIN) 200 MG tablet Take 200 mg by mouth every 6 (six) hours as needed for mild pain.    [name] lovastatin (MEVACOR) 20 MG tablet Take 1 tablet (20 mg total) by mouth at bedtime. 90 tablet 3  . metFORMIN (GLUCOPHAGE) 500 MG tablet TAKE 1 TABLET BY MOUTH EVERY DAY WITH BREAKFAST 90 tablet 3  . Multiple Vitamins-Minerals (CELEBRATE MULTI-COMPLETE 18) CHEW Chew by mouth daily.    . traZODone (DESYREL) 50 MG tablet Take 0.5-1 tablets (25-50 mg total) by mouth at bedtime as needed for sleep. 90 tablet 3  . Vitamin D, Ergocalciferol, (DRISDOL) 50000 units CAPS capsule Take 1 capsule (50,000 Units total) by mouth every 7 (seven) days. 4 capsule 0   No current facility-administered medications for this visit.     REVIEW OF SYSTEMS:  [X]  denotes positive finding, [ ]  denotes negative finding Cardiac  Comments:  Chest pain or chest pressure:    Shortness of breath upon exertion:    Short of breath when lying flat:    Irregular heart rhythm:        Vascular    Pain in calf, thigh, or hip brought on by ambulation:    Pain in feet at night that wakes you up from your sleep:     Blood clot in your veins:    Leg swelling:  x       Pulmonary    Oxygen at home:    Productive cough:     Wheezing:         Neurologic    Sudden weakness in arms or legs:     Sudden numbness in arms or legs:     Sudden onset of difficulty speaking or slurred speech:    Temporary loss of vision in one eye:     Problems with dizziness:         Gastrointestinal    Blood in stool:     Vomited blood:         Genitourinary    Burning when urinating:     Blood in urine:        Psychiatric    Major depression:  Hematologic    Bleeding problems:    Problems with blood clotting too easily:        Skin    Rashes or ulcers:        Constitutional    Fever or chills:       PHYSICAL EXAM: Vitals:   04/02/19 1429  BP: 127/89  Pulse: (!) 101  Resp: 16  Temp: (!) 97.3 F (36.3 C)  TempSrc: Temporal  SpO2: 100%  Weight: 215 lb (97.5 kg)  Height: 5\' 2"  (1.575 m)    GENERAL: The patient is a well-nourished female, in no acute distress. The vital signs are documented above. CARDIAC: There is a regular rate and rhythm.  VASCULAR:  2+ palpable radial pulse bilateral 2+ palpable femoral pulse bilateral 2+ palpable dorsalis pedis pulse bilateral Varicosities painfully tender on the right leg at the level of the knee Several spider veins on right lateral calf PULMONARY: There is good air exchange bilaterally without wheezing or rales. ABDOMEN: Soft and non-tender with normal pitched bowel sounds.  MUSCULOSKELETAL: There are no major deformities or cyanosis. NEUROLOGIC: No focal weakness or paresthesias are detected.   DATA:   I independently reviewed her reflux study and she has abnormal reflux times in the right common femoral vein and saphenofemoral junction and the left common femoral vein.  Assessment/Plan:  63 year old female presents with bilateral lower extremity venous insufficiency.  In review of her venous duplex study most of her reflux is in the deep system and discussed with the patient this is typically not amenable to intervention from a surgical standpoint.  Recommend conservative measures with compression and leg elevation.  I would convert her to thigh-high compression instead of knee-high compression with 20-30 mm Hg.  We did measure her for stockings today and she is going to go to the elastic therapy warehouse in [place] to get thigh-high compression.  In regards to this patch of varicosities on her right lateral knee that is painful discussed that certainly could see if this improves with thigh-high compression and if not this may be amendable to sclerotherapy.  Discussed that we could arrange sclerotherapy here in the office with one of  our sclerotherapy nurses to have this injected.  Patient will call to arrange if she decides to proceed.   Marty Heck, MD [facility] Office: [phone] Pager: [phone]

## 2019-04-02 NOTE — Telephone Encounter (Signed)
Patient has appointment today with vasucular surgery. She has been waiting for 2 months and does not wish to cancel. However she is to go get tested for covid Please advise on what patient is to do. I do not think they will allow her in with these symptoms.

## 2019-04-03 ENCOUNTER — Ambulatory Visit: Payer: BC Managed Care – PPO | Admitting: Family Medicine

## 2019-04-03 ENCOUNTER — Other Ambulatory Visit: Payer: Self-pay

## 2019-04-03 DIAGNOSIS — Z20822 Contact with and (suspected) exposure to covid-19: Secondary | ICD-10-CM

## 2019-04-03 NOTE — Progress Notes (Signed)
[id number] 

## 2019-04-05 ENCOUNTER — Encounter: Payer: Self-pay | Admitting: Family Medicine

## 2019-04-05 LAB — NOVEL CORONAVIRUS, NAA: SARS-CoV-2, NAA: NOT DETECTED

## 2019-04-11 ENCOUNTER — Encounter: Payer: Self-pay | Admitting: Family Medicine

## 2019-04-11 ENCOUNTER — Other Ambulatory Visit: Payer: Self-pay

## 2019-04-11 ENCOUNTER — Ambulatory Visit (INDEPENDENT_AMBULATORY_CARE_PROVIDER_SITE_OTHER): Payer: BC Managed Care – PPO | Admitting: Family Medicine

## 2019-04-11 DIAGNOSIS — F4323 Adjustment disorder with mixed anxiety and depressed mood: Secondary | ICD-10-CM

## 2019-04-11 MED ORDER — VENLAFAXINE HCL ER 75 MG PO CP24: 75.0000 mg | ORAL_CAPSULE | Freq: Every day | ORAL | 3 refills | Status: DC

## 2019-04-11 NOTE — Progress Notes (Signed)
[place] at [facility] [address] [place], [place] [address] [phone]  Date:  04/11/2019   Name:  LATIARA Bell   DOB:  1956-02-21   MRN:  [id number]  PCP:  Darreld Mclean, MD    Chief Complaint: No chief complaint on file.   History of Present Illness:  Misty Bell is a 63 y.o. very pleasant female patient who presents with the following:  Here today to discuss FMLA paperwork- virtual visit  Patient with history of controlled diabetes, hypertension, obesity status post lap band She had contacted me recently about difficulty with her work situation-she has been suffering from anxiety and feeling quite overwhelmed.  Having more headaches than usual She had a negative COVID-19 screening on October 7  Today she notes that she is feeling "listless and anxious"- she is not sure where this is coming from, but thinks that perhaps recent events have just caught up with her. Her daughter gave birth to her 6th child- a granddaughter. Both are doing well  She works at [employer]; she is a Civil engineer, contracting.  Most of her co-workers are working virtually right now She is going in some to the office and also working from home some of the time She is still grieving the loss of her son- he died 72 years ago now.  This time of year is the anniversary of his death, and for some reason it has hit her especially hard  She has not been on any medications for depression or anxiety except for trazodone  She feels like she is both depressed and anxious, about the same No SI  She is not eating quite as well as normal- she only eats when she gets really hungry and her stomach hurts   She request to take a leave of absence from her job, as her symptoms prevented her from concentrating and doing her work efficiently.  Also, the stress of work seems to be increasing her symptoms.  She would like to begin treatment with medication for  anxiety and depression  Lab Results  Component Value Date   HGBA1C 6.9 (H) 03/14/2019   Amlodipine 10 Lasix 20 Lovastatin Metformin Trazodone at bedtime for sleep- she is taking this and she does seem to sleep well though the night She is now taking a whole tablet   Patient Active Problem List   Diagnosis Date Noted  . Chronic venous insufficiency 04/02/2019  . Other fatigue 09/25/2017  . Shortness of breath on exertion 09/25/2017  . Status post gastric banding 09/25/2017  . Hypercholesterolemia 09/14/2015  . Obesity, Class III, BMI 40-49.9 (morbid obesity) ([place]) 09/14/2015  . Hiatal hernia 09/14/2015  . Osteoarthritis of both knees 09/14/2015  . DOE (dyspnea on exertion) 09/02/2015  . Anxiety disorder 04/01/2015  . Back pain 04/01/2015  . Diabetes mellitus type 2 in obese ([place]) 04/01/2015  . Essential hypertension 04/01/2015  . Migraine 04/01/2015  . Spider nevus of skin 10/29/2012    Past Medical History:  Diagnosis Date  . Anxiety   . Anxiety disorder   . Back pain   . Diabetes mellitus type 2 in obese (HCC)    pre-diabetic  . Hypertension 2010  . Knee pain   . Migraine   . Primary osteoarthritis of both knees   . Snoring   . Varicose veins     Past Surgical History:  Procedure Laterality Date  . COLONOSCOPY    . DILATION  AND CURETTAGE OF UTERUS     years ago-several  . FRACTURE SURGERY     neck/ 18 years ago  . HIATAL HERNIA REPAIR  09/14/2015   Procedure: LAPAROSCOPIC REPAIR OF HIATAL HERNIA;  Surgeon: Greer Pickerel, MD;  [facility]: [facility];  Service: General;;  . LAPAROSCOPIC GASTRIC SLEEVE RESECTION N/A 09/14/2015   Procedure: LAPAROSCOPIC GASTRIC SLEEVE RESECTION W/UPPER ENDO;  Surgeon: Greer Pickerel, MD;  Location: [facility];  Service: General;  Laterality: N/A;  . uterine ablation  08/2010    Social History   Tobacco Use  . Smoking status: Former Smoker    Types: Cigarettes    Quit date: 07/27/2015    Years since quitting: 3.7  . Smokeless tobacco:  Never Used  Substance Use Topics  . Alcohol use: Yes    Alcohol/week: 4.0 standard drinks    Types: 4 Glasses of wine per week  . Drug use: No    Family History  Problem Relation Age of Onset  . Heart disease Mother   . Cervical cancer Mother   . High blood pressure Mother   . High Cholesterol Mother   . Sudden death Mother   . Other Father   . Diabetes Other        [place]  . Heart disease Other        female >60, [facility]  . Pneumonia Sister   . Heart disease Sister   . Healthy Sister   . Colon cancer Neg Hx     No Known Allergies  Medication list has been reviewed and updated.  Current Outpatient Medications on File Prior to Visit  Medication Sig Dispense Refill  . amLODipine (NORVASC) 10 MG tablet TAKE 1 TABLET BY MOUTH EVERY DAY 30 tablet 5  . calcium citrate-vitamin D (CITRACAL+D) 315-200 MG-UNIT tablet Take 1 tablet by mouth 2 (two) times daily.    . cyanocobalamin 500 MCG tablet Take 500 mcg by mouth daily.    . furosemide (LASIX) 20 MG tablet TAKE 1 TABLET BY MOUTH EVERY DAY 30 tablet 5  . ibuprofen (ADVIL,MOTRIN) 200 MG tablet Take 200 mg by mouth every 6 (six) hours as needed for mild pain.    [name] lovastatin (MEVACOR) 20 MG tablet Take 1 tablet (20 mg total) by mouth at bedtime. 90 tablet 3  . metFORMIN (GLUCOPHAGE) 500 MG tablet TAKE 1 TABLET BY MOUTH EVERY DAY WITH BREAKFAST 90 tablet 3  . Multiple Vitamins-Minerals (CELEBRATE MULTI-COMPLETE 18) CHEW Chew by mouth daily.    . traZODone (DESYREL) 50 MG tablet Take 0.5-1 tablets (25-50 mg total) by mouth at bedtime as needed for sleep. 90 tablet 3  . Vitamin D, Ergocalciferol, (DRISDOL) 50000 units CAPS capsule Take 1 capsule (50,000 Units total) by mouth every 7 (seven) days. 4 capsule 0   No current facility-administered medications on file prior to visit.     Review of Systems:  As per HPI- otherwise negative.   Physical Examination: There were no vitals filed for this visit. There were no vitals filed for this  visit. There is no height or weight on file to calculate BMI. Ideal Body Weight:    BP at home 142/91. Pulse 91 No fever noted  Pt seen over video- looks well.   No cough, SOB, distress noted today   Assessment and Plan: Adjustment disorder with mixed anxiety and depressed mood - Plan: venlafaxine XR (EFFEXOR XR) 75 MG 24 hr capsule  Virtual visit today to discuss an adjustment disorder.  Patient has been under  a lot of stress at work.  Her son died this time of year 12 years ago, and her grief is hitting her especially hard this time. She notes that she never took time to properly grieve, as she was too busy try to keep the rest of the family going when her son died  She is interested in grief counseling,mentioned hospice and I also suggested that compassionate Friends network for support esp for bereaved parents   She would like to start treatment for depression and anxiety.  We will start her on Effexor XR 75.  I have explained the increased risk of serotonin syndrome in combination with her trazodone.  She will decrease trazodone to 25 mg at bedtime, I will include a description of serotonin syndrome symptoms in her wrap-up notes  She plans to see me in about 2 weeks for an in person or virtual follow-up visit  Signed Lamar Blinks, MD

## 2019-04-11 NOTE — Patient Instructions (Signed)
It was very good to talk with you today, but I am sorry that you are having a hard time  As a reminder, we plan to start you on venlafaxine (Effexor) extended release once a day.  You can take this anytime of day that suits you.  When you start this medication, please decrease your trazodone to a half tablet at bedtime  For support after child loss, you might want to check into [facility] -this is a group especially for bereaved parents which can be very helpful [email or web address].org/  Hospice and palliative care services also often provide grief counseling services  As we discussed, there can be increased risk of serotonin syndrome when taking Effexor and trazodone together.  This is a rare disorder and I do not expect any problems.  However, please be alert for symptoms which may include the following:  Agitation or restlessness Confusion Rapid heart rate and high blood pressure Dilated pupils Loss of muscle coordination or twitching muscles Muscle rigidity Heavy sweating Diarrhea Headache Shivering Goose bumps Severe serotonin syndrome can be life-threatening. Signs include:  High fever Seizures Irregular heartbeat Unconsciousness  If you experience any of these symptoms, please stop both medications and contact me right away  Discussed, please plan to follow-up with me in about 2 weeks either in person or virtually to see how you are doing

## 2019-04-15 ENCOUNTER — Ambulatory Visit: Payer: BC Managed Care – PPO | Admitting: Family Medicine

## 2019-04-29 ENCOUNTER — Encounter: Payer: Self-pay | Admitting: Family Medicine

## 2019-05-06 ENCOUNTER — Other Ambulatory Visit: Payer: Self-pay | Admitting: Family Medicine

## 2019-05-06 DIAGNOSIS — F4323 Adjustment disorder with mixed anxiety and depressed mood: Secondary | ICD-10-CM

## 2019-05-16 ENCOUNTER — Encounter: Payer: Self-pay | Admitting: Family Medicine

## 2019-05-21 ENCOUNTER — Other Ambulatory Visit: Payer: Self-pay | Admitting: Family Medicine

## 2019-06-11 ENCOUNTER — Encounter: Payer: Self-pay | Admitting: Family Medicine

## 2019-08-22 ENCOUNTER — Encounter: Payer: Self-pay | Admitting: Family Medicine

## 2019-08-26 ENCOUNTER — Encounter: Payer: Self-pay | Admitting: Family Medicine

## 2019-09-11 ENCOUNTER — Other Ambulatory Visit: Payer: Self-pay | Admitting: Family Medicine

## 2019-09-12 ENCOUNTER — Other Ambulatory Visit: Payer: Self-pay | Admitting: Family Medicine

## 2019-09-25 ENCOUNTER — Encounter: Payer: Self-pay | Admitting: Family Medicine

## 2019-10-06 NOTE — Progress Notes (Signed)
[place] at [facility] [address] [place], [place] [address] [phone] [id number] [phone]  Date:  10/07/2019   Name:  Misty Bell   DOB:  1956-06-06   MRN:  [id number]  PCP:  Darreld Mclean, MD    Chief Complaint: No chief complaint on file.   History of Present Illness:  Misty Bell is a 64 y.o. very pleasant female patient who presents with the following:  Video visit today to discuss returning to work after a leave of absence, and also COVID-19 immunization Connected with pt via video today Pt and myself are on the call today Pt ID confirmed with 2 factors, she gives consent for virtual visit today  Patient with history of hypertension, diabetes, hyperlipidemia, obesity She had recently been struggling with adjustment disorder, has been out of work for a few weeks  She plans to RTW tomorrow- she needs a note allowing her to return She notes that she is is much better than she was previously She is doing counseling and also group therapy- this has been helping her a lot  Her medication was also changed from venlafaxine to sertraline She notes that she sleeps well as long as she is taking her sertraline Her dose is 50mg  and she takes 1/2 She is using her trazodone as well   She does not check her glucose at home Her home BP has been about 128/70  BP Readings from Last 3 Encounters:  04/02/19 127/89  03/14/19 118/80  02/06/19 140/73   Lab Results  Component Value Date   HGBA1C 6.9 (H) 03/14/2019  she has very dry and itchy skin on her right palm- this has been present for over 2 weeks She has tried OTC moisturizers but they do not seem to be working.  She is not sure what caused this No other skin outbreaks are present  Patient Active Problem List   Diagnosis Date Noted  . Chronic venous insufficiency 04/02/2019  . Other fatigue 09/25/2017  . Shortness of breath on exertion 09/25/2017  . Status post gastric  banding 09/25/2017  . Hypercholesterolemia 09/14/2015  . Obesity, Class III, BMI 40-49.9 (morbid obesity) ([place]) 09/14/2015  . Hiatal hernia 09/14/2015  . Osteoarthritis of both knees 09/14/2015  . DOE (dyspnea on exertion) 09/02/2015  . Anxiety disorder 04/01/2015  . Back pain 04/01/2015  . Diabetes mellitus type 2 in obese ([place]) 04/01/2015  . Essential hypertension 04/01/2015  . Migraine 04/01/2015  . Spider nevus of skin 10/29/2012    Past Medical History:  Diagnosis Date  . Anxiety   . Anxiety disorder   . Back pain   . Diabetes mellitus type 2 in obese (HCC)    pre-diabetic  . Hypertension 2010  . Knee pain   . Migraine   . Primary osteoarthritis of both knees   . Snoring   . Varicose veins     Past Surgical History:  Procedure Laterality Date  . COLONOSCOPY    . DILATION AND CURETTAGE OF UTERUS     years ago-several  . FRACTURE SURGERY     neck/ 18 years ago  . HIATAL HERNIA REPAIR  09/14/2015   Procedure: LAPAROSCOPIC REPAIR OF HIATAL HERNIA;  Surgeon: Greer Pickerel, MD;  [facility]: [facility];  Service: General;;  . LAPAROSCOPIC GASTRIC SLEEVE RESECTION N/A 09/14/2015   Procedure: LAPAROSCOPIC GASTRIC SLEEVE RESECTION W/UPPER ENDO;  Surgeon: Greer Pickerel, MD;  Location: [facility];  Service: General;  Laterality: N/A;  .  uterine ablation  08/2010    Social History   Tobacco Use  . Smoking status: Former Smoker    Types: Cigarettes    Quit date: 07/27/2015    Years since quitting: 4.2  . Smokeless tobacco: Never Used  Substance Use Topics  . Alcohol use: Yes    Alcohol/week: 4.0 standard drinks    Types: 4 Glasses of wine per week  . Drug use: No    Family History  Problem Relation Age of Onset  . Heart disease Mother   . Cervical cancer Mother   . High blood pressure Mother   . High Cholesterol Mother   . Sudden death Mother   . Other Father   . Diabetes Other        [place]  . Heart disease Other        female >21, [facility]  . Pneumonia Sister   . Heart disease  Sister   . Healthy Sister   . Colon cancer Neg Hx     No Known Allergies  Medication list has been reviewed and updated.  Current Outpatient Medications on File Prior to Visit  Medication Sig Dispense Refill  . amLODipine (NORVASC) 10 MG tablet TAKE 1 TABLET BY MOUTH EVERY DAY 30 tablet 5  . calcium citrate-vitamin D (CITRACAL+D) 315-200 MG-UNIT tablet Take 1 tablet by mouth 2 (two) times daily.    . cyanocobalamin 500 MCG tablet Take 500 mcg by mouth daily.    . furosemide (LASIX) 20 MG tablet TAKE 1 TABLET BY MOUTH EVERY DAY 30 tablet 5  . ibuprofen (ADVIL,MOTRIN) 200 MG tablet Take 200 mg by mouth every 6 (six) hours as needed for mild pain.    [name] lovastatin (MEVACOR) 20 MG tablet Take 1 tablet (20 mg total) by mouth at bedtime. 90 tablet 3  . metFORMIN (GLUCOPHAGE) 500 MG tablet TAKE 1 TABLET BY MOUTH EVERY DAY WITH BREAKFAST 90 tablet 3  . Multiple Vitamins-Minerals (CELEBRATE MULTI-COMPLETE 18) CHEW Chew by mouth daily.    . traZODone (DESYREL) 50 MG tablet Take 0.5-1 tablets (25-50 mg total) by mouth at bedtime as needed for sleep. 90 tablet 3  . venlafaxine XR (EFFEXOR-XR) 75 MG 24 hr capsule TAKE 1 CAPSULE BY MOUTH DAILY WITH BREAKFAST 90 capsule 2  . Vitamin D, Ergocalciferol, (DRISDOL) 50000 units CAPS capsule Take 1 capsule (50,000 Units total) by mouth every 7 (seven) days. 4 capsule 0   No current facility-administered medications on file prior to visit.    Review of Systems:  As per HPI- otherwise negative.   Physical Examination: There were no vitals filed for this visit. There were no vitals filed for this visit. There is no height or weight on file to calculate BMI. Ideal Body Weight:    Pt observed over video  She looks well No cough, wheezing, distress is noted Her right palm displays a scaly dry lesion consistent with dyshidrotic eczema Assessment and Plan: Adjustment disorder with mixed anxiety and depressed mood  Dyshidrotic eczema - Plan:  fluocinonide ointment (LIDEX) 0.05 %  Here today for follow-up visit. Her adjustment disorder symptoms are much improved with counseling and medication change.  She is now ready to return to work.  I will fax a work release note to her job Called in fluocinonide for her to use as needed for her palm rash This visit occurred during the SARS-CoV-2 public health emergency.  Safety protocols were in place, including screening questions prior to the visit, additional usage of staff PPE,  and extensive cleaning of exam room while observing appropriate contact time as indicated for disinfecting solutions.  She will schedule a CPE with me soon  Signed Lamar Blinks, MD Need to fax work release to [employer] [phone]

## 2019-10-07 ENCOUNTER — Telehealth (INDEPENDENT_AMBULATORY_CARE_PROVIDER_SITE_OTHER): Payer: BC Managed Care – PPO | Admitting: Family Medicine

## 2019-10-07 ENCOUNTER — Encounter: Payer: Self-pay | Admitting: Family Medicine

## 2019-10-07 ENCOUNTER — Other Ambulatory Visit: Payer: Self-pay

## 2019-10-07 DIAGNOSIS — F4323 Adjustment disorder with mixed anxiety and depressed mood: Secondary | ICD-10-CM | POA: Diagnosis not present

## 2019-10-07 DIAGNOSIS — L301 Dyshidrosis [pompholyx]: Secondary | ICD-10-CM | POA: Diagnosis not present

## 2019-10-07 MED ORDER — SERTRALINE HCL 50 MG PO TABS: 50.0000 mg | ORAL_TABLET | Freq: Every day | ORAL | 3 refills | Status: DC

## 2019-10-07 MED ORDER — FLUOCINONIDE 0.05 % EX OINT: 1.0000 "application " | TOPICAL_OINTMENT | Freq: Two times a day (BID) | CUTANEOUS | 1 refills | Status: DC

## 2019-12-03 ENCOUNTER — Encounter: Payer: Self-pay | Admitting: Family Medicine

## 2019-12-04 ENCOUNTER — Encounter: Payer: Self-pay | Admitting: Obstetrics & Gynecology

## 2019-12-04 ENCOUNTER — Ambulatory Visit (HOSPITAL_BASED_OUTPATIENT_CLINIC_OR_DEPARTMENT_OTHER): Payer: BC Managed Care – PPO | Admitting: Obstetrics & Gynecology

## 2019-12-04 ENCOUNTER — Other Ambulatory Visit (HOSPITAL_COMMUNITY)
Admission: RE | Admit: 2019-12-04 | Discharge: 2019-12-04 | Disposition: A | Discharge status: Home / Self Care | Payer: BC Managed Care – PPO | Source: Ambulatory Visit | Attending: Obstetrics & Gynecology | Admitting: Obstetrics & Gynecology

## 2019-12-04 ENCOUNTER — Other Ambulatory Visit: Payer: Self-pay

## 2019-12-04 VITALS — BP 132/76 | HR 98 | Wt 207.0 lb

## 2019-12-04 DIAGNOSIS — Z01419 Encounter for gynecological examination (general) (routine) without abnormal findings: Secondary | ICD-10-CM | POA: Diagnosis not present

## 2019-12-04 DIAGNOSIS — Z1231 Encounter for screening mammogram for malignant neoplasm of breast: Secondary | ICD-10-CM

## 2019-12-04 NOTE — Progress Notes (Signed)
Subjective:     Misty Bell is a 64 y.o. female here for a routine exam.  Current complaints: Pt report no problems. She reports that she and some family and friends are [place] to have 'tummy tucks'. She is about to begin a masters in [place]. She denies bleeding or GYN problems.      Gynecologic History No LMP recorded. Patient has had an ablation. Contraception: post menopausal status Last Pap: 2018. Results were: normal Last mammogram: 12/24/2018. Results were: normal  Obstetric History OB History  Gravida Para Term Preterm AB Living  4 3          SAB TAB Ectopic Multiple Live Births          3    # Outcome Date GA Lbr Len/2nd Weight Sex Delivery Anes PTL Lv  4 Gravida           3 Para      Vag-Spont     2 Para      Vag-Spont     1 Para      Vag-Spont        The following portions of the patient's history were reviewed and updated as appropriate: allergies, current medications, past family history, past medical history, past social history, past surgical history and problem list.  Review of Systems Pertinent items are noted in HPI.    Objective:  BP 132/76   Pulse 98   Wt 207 lb (93.9 kg)   BMI 37.86 kg/m  General Appearance:    Alert, cooperative, no distress, appears stated age  Head:    Normocephalic, without obvious abnormality, atraumatic  Eyes:    conjunctiva/corneas clear, EOM's intact, both eyes  Ears:    Normal external ear canals, both ears  Nose:   Nares normal, septum midline, mucosa normal, no drainage    or sinus tenderness  Throat:   Lips, mucosa, and tongue normal; teeth and gums normal  Neck:   Supple, symmetrical, trachea midline, no adenopathy;    thyroid:  no enlargement/tenderness/nodules  Back:     Symmetric, no curvature, ROM normal, no CVA tenderness  Lungs:     respirations unlabored  Chest Wall:    No tenderness or deformity   Heart:    Regular rate and rhythm  Breast Exam:    No tenderness, masses, or nipple  abnormality  Abdomen:     Soft, non-tender, bowel sounds active all four quadrants,    no masses, no organomegaly  Genitalia:    Normal female without lesion, discharge or tenderness     Extremities:   Extremities normal, atraumatic, no cyanosis or edema  Pulses:   2+ and symmetric all extremities  Skin:   Skin color, texture, turgor normal, no rashes or lesions      Assessment:    Healthy female exam.   Pt is UTD on all health maintainence     Plan:   F/u PAP Screening mammogram this month F/u in 1 year or sooner prn  [name] L. Harraway-Smith, M.D., [name]

## 2019-12-06 LAB — CYTOLOGY - PAP
Comment: NEGATIVE
Diagnosis: NEGATIVE
High risk HPV: NEGATIVE

## 2019-12-09 NOTE — Patient Instructions (Addendum)
It was good to see you today- I will be in touch with your labs asap Mitchem stick is one of the strongest types you can get OTC  Please work on quitting smoking again We will see if you are immune to chicken pox!    Health Maintenance, Female Adopting a healthy lifestyle and getting preventive care are important in promoting health and wellness. Ask your health care provider about:  The right schedule for you to have regular tests and exams.  Things you can do on your own to prevent diseases and keep yourself healthy. What should I know about diet, weight, and exercise? Eat a healthy diet   Eat a diet that includes plenty of vegetables, fruits, low-fat dairy products, and lean protein.  Do not eat a lot of foods that are high in solid fats, added sugars, or sodium. Maintain a healthy weight Body mass index (BMI) is used to identify weight problems. It estimates body fat based on height and weight. Your health care provider can help determine your BMI and help you achieve or maintain a healthy weight. Get regular exercise Get regular exercise. This is one of the most important things you can do for your health. Most adults should:  Exercise for at least 150 minutes each week. The exercise should increase your heart rate and make you sweat (moderate-intensity exercise).  Do strengthening exercises at least twice a week. This is in addition to the moderate-intensity exercise.  Spend less time sitting. Even light physical activity can be beneficial. Watch cholesterol and blood lipids Have your blood tested for lipids and cholesterol at 62 years of age, then have this test every 5 years. Have your cholesterol levels checked more often if:  Your lipid or cholesterol levels are high.  You are older than 62 years of age.  You are at high risk for heart disease. What should I know about cancer screening? Depending on your health history and family history, you may need to have cancer  screening at various ages. This may include screening for:  Breast cancer.  Cervical cancer.  Colorectal cancer.  Skin cancer.  Lung cancer. What should I know about heart disease, diabetes, and high blood pressure? Blood pressure and heart disease  High blood pressure causes heart disease and increases the risk of stroke. This is more likely to develop in people who have high blood pressure readings, are of African descent, or are overweight.  Have your blood pressure checked: ? Every 3-5 years if you are 62-30 years of age. ? Every year if you are 62 years old or older. Diabetes Have regular diabetes screenings. This checks your fasting blood sugar level. Have the screening done:  Once every three years after age 62 if you are at a normal weight and have a low risk for diabetes.  More often and at a younger age if you are overweight or have a high risk for diabetes. What should I know about preventing infection? Hepatitis B If you have a higher risk for hepatitis B, you should be screened for this virus. Talk with your health care provider to find out if you are at risk for hepatitis B infection. Hepatitis C Testing is recommended for:  Everyone born from [birth year] through 1963.  Anyone with known risk factors for hepatitis C. Sexually transmitted infections (STIs)  Get screened for STIs, including gonorrhea and chlamydia, if: ? You are sexually active and are younger than 62 years of age. ? You are older than  62 years of age and your health care provider tells you that you are at risk for this type of infection. ? Your sexual activity has changed since you were last screened, and you are at increased risk for chlamydia or gonorrhea. Ask your health care provider if you are at risk.  Ask your health care provider about whether you are at high risk for HIV. Your health care provider may recommend a prescription medicine to help prevent HIV infection. If you choose to take  medicine to prevent HIV, you should first get tested for HIV. You should then be tested every 3 months for as long as you are taking the medicine. Pregnancy  If you are about to stop having your period (premenopausal) and you may become pregnant, seek counseling before you get pregnant.  Take 400 to 800 micrograms (mcg) of folic acid every day if you become pregnant.  Ask for birth control (contraception) if you want to prevent pregnancy. Osteoporosis and menopause Osteoporosis is a disease in which the bones lose minerals and strength with aging. This can result in bone fractures. If you are 59 years old or older, or if you are at risk for osteoporosis and fractures, ask your health care provider if you should:  Be screened for bone loss.  Take a calcium or vitamin D supplement to lower your risk of fractures.  Be given hormone replacement therapy (HRT) to treat symptoms of menopause. Follow these instructions at home: Lifestyle  Do not use any products that contain nicotine or tobacco, such as cigarettes, e-cigarettes, and chewing tobacco. If you need help quitting, ask your health care provider.  Do not use street drugs.  Do not share needles.  Ask your health care provider for help if you need support or information about quitting drugs. Alcohol use  Do not drink alcohol if: ? Your health care provider tells you not to drink. ? You are pregnant, may be pregnant, or are planning to become pregnant.  If you drink alcohol: ? Limit how much you use to 0-1 drink a day. ? Limit intake if you are breastfeeding.  Be aware of how much alcohol is in your drink. In the [place] one drink equals one 12 oz bottle of beer (355 mL), one 5 oz glass of wine (148 mL), or one 1 oz glass of hard liquor (44 mL). General instructions  Schedule regular health, dental, and eye exams.  Stay current with your vaccines.  Tell your health care provider if: ? You often feel depressed. ? You have  ever been abused or do not feel safe at home. Summary  Adopting a healthy lifestyle and getting preventive care are important in promoting health and wellness.  Follow your health care provider's instructions about healthy diet, exercising, and getting tested or screened for diseases.  Follow your health care provider's instructions on monitoring your cholesterol and blood pressure. This information is not intended to replace advice given to you by your health care provider. Make sure you discuss any questions you have with your health care provider. Document Revised: 06/06/2018 Document Reviewed: 06/06/2018 Elsevier Patient Education  2020 [employer].

## 2019-12-09 NOTE — Progress Notes (Addendum)
[facility] [address], [place], [place] [address] [phone]  Date:  12/12/2019   Name:  Misty Bell   DOB:  05/05/1956   MRN:  [id number]  PCP:  Darreld Mclean, MD    Chief Complaint: Annual Exam (mammogram today? )   History of Present Illness:  Misty Bell is a 64 y.o. very pleasant female patient who presents with the following:  Here today for a routine CPE History of DM, HTN, migraine HA, OA knees, hyperlipidemia, obesity s/p lap band  Lab Results  Component Value Date   HGBA1C 6.9 (H) 03/14/2019   Last seen by myself in April of this year - virtual- to discuss RTW after a leave of absence for adjustment disorder  She has decided to go ahead and retire at this point.  She has mixed feelings about this. 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   Foot exam due Pap UTD Colon UTD mammo scheduled for later this month  shingrix-she is not certain that chickenpox history She saw GYN earlier this month   She would like to get more exercise, but has been off the wagon She smoked for about 40 years off and on.  Never smoked a pack per day- generally would go through a pack a week.   She has started smoking again a little bit   Pt has noted an ache in her right arm and right leg for about 4 months now She remembered this at the end of her visit and came back from the lab to discuss with me She is not aware of any injury.  She seems to have some tenderness in the right biceps tendon, she notes she has not been exercising or otherwise stressing the elbow as far she is aware  Patient Active Problem List   Diagnosis Date Noted  . Chronic venous insufficiency 04/02/2019  . Other  fatigue 09/25/2017  . Shortness of breath on exertion 09/25/2017  . Status post gastric banding 09/25/2017  . Hypercholesterolemia 09/14/2015  . Obesity, Class III, BMI 40-49.9 (morbid obesity) ([place]) 09/14/2015  . Hiatal hernia 09/14/2015  . Osteoarthritis of both knees 09/14/2015  . DOE (dyspnea on exertion) 09/02/2015  . Anxiety disorder 04/01/2015  . Back pain 04/01/2015  . Diabetes mellitus type 2 in obese ([place]) 04/01/2015  . Essential hypertension 04/01/2015  . Migraine 04/01/2015  . Spider nevus of skin 10/29/2012    Past Medical History:  Diagnosis Date  . Anxiety   . Anxiety disorder   . Back pain   . Diabetes mellitus type 2 in obese ([facility])    pre-diabetic  . Hypertension 2010  . Knee pain   . Migraine   . Primary osteoarthritis of both knees   . Snoring   . Varicose veins     Past Surgical History:  Procedure Laterality Date  . COLONOSCOPY    . DILATION AND CURETTAGE OF UTERUS     years ago-several  . FRACTURE SURGERY     neck/ 18 years ago  . HIATAL HERNIA REPAIR  09/14/2015   Procedure: LAPAROSCOPIC REPAIR OF HIATAL HERNIA;  Surgeon: Greer Pickerel, MD;  Location: [facility];  Service: General;;  . LAPAROSCOPIC GASTRIC SLEEVE RESECTION N/A 09/14/2015   Procedure: LAPAROSCOPIC GASTRIC SLEEVE RESECTION W/UPPER ENDO;  Surgeon: Greer Pickerel, MD;  Location: [facility];  Service: General;  Laterality: N/A;  . uterine ablation  08/2010    Social History   Tobacco Use  . Smoking status: Former Smoker    Types: Cigarettes    Quit date: 07/27/2015    Years since quitting: 4.3  . Smokeless tobacco: Never Used  Substance Use Topics  . Alcohol use: Yes    Alcohol/week: 4.0 standard drinks    Types: 4 Glasses of wine per week  . Drug use: No    Family History  Problem Relation Age of Onset  . Heart disease Mother   . Cervical cancer Mother   . High blood pressure Mother   . High Cholesterol Mother   . Sudden death Mother   . Other Father   . Diabetes Other         [place]  . Heart disease Other        female >18, [facility]  . Pneumonia Sister   . Heart disease Sister   . Healthy Sister   . Colon cancer Neg Hx     No Known Allergies  Medication list has been reviewed and updated.  Current Outpatient Medications on File Prior to Visit  Medication Sig Dispense Refill  . amLODipine (NORVASC) 10 MG tablet TAKE 1 TABLET BY MOUTH EVERY DAY 30 tablet 5  . calcium citrate-vitamin D (CITRACAL+D) 315-200 MG-UNIT tablet Take 1 tablet by mouth 2 (two) times daily.    . cyanocobalamin 500 MCG tablet Take 500 mcg by mouth daily.    . fluocinonide ointment (LIDEX) 3.57 % Apply 1 application topically 2 (two) times daily. Use as needed for palm. 30 g 1  . furosemide (LASIX) 20 MG tablet TAKE 1 TABLET BY MOUTH EVERY DAY 30 tablet 5  . ibuprofen (ADVIL,MOTRIN) 200 MG tablet Take 200 mg by mouth every 6 (six) hours as needed for mild pain.    . Multiple Vitamins-Minerals (CELEBRATE MULTI-COMPLETE 18) CHEW Chew by mouth daily.    . sertraline (ZOLOFT) 50 MG tablet Take 1 tablet (50 mg total) by mouth daily. 30 tablet 3  . traZODone (DESYREL) 50 MG tablet Take 0.5-1 tablets (25-50 mg total) by mouth at bedtime as needed for sleep. 90 tablet 3  . lovastatin (MEVACOR) 20 MG tablet Take 1 tablet (20 mg total) by mouth at bedtime. (Patient not taking: Reported on 12/04/2019) 90 tablet 3  . metFORMIN (GLUCOPHAGE) 500 MG tablet TAKE 1 TABLET BY MOUTH EVERY DAY WITH BREAKFAST (Patient not taking: Reported on 12/04/2019) 90 tablet 3   No current facility-administered medications on file prior to visit.    Review of Systems:  As per HPI- otherwise negative.   Physical Examination: Vitals:   12/12/19 1124  BP: 126/82  Pulse: (!) 102  Resp: 17  Temp: (!) 96.8 F (36 C)  SpO2: 95%   Vitals:   12/12/19 1124  Weight: 205 lb (93 kg)  Height: 5\' 2"  (1.575 m)   Body mass index is 37.49 kg/m. Ideal Body Weight: Weight in (lb) to have BMI = 25: 136.4  GEN: no acute distress.   Obese, otherwise looks well HEENT: Atraumatic, Normocephalic.   Bilateral TM wnl, oropharynx normal.  PEERL,EOMI.   Ears and Nose: No external deformity. CV: RRR, No M/G/R. No JVD. No thrill. No extra heart sounds. PULM: CTA B, no wheezes, crackles,  rhonchi. No retractions. No resp. distress. No accessory muscle use. ABD: S, NT, ND, +BS. No rebound. No HSM. EXTR: No c/c/e PSYCH: Normally interactive. Conversant.  Foot exam normal today  Right arm exam is basically normal.  She has normal range of motion of her elbow in all directions.  She notes minor tenderness of the biceps muscle in the right biceps tendon.  Normal strength at the biceps Assessment and Plan: Physical exam  Controlled type 2 diabetes mellitus without complication, without long-term current use of insulin ([place]) - Plan: Comprehensive metabolic panel, Hemoglobin A1c, Microalbumin / creatinine urine ratio  Dyslipidemia - Plan: Lipid panel  Essential hypertension - Plan: CBC, Comprehensive metabolic panel  Screening for thyroid disorder - Plan: TSH  Vitamin D deficiency - Plan: VITAMIN D 25 Hydroxy (Vit-D Deficiency, Fractures)  Exposure to varicella - Plan: Varicella zoster antibody, IgG  Right arm pain - Plan: Ambulatory referral to Orthopedic Surgery  Here today for physical exam.  Labs are pending as above.  She admits she has not been taking her cholesterol and diabetes medications regularly, we will check on her lab status Blood pressure under okay control Health maintenance discussed as above.  She is not sure about chickenpox history, will check varicella IgG She has been a light smoker for many years, but has not smoked enough to qualify for lung cancer screening CT Encouraged her to quit smoking and get more exercise Referral to orthopedics to discuss likely right biceps tendinitis Will plan further follow- up pending labs.  This visit occurred during the SARS-CoV-2 public health emergency.  Safety protocols  were in place, including screening questions prior to the visit, additional usage of staff PPE, and extensive cleaning of exam room while observing appropriate contact time as indicated for disinfecting solutions.    Signed Lamar Blinks, MD  Received her labs as below, message to patient A1c is stable Results for orders placed or performed in visit on 12/12/19  CBC  Result Value Ref Range   WBC 8.4 4.0 - 10.5 K/uL   RBC 4.61 3.87 - 5.11 Mil/uL   Platelets 353.0 150 - 400 K/uL   Hemoglobin 13.9 12.0 - 15.0 g/dL   HCT 40.7 36 - 46 %   MCV 88.2 78.0 - 100.0 fl   MCHC 34.1 30.0 - 36.0 g/dL   RDW 14.5 11.5 - 15.5 %  Comprehensive metabolic panel  Result Value Ref Range   Sodium 139 135 - 145 mEq/L   Potassium 3.5 3.5 - 5.1 mEq/L   Chloride 102 96 - 112 mEq/L   CO2 31 19 - 32 mEq/L   Glucose, Bld 97 70 - 99 mg/dL   BUN 11 6 - 23 mg/dL   Creatinine, Ser 0.68 0.40 - 1.20 mg/dL   Total Bilirubin 0.4 0.2 - 1.2 mg/dL   Alkaline Phosphatase 85 39 - 117 U/L   AST 12 0 - 37 U/L   ALT 12 0 - 35 U/L   Total Protein 7.0 6.0 - 8.3 g/dL   Albumin 3.9 3.5 - 5.2 g/dL   GFR 106.10 >60.00 mL/min   Calcium 9.6 8.4 - 10.5 mg/dL  Hemoglobin A1c  Result Value Ref Range   Hgb A1c MFr Bld 6.8 (H) 4.6 - 6.5 %  Lipid panel  Result Value Ref Range   Cholesterol 271 (H) 0 - 200 mg/dL   Triglycerides 94.0 0 - 149 mg/dL   HDL 61.40 >39.00 mg/dL   VLDL 18.8 0.0 - 40.0 mg/dL   LDL  Cholesterol 191 (H) 0 - 99 mg/dL   Total CHOL/HDL Ratio 4    NonHDL 209.40   TSH  Result Value Ref Range   TSH 0.89 0.35 - 4.50 uIU/mL  VITAMIN D 25 Hydroxy (Vit-D Deficiency, Fractures)  Result Value Ref Range   VITD 24.20 (L) 30.00 - 100.00 ng/mL

## 2019-12-12 ENCOUNTER — Encounter: Payer: Self-pay | Admitting: Family Medicine

## 2019-12-12 ENCOUNTER — Other Ambulatory Visit: Payer: Self-pay | Admitting: Family Medicine

## 2019-12-12 ENCOUNTER — Other Ambulatory Visit: Payer: Self-pay

## 2019-12-12 ENCOUNTER — Ambulatory Visit (INDEPENDENT_AMBULATORY_CARE_PROVIDER_SITE_OTHER): Payer: BC Managed Care – PPO | Admitting: Family Medicine

## 2019-12-12 VITALS — BP 126/82 | HR 102 | Temp 96.8°F | Resp 17 | Ht 62.0 in | Wt 205.0 lb

## 2019-12-12 DIAGNOSIS — E119 Type 2 diabetes mellitus without complications: Secondary | ICD-10-CM | POA: Diagnosis not present

## 2019-12-12 DIAGNOSIS — Z0000 Encounter for general adult medical examination without abnormal findings: Secondary | ICD-10-CM | POA: Diagnosis not present

## 2019-12-12 DIAGNOSIS — Z1329 Encounter for screening for other suspected endocrine disorder: Secondary | ICD-10-CM

## 2019-12-12 DIAGNOSIS — E559 Vitamin D deficiency, unspecified: Secondary | ICD-10-CM

## 2019-12-12 DIAGNOSIS — I10 Essential (primary) hypertension: Secondary | ICD-10-CM

## 2019-12-12 DIAGNOSIS — M79601 Pain in right arm: Secondary | ICD-10-CM

## 2019-12-12 DIAGNOSIS — Z20820 Contact with and (suspected) exposure to varicella: Secondary | ICD-10-CM

## 2019-12-12 DIAGNOSIS — E785 Hyperlipidemia, unspecified: Secondary | ICD-10-CM | POA: Diagnosis not present

## 2019-12-12 DIAGNOSIS — R7303 Prediabetes: Secondary | ICD-10-CM

## 2019-12-12 LAB — CBC
HCT: 40.7 % (ref 36.0–46.0)
Hemoglobin: 13.9 g/dL (ref 12.0–15.0)
MCHC: 34.1 g/dL (ref 30.0–36.0)
MCV: 88.2 fl (ref 78.0–100.0)
Platelets: 353 10*3/uL (ref 150.0–400.0)
RBC: 4.61 Mil/uL (ref 3.87–5.11)
RDW: 14.5 % (ref 11.5–15.5)
WBC: 8.4 10*3/uL (ref 4.0–10.5)

## 2019-12-12 LAB — COMPREHENSIVE METABOLIC PANEL
ALT: 12 U/L (ref 0–35)
AST: 12 U/L (ref 0–37)
Albumin: 3.9 g/dL (ref 3.5–5.2)
Alkaline Phosphatase: 85 U/L (ref 39–117)
BUN: 11 mg/dL (ref 6–23)
CO2: 31 mEq/L (ref 19–32)
Calcium: 9.6 mg/dL (ref 8.4–10.5)
Chloride: 102 mEq/L (ref 96–112)
Creatinine, Ser: 0.68 mg/dL (ref 0.40–1.20)
GFR: 106.1 mL/min (ref >60.00)
Glucose, Bld: 97 mg/dL (ref 70–99)
Potassium: 3.5 mEq/L (ref 3.5–5.1)
Sodium: 139 mEq/L (ref 135–145)
Total Bilirubin: 0.4 mg/dL (ref 0.2–1.2)
Total Protein: 7 g/dL (ref 6.0–8.3)

## 2019-12-12 LAB — LIPID PANEL
Cholesterol: 271 mg/dL — ABNORMAL HIGH (ref 0–200)
HDL: 61.4 mg/dL (ref >39.00)
LDL Cholesterol: 191 mg/dL — ABNORMAL HIGH (ref 0–99)
NonHDL: 209.4
Total CHOL/HDL Ratio: 4
Triglycerides: 94 mg/dL (ref 0.0–149.0)
VLDL: 18.8 mg/dL (ref 0.0–40.0)

## 2019-12-12 LAB — TSH: TSH: 0.89 u[IU]/mL (ref 0.35–4.50)

## 2019-12-12 LAB — HEMOGLOBIN A1C: Hgb A1c MFr Bld: 6.8 % — ABNORMAL HIGH (ref 4.6–6.5)

## 2019-12-12 LAB — VITAMIN D 25 HYDROXY (VIT D DEFICIENCY, FRACTURES): VITD: 24.2 ng/mL — ABNORMAL LOW (ref 30.00–100.00)

## 2019-12-12 MED ORDER — METFORMIN HCL 500 MG PO TABS: ORAL_TABLET | ORAL | 3 refills | Status: DC

## 2019-12-12 MED ORDER — LOVASTATIN 20 MG PO TABS: 20.0000 mg | ORAL_TABLET | Freq: Every day | ORAL | 3 refills | Status: DC

## 2019-12-13 LAB — VARICELLA ZOSTER ANTIBODY, IGG: Varicella IgG: 1275 index

## 2019-12-14 ENCOUNTER — Encounter: Payer: Self-pay | Admitting: Family Medicine

## 2019-12-17 ENCOUNTER — Ambulatory Visit: Payer: BC Managed Care – PPO | Admitting: Orthopedic Surgery

## 2019-12-25 ENCOUNTER — Ambulatory Visit (HOSPITAL_BASED_OUTPATIENT_CLINIC_OR_DEPARTMENT_OTHER): Payer: BC Managed Care – PPO

## 2019-12-25 ENCOUNTER — Ambulatory Visit: Payer: Self-pay

## 2019-12-25 ENCOUNTER — Ambulatory Visit: Payer: BC Managed Care – PPO | Admitting: Orthopedic Surgery

## 2019-12-25 DIAGNOSIS — M79601 Pain in right arm: Secondary | ICD-10-CM

## 2019-12-27 ENCOUNTER — Other Ambulatory Visit: Payer: Self-pay | Admitting: Orthopedic Surgery

## 2019-12-27 ENCOUNTER — Encounter: Payer: Self-pay | Admitting: Orthopedic Surgery

## 2019-12-27 DIAGNOSIS — M542 Cervicalgia: Secondary | ICD-10-CM

## 2019-12-27 MED ORDER — CYCLOBENZAPRINE HCL 5 MG PO TABS: 5.0000 mg | ORAL_TABLET | Freq: Three times a day (TID) | ORAL | Status: DC | PRN

## 2019-12-27 MED ORDER — PREDNISONE 10 MG (21) PO TBPK
ORAL_TABLET | ORAL | 0 refills | Status: DC
Start: 2019-12-27 — End: 2020-05-28

## 2019-12-27 MED ORDER — METHOCARBAMOL 500 MG PO TABS
500.0000 mg | ORAL_TABLET | Freq: Two times a day (BID) | ORAL | 0 refills | Status: DC | PRN
Start: 2019-12-27 — End: 2020-05-28

## 2019-12-27 NOTE — Progress Notes (Signed)
Order entered for MRI.  Both rx sent to pharmacy. Patient aware.

## 2019-12-27 NOTE — Telephone Encounter (Signed)
Sent in I think can u check thx

## 2019-12-27 NOTE — Addendum Note (Signed)
Addended by: Meyer Cory on: 12/27/2019 02:54 PM   Modules accepted: Orders

## 2019-12-27 NOTE — Telephone Encounter (Signed)
Please see below. You prescribed robaxin, however, pharmacy has listed alternatives.

## 2019-12-27 NOTE — Progress Notes (Signed)
Office Visit Note   Patient: Misty Bell           Date of Birth: 11-13-1955           MRN: [id number] Visit Date: 12/25/2019 Requested by: Darreld Mclean, MD [address] [place],  [place] [address] PCP: Darreld Mclean, MD  Subjective: Chief Complaint  Patient presents with  . Arm Pain    HPI: Misty Bell is a patient with several month history of deep right arm pain.  Denies any history of injury.  Localizes the pain deep to the anterior biceps radiating into the forearm and into the hand.  The pain will wake her from sleep at night on some nights.  Denies any numbness and tingling in the arm.  Takes ibuprofen occasionally but it hurts her stomach.  She is also using icy hot.  She states that her symptoms are better when she places her arm overhead.  She does have a history of neck surgery in 2002.  She describes subjective weakness and loss of dexterity in the right hand.  She also reports right middle finger triggering of several weeks duration.  She denies any discrete neck pain.  She cannot really localize and palpate the pain other than to say that it is deep "within the muscles"              ROS: All systems reviewed are negative as they relate to the chief complaint within the history of present illness.  Patient denies  fevers or chills.   Assessment & Plan: Visit Diagnoses:  1. Right arm pain     Plan: Impression is right arm radicular pain without numbness and tingling.  She has a history of cervical spine surgery.  Radiographs and exam of the forearm elbow and shoulder are unremarkable.  I think is likely she has the symptoms originating from her neck even though she does not have neck pain per se.  Plan is MRI cervical spine to evaluate right-sided radiculopathy.  Anti-inflammatories are bothering her stomach.  We could try Robaxin as a muscle relaxer and steroid Dosepak for 6 days to see if that can help her symptoms.  We will see her back after the  cervical spine MRI.  Follow-Up Instructions: Return for after MRI.   Orders:  Orders Placed This Encounter  Procedures  . XR Cervical Spine 2 or 3 views  . XR Humerus Right  . XR Forearm Right   No orders of the defined types were placed in this encounter.     Procedures: No procedures performed   Clinical Data: No additional findings.  Objective: Vital Signs: There were no vitals taken for this visit.  Physical Exam:   Constitutional: Patient appears well-developed HEENT:  Head: Normocephalic Eyes:EOM are normal Neck: Normal range of motion Cardiovascular: Normal rate Pulmonary/chest: Effort normal Neurologic: Patient is alert Skin: Skin is warm Psychiatric: Patient has normal mood and affect    Ortho Exam: Ortho exam demonstrates good cervical spine range of motion with flexion extension rotation.  Patient has 5 out of 5 grip EPL FPL interosseous wrist flexion extension bicep triceps and deltoid strength.  Radial pulses palpable bilaterally.  Wrist elbow shoulder range of motion is full without restriction of external rotation of the shoulder.  No scapular dyskinesia with forward flexion of the arm on the right left-hand side.  Reflexes symmetric bilateral biceps triceps.  No real tenderness to palpation of the forearm or humerus region.  No muscle atrophy in the arms.  Rotator cuff strength on the right-hand side is good infraspinatus supraspinatus and subscap muscle testing.  No other masses lymphadenopathy or skin changes noted in that shoulder or neck region.  Specialty Comments:  No specialty comments available.  Imaging: No results found.   PMFS History: Patient Active Problem List   Diagnosis Date Noted  . Chronic venous insufficiency 04/02/2019  . Other fatigue 09/25/2017  . Shortness of breath on exertion 09/25/2017  . Status post gastric banding 09/25/2017  . Hypercholesterolemia 09/14/2015  . Obesity, Class III, BMI 40-49.9 (morbid obesity) ([place])  09/14/2015  . Hiatal hernia 09/14/2015  . Osteoarthritis of both knees 09/14/2015  . DOE (dyspnea on exertion) 09/02/2015  . Anxiety disorder 04/01/2015  . Back pain 04/01/2015  . Diabetes mellitus type 2 in obese ([place]) 04/01/2015  . Essential hypertension 04/01/2015  . Migraine 04/01/2015  . Spider nevus of skin 10/29/2012   Past Medical History:  Diagnosis Date  . Anxiety   . Anxiety disorder   . Back pain   . Diabetes mellitus type 2 in obese ([facility])    pre-diabetic  . Hypertension 2010  . Knee pain   . Migraine   . Primary osteoarthritis of both knees   . Snoring   . Varicose veins     Family History  Problem Relation Age of Onset  . Heart disease Mother   . Cervical cancer Mother   . High blood pressure Mother   . High Cholesterol Mother   . Sudden death Mother   . Other Father   . Diabetes Other        [place]  . Heart disease Other        female >60, [facility]  . Pneumonia Sister   . Heart disease Sister   . Healthy Sister   . Colon cancer Neg Hx     Past Surgical History:  Procedure Laterality Date  . COLONOSCOPY    . DILATION AND CURETTAGE OF UTERUS     years ago-several  . FRACTURE SURGERY     neck/ 18 years ago  . HIATAL HERNIA REPAIR  09/14/2015   Procedure: LAPAROSCOPIC REPAIR OF HIATAL HERNIA;  Surgeon: Greer Pickerel, MD;  Location: [facility];  Service: General;;  . LAPAROSCOPIC GASTRIC SLEEVE RESECTION N/A 09/14/2015   Procedure: LAPAROSCOPIC GASTRIC SLEEVE RESECTION W/UPPER ENDO;  Surgeon: Greer Pickerel, MD;  Location: [facility];  Service: General;  Laterality: N/A;  . uterine ablation  08/2010   Social History   Occupational History  . Not on file  Tobacco Use  . Smoking status: Former Smoker    Types: Cigarettes    Quit date: 07/27/2015    Years since quitting: 4.4  . Smokeless tobacco: Never Used  Substance and Sexual Activity  . Alcohol use: Yes    Alcohol/week: 4.0 standard drinks    Types: 4 Glasses of wine per week  . Drug use: No  . Sexual  activity: Yes

## 2019-12-31 NOTE — Telephone Encounter (Signed)
Ok for tizanidine 2 mg po q 8 prn spasm thx

## 2020-01-01 NOTE — Telephone Encounter (Signed)
FYI This rx was refused due to you already sending it in electronically. Rx for cyclobenzaprine was sent to pharmacy and received.

## 2020-01-01 NOTE — Telephone Encounter (Signed)
Ok thx.

## 2020-01-15 ENCOUNTER — Other Ambulatory Visit: Payer: Self-pay | Admitting: Family Medicine

## 2020-01-15 ENCOUNTER — Other Ambulatory Visit: Payer: BC Managed Care – PPO

## 2020-01-16 ENCOUNTER — Other Ambulatory Visit: Payer: Self-pay

## 2020-01-16 ENCOUNTER — Ambulatory Visit (HOSPITAL_BASED_OUTPATIENT_CLINIC_OR_DEPARTMENT_OTHER)
Admission: RE | Admit: 2020-01-16 | Discharge: 2020-01-16 | Disposition: A | Discharge status: Home / Self Care | Payer: BC Managed Care – PPO | Source: Ambulatory Visit | Attending: Obstetrics & Gynecology | Admitting: Obstetrics & Gynecology

## 2020-01-16 DIAGNOSIS — Z1231 Encounter for screening mammogram for malignant neoplasm of breast: Secondary | ICD-10-CM | POA: Insufficient documentation

## 2020-01-16 DIAGNOSIS — Z01419 Encounter for gynecological examination (general) (routine) without abnormal findings: Secondary | ICD-10-CM

## 2020-01-21 ENCOUNTER — Ambulatory Visit
Admission: RE | Admit: 2020-01-21 | Discharge: 2020-01-21 | Disposition: A | Discharge status: Home / Self Care | Payer: BC Managed Care – PPO | Source: Ambulatory Visit | Attending: Orthopedic Surgery | Admitting: Orthopedic Surgery

## 2020-01-21 ENCOUNTER — Other Ambulatory Visit: Payer: Self-pay

## 2020-01-21 DIAGNOSIS — M79601 Pain in right arm: Secondary | ICD-10-CM

## 2020-01-23 ENCOUNTER — Ambulatory Visit (INDEPENDENT_AMBULATORY_CARE_PROVIDER_SITE_OTHER): Payer: BC Managed Care – PPO | Admitting: Orthopedic Surgery

## 2020-01-23 ENCOUNTER — Encounter: Payer: Self-pay | Admitting: Orthopedic Surgery

## 2020-01-23 DIAGNOSIS — M542 Cervicalgia: Secondary | ICD-10-CM | POA: Diagnosis not present

## 2020-01-23 DIAGNOSIS — M79601 Pain in right arm: Secondary | ICD-10-CM | POA: Diagnosis not present

## 2020-01-23 NOTE — Progress Notes (Signed)
Office Visit Note   Patient: Misty Bell           Date of Birth: 1955/11/06           MRN: [id number] Visit Date: 01/23/2020 Requested by: Darreld Mclean, MD [address] [place],  [place] [address] PCP: Darreld Mclean, MD  Subjective: Chief Complaint  Patient presents with  . Follow-up    HPI: Misty Bell is a 64 year old patient here to follow-up MRI scan of her cervical spine.  She is not having much neck pain but she is having a lot of deep right arm pain.  Some relief with overhead motion.  She has pain on a daily basis but not 24/7.  Ibuprofen does help.  I reviewed the MRI scan and it does look like she has a cyst on the right-hand side around the C5-6 foramen.  Final reading is pending.              ROS: All systems reviewed are negative as they relate to the chief complaint within the history of present illness.  Patient denies  fevers or chills.   Assessment & Plan: Visit Diagnoses:  1. Neck pain   2. Right arm pain     Plan: Impression is right-sided radiculopathy with normal shoulder and arm exam.  Having deep aching pain consistent with nerve root impingement.  Not much arthritis but she does have a small cyst on the right-hand side around the foraminal exiting point.  Plan for referral to Dr. Ernestina Patches for cervical spine ESI for diagnostic and therapeutic purposes.  I will see her back after that event if she is continuing to have symptoms.  May need to consider decompression if symptoms do not improve.  Follow-Up Instructions: No follow-ups on file.   Orders:  Orders Placed This Encounter  Procedures  . Ambulatory referral to Physical Medicine Rehab   No orders of the defined types were placed in this encounter.     Procedures: No procedures performed   Clinical Data: No additional findings.  Objective: Vital Signs: There were no vitals taken for this visit.  Physical Exam:   Constitutional: Patient appears  well-developed HEENT:  Head: Normocephalic Eyes:EOM are normal Neck: Normal range of motion Cardiovascular: Normal rate Pulmonary/chest: Effort normal Neurologic: Patient is alert Skin: Skin is warm Psychiatric: Patient has normal mood and affect    Ortho Exam: Ortho exam demonstrates full active and passive range of motion of the cervical spine.  5 out of 5 grip EPL FPL interosseous wrist flexion extension bicep triceps and deltoid strength.  No muscle atrophy in either the right or left arm.  Muscle strength is symmetric.  No paresthesias C5-T1.  Radial pulse intact bilaterally.  Specialty Comments:  No specialty comments available.  Imaging: No results found.   PMFS History: Patient Active Problem List   Diagnosis Date Noted  . Chronic venous insufficiency 04/02/2019  . Other fatigue 09/25/2017  . Shortness of breath on exertion 09/25/2017  . Status post gastric banding 09/25/2017  . Hypercholesterolemia 09/14/2015  . Obesity, Class III, BMI 40-49.9 (morbid obesity) ([place]) 09/14/2015  . Hiatal hernia 09/14/2015  . Osteoarthritis of both knees 09/14/2015  . DOE (dyspnea on exertion) 09/02/2015  . Anxiety disorder 04/01/2015  . Back pain 04/01/2015  . Diabetes mellitus type 2 in obese ([place]) 04/01/2015  . Essential hypertension 04/01/2015  . Migraine 04/01/2015  . Spider nevus of skin 10/29/2012   Past Medical History:  Diagnosis  Date  . Anxiety   . Anxiety disorder   . Back pain   . Diabetes mellitus type 2 in obese ([facility])    pre-diabetic  . Hypertension 2010  . Knee pain   . Migraine   . Primary osteoarthritis of both knees   . Snoring   . Varicose veins     Family History  Problem Relation Age of Onset  . Heart disease Mother   . Cervical cancer Mother   . High blood pressure Mother   . High Cholesterol Mother   . Sudden death Mother   . Other Father   . Diabetes Other        [place]  . Heart disease Other        female >79, [facility]  . Pneumonia Sister   .  Heart disease Sister   . Healthy Sister   . Colon cancer Neg Hx     Past Surgical History:  Procedure Laterality Date  . COLONOSCOPY    . DILATION AND CURETTAGE OF UTERUS     years ago-several  . FRACTURE SURGERY     neck/ 18 years ago  . HIATAL HERNIA REPAIR  09/14/2015   Procedure: LAPAROSCOPIC REPAIR OF HIATAL HERNIA;  Surgeon: Greer Pickerel, MD;  Location: [facility];  Service: General;;  . LAPAROSCOPIC GASTRIC SLEEVE RESECTION N/A 09/14/2015   Procedure: LAPAROSCOPIC GASTRIC SLEEVE RESECTION W/UPPER ENDO;  Surgeon: Greer Pickerel, MD;  Location: [facility];  Service: General;  Laterality: N/A;  . uterine ablation  08/2010   Social History   Occupational History  . Not on file  Tobacco Use  . Smoking status: Former Smoker    Types: Cigarettes    Quit date: 07/27/2015    Years since quitting: 4.4  . Smokeless tobacco: Never Used  Substance and Sexual Activity  . Alcohol use: Yes    Alcohol/week: 4.0 standard drinks    Types: 4 Glasses of wine per week  . Drug use: No  . Sexual activity: Yes

## 2020-02-10 ENCOUNTER — Encounter: Payer: BC Managed Care – PPO | Admitting: Physical Medicine and Rehabilitation

## 2020-02-25 ENCOUNTER — Encounter: Payer: Self-pay | Admitting: Family Medicine

## 2020-02-25 ENCOUNTER — Other Ambulatory Visit: Payer: Self-pay | Admitting: Family Medicine

## 2020-02-25 DIAGNOSIS — R600 Localized edema: Secondary | ICD-10-CM

## 2020-02-25 DIAGNOSIS — E119 Type 2 diabetes mellitus without complications: Secondary | ICD-10-CM

## 2020-02-25 MED ORDER — FUROSEMIDE 20 MG PO TABS: 20.0000 mg | ORAL_TABLET | Freq: Every day | ORAL | 3 refills | Status: DC

## 2020-02-25 MED ORDER — AMLODIPINE BESYLATE 10 MG PO TABS: 10.0000 mg | ORAL_TABLET | Freq: Every day | ORAL | 3 refills | Status: DC

## 2020-04-10 ENCOUNTER — Ambulatory Visit: Payer: BC Managed Care – PPO

## 2020-04-21 NOTE — Progress Notes (Deleted)
[place] [facility] [address], [place], [place] [address] [phone]  Date:  04/27/2020   Name:  Misty Bell   DOB:  [birth year]   MRN:  [id number]  PCP:  Darreld Mclean, MD    Chief Complaint: No chief complaint on file.   History of Present Illness:  Misty Bell is a 64 y.o. very pleasant female patient who presents with the following:  Krystiana recently got new health insurance, is here today for follow-up visit- History of controlled DM, HTN, migraine HA, OA knees, hyperlipidemia, obesity s/p lap band  Last seen by myself in June Since that time, she has been seen Alphonzo Severance with orthopedics regarding some neck pain: Plan: Impression is right-sided radiculopathy with normal shoulder and arm exam.  Having deep aching pain consistent with nerve root impingement.  Not much arthritis but she does have a small cyst on the right-hand side around the foraminal exiting point.  Plan for referral to Dr. Ernestina Patches for cervical spine ESI for diagnostic and therapeutic purposes.  I will see her back after that event if she is continuing to have symptoms.  May need to consider decompression if symptoms do not improve.  Flu vaccine Can repeat A1c today if she likes Urine microalbumin due  Lab Results  Component Value Date   HGBA1C 6.8 (H) 12/12/2019     Patient Active Problem List   Diagnosis Date Noted  . Chronic venous insufficiency 04/02/2019  . Other fatigue 09/25/2017  . Shortness of breath on exertion 09/25/2017  . Status post gastric banding 09/25/2017  . Hypercholesterolemia 09/14/2015  . Obesity, Class III, BMI 40-49.9 (morbid obesity) ([place]) 09/14/2015  . Hiatal hernia 09/14/2015  . Osteoarthritis of both knees 09/14/2015  . DOE (dyspnea on exertion) 09/02/2015  . Anxiety disorder 04/01/2015  . Back pain 04/01/2015  . Diabetes mellitus type 2 in obese ([place]) 04/01/2015  . Essential hypertension  04/01/2015  . Migraine 04/01/2015  . Spider nevus of skin 10/29/2012    Past Medical History:  Diagnosis Date  . Anxiety   . Anxiety disorder   . Back pain   . Diabetes mellitus type 2 in obese (HCC)    pre-diabetic  . Hypertension 2010  . Knee pain   . Migraine   . Primary osteoarthritis of both knees   . Snoring   . Varicose veins     Past Surgical History:  Procedure Laterality Date  . COLONOSCOPY    . DILATION AND CURETTAGE OF UTERUS     years ago-several  . FRACTURE SURGERY     neck/ 18 years ago  . HIATAL HERNIA REPAIR  09/14/2015   Procedure: LAPAROSCOPIC REPAIR OF HIATAL HERNIA;  Surgeon: Greer Pickerel, MD;  Location: [facility];  Service: General;;  . LAPAROSCOPIC GASTRIC SLEEVE RESECTION N/A 09/14/2015   Procedure: LAPAROSCOPIC GASTRIC SLEEVE RESECTION W/UPPER ENDO;  Surgeon: Greer Pickerel, MD;  Location: [facility];  Service: General;  Laterality: N/A;  . uterine ablation  08/2010    Social History   Tobacco Use  . Smoking status: Former Smoker    Types: Cigarettes    Quit date: 07/27/2015    Years since quitting: 4.7  . Smokeless tobacco: Never Used  Substance Use Topics  . Alcohol use: Yes    Alcohol/week: 4.0 standard drinks    Types: 4 Glasses of wine per week  . Drug use: No    Family History  Problem  Relation Age of Onset  . Heart disease Mother   . Cervical cancer Mother   . High blood pressure Mother   . High Cholesterol Mother   . Sudden death Mother   . Other Father   . Diabetes Other        [place]  . Heart disease Other        female >56, [facility]  . Pneumonia Sister   . Heart disease Sister   . Healthy Sister   . Colon cancer Neg Hx     No Known Allergies  Medication list has been reviewed and updated.  Current Outpatient Medications on File Prior to Visit  Medication Sig Dispense Refill  . amLODipine (NORVASC) 10 MG tablet Take 1 tablet (10 mg total) by mouth daily. 90 tablet 3  . calcium citrate-vitamin D (CITRACAL+D) 315-200 MG-UNIT tablet  Take 1 tablet by mouth 2 (two) times daily.    . cyanocobalamin 500 MCG tablet Take 500 mcg by mouth daily.    . fluocinonide ointment (LIDEX) 9.67 % Apply 1 application topically 2 (two) times daily. Use as needed for palm. 30 g 1  . furosemide (LASIX) 20 MG tablet Take 1 tablet (20 mg total) by mouth daily. 90 tablet 3  . ibuprofen (ADVIL,MOTRIN) 200 MG tablet Take 200 mg by mouth every 6 (six) hours as needed for mild pain.    [name] lovastatin (MEVACOR) 20 MG tablet Take 1 tablet (20 mg total) by mouth at bedtime. 90 tablet 3  . metFORMIN (GLUCOPHAGE) 500 MG tablet TAKE 1 TABLET BY MOUTH EVERY DAY WITH BREAKFAST 90 tablet 3  . methocarbamol (ROBAXIN) 500 MG tablet Take 1 tablet (500 mg total) by mouth every 12 (twelve) hours as needed for muscle spasms. 30 tablet 0  . Multiple Vitamins-Minerals (CELEBRATE MULTI-COMPLETE 18) CHEW Chew by mouth daily.    . predniSONE (STERAPRED UNI-PAK 21 TAB) 10 MG (21) TBPK tablet Take as directed. 21 tablet 0  . sertraline (ZOLOFT) 50 MG tablet Take 1 tablet (50 mg total) by mouth daily. 30 tablet 3  . traZODone (DESYREL) 50 MG tablet Take 0.5-1 tablets (25-50 mg total) by mouth at bedtime as needed for sleep. 90 tablet 3   Current Facility-Administered Medications on File Prior to Visit  Medication Dose Route Frequency Provider Last Rate Last Admin  . cyclobenzaprine (FLEXERIL) tablet 5 mg  5 mg Oral TID PRN Meredith Pel, MD        Review of Systems:  As per HPI- otherwise negative.   Physical Examination: There were no vitals filed for this visit. There were no vitals filed for this visit. There is no height or weight on file to calculate BMI. Ideal Body Weight:    GEN: no acute distress. HEENT: Atraumatic, Normocephalic.  Ears and Nose: No external deformity. CV: RRR, No M/G/R. No JVD. No thrill. No extra heart sounds. PULM: CTA B, no wheezes, crackles, rhonchi. No retractions. No resp. distress. No accessory muscle use. ABD: S, NT, ND,  +BS. No rebound. No HSM. EXTR: No c/c/e PSYCH: Normally interactive. Conversant.    Assessment and Plan: *** This visit occurred during the SARS-CoV-2 public health emergency.  Safety protocols were in place, including screening questions prior to the visit, additional usage of staff PPE, and extensive cleaning of exam room while observing appropriate contact time as indicated for disinfecting solutions.    Signed Lamar Blinks, MD

## 2020-04-27 ENCOUNTER — Ambulatory Visit: Payer: BC Managed Care – PPO | Admitting: Family Medicine

## 2020-04-27 DIAGNOSIS — Z0289 Encounter for other administrative examinations: Secondary | ICD-10-CM

## 2020-05-23 NOTE — Progress Notes (Addendum)
[facility] [place], [place], [place] [address] [phone]  Date:  05/28/2020   Name:  Misty Bell   DOB:  27-Sep-1955   MRN:  [id number]  PCP:  Darreld Mclean, MD    Chief Complaint: Depression (6 month follow up), Anxiety (trouble sleeping due to stressor at school), and Diabetes   History of Present Illness:  Misty Bell is a 64 y.o. very pleasant female patient who presents with the following:  Here today for 71-month follow-up visit and flu shot  Last seen by myself in June for physical exam History of diabetes, hypertension, obesity, status post lap band  COVID booster- not done but she plans to do aspa  Give flu vaccine today Most recent labs in June A1c in June 6.8% Shingrix  Pt went back to school- she is getting an MSW program though [employer] and [employer]- she is also taking a stats class at [employer]. thanksfully her stats class is now over   She is not working right now- she is in full time class  Her depression sx are under pretty good control  Her family has lost some members recently  She is using zoloft and trazodone for sleep- she uses trazodone  She ran out of her zoloft a couple of weeks ago- she would like to restart   She is not getting a whole lot of exercise right now  She had Thanksgiving just with her sister She has 2 grown daughters- she has 40 grands, one who is 21 year old.   They bring her a lot of joy Patient Active Problem List   Diagnosis Date Noted  . Chronic venous insufficiency 04/02/2019  . Other fatigue 09/25/2017  . Shortness of breath on exertion 09/25/2017  . Status post gastric banding 09/25/2017  . Hypercholesterolemia 09/14/2015  . Obesity, Class III, BMI 40-49.9 (morbid obesity) ([place]) 09/14/2015  . Hiatal hernia 09/14/2015  . Osteoarthritis of both knees 09/14/2015  . DOE (dyspnea on exertion) 09/02/2015  . Anxiety disorder 04/01/2015  . Back pain  04/01/2015  . Diabetes mellitus type 2 in obese ([place]) 04/01/2015  . Essential hypertension 04/01/2015  . Migraine 04/01/2015  . Spider nevus of skin 10/29/2012    Past Medical History:  Diagnosis Date  . Anxiety   . Anxiety disorder   . Back pain   . Diabetes mellitus type 2 in obese ([facility])    pre-diabetic  . Hypertension 2010  . Knee pain   . Migraine   . Primary osteoarthritis of both knees   . Snoring   . Varicose veins     Past Surgical History:  Procedure Laterality Date  . COLONOSCOPY    . DILATION AND CURETTAGE OF UTERUS     years ago-several  . FRACTURE SURGERY     neck/ 18 years ago  . HIATAL HERNIA REPAIR  09/14/2015   Procedure: LAPAROSCOPIC REPAIR OF HIATAL HERNIA;  Surgeon: Greer Pickerel, MD;  Location: [facility];  Service: General;;  . LAPAROSCOPIC GASTRIC SLEEVE RESECTION N/A 09/14/2015   Procedure: LAPAROSCOPIC GASTRIC SLEEVE RESECTION W/UPPER ENDO;  Surgeon: Greer Pickerel, MD;  Location: [facility];  Service: General;  Laterality: N/A;  . uterine ablation  08/2010    Social History   Tobacco Use  . Smoking status: Former Smoker    Types: Cigarettes    Quit date: 07/27/2015    Years since quitting: 4.8  . Smokeless tobacco: Never  Used  Substance Use Topics  . Alcohol use: Yes    Alcohol/week: 4.0 standard drinks    Types: 4 Glasses of wine per week  . Drug use: No    Family History  Problem Relation Age of Onset  . Heart disease Mother   . Cervical cancer Mother   . High blood pressure Mother   . High Cholesterol Mother   . Sudden death Mother   . Other Father   . Diabetes Other        [place]  . Heart disease Other        female >40, [facility]  . Pneumonia Sister   . Heart disease Sister   . Healthy Sister   . Colon cancer Neg Hx     No Known Allergies  Medication list has been reviewed and updated.  Current Outpatient Medications on File Prior to Visit  Medication Sig Dispense Refill  . amLODipine (NORVASC) 10 MG tablet Take 1 tablet (10 mg total)  by mouth daily. 90 tablet 3  . calcium citrate-vitamin D (CITRACAL+D) 315-200 MG-UNIT tablet Take 1 tablet by mouth 2 (two) times daily.    . cyanocobalamin 500 MCG tablet Take 500 mcg by mouth daily.    . fluocinonide ointment (LIDEX) 9.73 % Apply 1 application topically 2 (two) times daily. Use as needed for palm. 30 g 1  . furosemide (LASIX) 20 MG tablet Take 1 tablet (20 mg total) by mouth daily. 90 tablet 3  . lovastatin (MEVACOR) 20 MG tablet Take 1 tablet (20 mg total) by mouth at bedtime. 90 tablet 3  . Multiple Vitamins-Minerals (CELEBRATE MULTI-COMPLETE 18) CHEW Chew by mouth daily.    . sertraline (ZOLOFT) 50 MG tablet Take 1 tablet (50 mg total) by mouth daily. 30 tablet 3  . traZODone (DESYREL) 50 MG tablet Take 0.5-1 tablets (25-50 mg total) by mouth at bedtime as needed for sleep. 90 tablet 3  . metFORMIN (GLUCOPHAGE) 500 MG tablet TAKE 1 TABLET BY MOUTH EVERY DAY WITH BREAKFAST (Patient not taking: Reported on 05/28/2020) 90 tablet 3   Current Facility-Administered Medications on File Prior to Visit  Medication Dose Route Frequency Provider Last Rate Last Admin  . cyclobenzaprine (FLEXERIL) tablet 5 mg  5 mg Oral TID PRN Meredith Pel, MD        Review of Systems:  As per HPI- otherwise negative.   Physical Examination: Vitals:   05/28/20 1051  BP: 114/80  Pulse: 95  Resp: 18  SpO2: 97%   Vitals:   05/28/20 1051  Weight: 199 lb (90.3 kg)  Height: 5\' 2"  (1.575 m)   Body mass index is 36.4 kg/m. Ideal Body Weight: Weight in (lb) to have BMI = 25: 136.4  GEN: no acute distress.  Obese, otherwise looks well HEENT: Atraumatic, Normocephalic.   Bilateral TM wnl, oropharynx normal.  PEERL,EOMI.   Ears and Nose: No external deformity. CV: RRR, No M/G/R. No JVD. No thrill. No extra heart sounds. PULM: CTA B, no wheezes, crackles, rhonchi. No retractions. No resp. distress. No accessory muscle use. EXTR: No c/c/e PSYCH: Normally interactive. Conversant.     Assessment and Plan: Controlled type 2 diabetes mellitus without complication, without long-term current use of insulin ([place]) - Plan: Hemoglobin A1c, Microalbumin / creatinine urine ratio  Vitamin D deficiency - Plan: VITAMIN D 25 Hydroxy (Vit-D Deficiency, Fractures)  Essential hypertension  Dyslipidemia  Fatigue due to depression - Plan: VITAMIN D 25 Hydroxy (Vit-D Deficiency, Fractures)  Immunization due - Plan:  Flu Vaccine QUAD 6+ mos PF IM (Fluarix Quad PF)  Adjustment disorder with depressed mood - Plan: sertraline (ZOLOFT) 100 MG tablet  Needs flu shot  Patient today for a follow-up visit History of generally well controlled diabetes, will check A1c and microalbumin today Blood pressures under good control, continue Lasix and amlodipine Lovastatin for dyslipidemia Flu vaccine given today Misty Bell has a lot on her plate right now, she is taking part in a challenging graduate education course.  She notes some increase of depression and anxiety symptoms.  No suicidal ideation.  She is taking trazodone at bedtime for sleep.  She has been taking Zoloft 50 mg, she ran out about 2 weeks ago but would like to restart it perhaps increase the dose  I asked her to start back on sertraline at 50 mg for 2 weeks, then can increase to 100 mg.  We did discuss signs and symptoms of serotonin syndrome as she is also taking trazodone  I asked her to please let me know if her depression and anxiety symptoms are not getting better-otherwise, plan to visit in 6 months  This visit occurred during the SARS-CoV-2 public health emergency.  Safety protocols were in place, including screening questions prior to the visit, additional usage of staff PPE, and extensive cleaning of exam room while observing appropriate contact time as indicated for disinfecting solutions.    Signed Lamar Blinks, MD  Received her labs as below, message to patient Results for orders placed or performed in visit on  05/28/20  Hemoglobin A1c  Result Value Ref Range   Hgb A1c MFr Bld 7.1 (H) 4.6 - 6.5 %  VITAMIN D 25 Hydroxy (Vit-D Deficiency, Fractures)  Result Value Ref Range   VITD 26.47 (L) 30.00 - 100.00 ng/mL  Microalbumin / creatinine urine ratio  Result Value Ref Range   Microalb, Ur 2.2 (H) 0.0 - 1.9 mg/dL   Creatinine,U 127.8 mg/dL   Microalb Creat Ratio 1.7 0.0 - 30.0 mg/g

## 2020-05-23 NOTE — Patient Instructions (Addendum)
It was great to see you again today, I will be in touch your labs as soon as possible Best of luck with your studies this spring! Try to take time for yourself as well- exercise and relaxation are essential for your health Please get your covid 19 booster asap  We increased your sertraline to 100 mg; start back on a 1/2 tablet for a couple of weeks and then increase to 100 mg  Please let me know if you are not doing ok emotionally over the next several months- otherwise we can plan to visit in 6 months

## 2020-05-28 ENCOUNTER — Encounter: Payer: Self-pay | Admitting: Family Medicine

## 2020-05-28 ENCOUNTER — Ambulatory Visit: Payer: 59 | Attending: Family

## 2020-05-28 ENCOUNTER — Ambulatory Visit (INDEPENDENT_AMBULATORY_CARE_PROVIDER_SITE_OTHER): Payer: 59 | Admitting: Family Medicine

## 2020-05-28 ENCOUNTER — Other Ambulatory Visit: Payer: Self-pay

## 2020-05-28 ENCOUNTER — Ambulatory Visit: Payer: BC Managed Care – PPO

## 2020-05-28 VITALS — BP 114/80 | HR 95 | Resp 18 | Ht 62.0 in | Wt 199.0 lb

## 2020-05-28 DIAGNOSIS — E119 Type 2 diabetes mellitus without complications: Secondary | ICD-10-CM | POA: Diagnosis not present

## 2020-05-28 DIAGNOSIS — I10 Essential (primary) hypertension: Secondary | ICD-10-CM

## 2020-05-28 DIAGNOSIS — E559 Vitamin D deficiency, unspecified: Secondary | ICD-10-CM | POA: Diagnosis not present

## 2020-05-28 DIAGNOSIS — Z23 Encounter for immunization: Secondary | ICD-10-CM

## 2020-05-28 DIAGNOSIS — F32A Depression, unspecified: Secondary | ICD-10-CM | POA: Diagnosis not present

## 2020-05-28 DIAGNOSIS — R5383 Other fatigue: Secondary | ICD-10-CM | POA: Diagnosis not present

## 2020-05-28 DIAGNOSIS — E785 Hyperlipidemia, unspecified: Secondary | ICD-10-CM

## 2020-05-28 DIAGNOSIS — F4321 Adjustment disorder with depressed mood: Secondary | ICD-10-CM

## 2020-05-28 LAB — HEMOGLOBIN A1C: Hgb A1c MFr Bld: 7.1 % — ABNORMAL HIGH (ref 4.6–6.5)

## 2020-05-28 LAB — MICROALBUMIN / CREATININE URINE RATIO
Creatinine,U: 127.8 mg/dL
Microalb Creat Ratio: 1.7 mg/g (ref 0.0–30.0)
Microalb, Ur: 2.2 mg/dL — ABNORMAL HIGH (ref 0.0–1.9)

## 2020-05-28 LAB — VITAMIN D 25 HYDROXY (VIT D DEFICIENCY, FRACTURES): VITD: 26.47 ng/mL — ABNORMAL LOW (ref 30.00–100.00)

## 2020-05-28 MED ORDER — SERTRALINE HCL 100 MG PO TABS: 100.0000 mg | ORAL_TABLET | Freq: Every day | ORAL | 3 refills | Status: DC

## 2020-09-11 NOTE — Progress Notes (Signed)
   Covid-19 Vaccination Clinic  Name:  Misty Bell    MRN: [id number] DOB: 13-May-1956  09/11/2020  Misty Bell was observed post Covid-19 immunization for 15 minutes without incident. She was provided with Vaccine Information Sheet and instruction to access the V-Safe system.   Misty Bell was instructed to call 911 with any severe reactions post vaccine: [name] Difficulty breathing  . Swelling of face and throat  . A fast heartbeat  . A bad rash all over body  . Dizziness and weakness   Immunizations Administered    Name Date Dose VIS Date Route   Moderna Covid-19 Booster Vaccine 05/28/2020  2:30 PM 0.25 mL 04/15/2020 Intramuscular   Manufacturer: Moderna   Lot: [id number]   [place]: 48185-909-31

## 2020-09-15 ENCOUNTER — Encounter: Payer: Self-pay | Admitting: Family Medicine

## 2020-09-16 NOTE — Progress Notes (Signed)
[facility] [address], [place], [place] [address] [phone]  Date:  09/17/2020   Name:  Misty Bell   DOB:  1956-01-31   MRN:  [id number]  PCP:  Darreld Mclean, MD    Chief Complaint: Fall (3 weeks ago, still experiencing pain, fell forward, knee pain, shoulder and neck pain, no bruising or swelling)   History of Present Illness:  Misty Bell is a 65 y.o. 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  She lost her balance and fell forward onto her hands and "flat on the ground" She hit her face on the ground.   Her arms and neck are still sore She has flexeril and ibuprofen but this does not seem to be helping  Pt notes that she was supposed to be seen in person today but she is having fecal accidents and had an accident at work and had to go back home She is having fecal matter coming from her vagina she thinks.  She had this issue years ago after an episiotomy about 30 years ago, her 3rd baby  She is seeing her GYN soon to discuss further  No fever and she does not have any flu like symptoms     Patient Active Problem List   Diagnosis Date Noted   Chronic venous insufficiency 04/02/2019   Other fatigue 09/25/2017   Shortness of breath on exertion 09/25/2017   Status post gastric banding 09/25/2017   Hypercholesterolemia 09/14/2015   Obesity, Class III, BMI 40-49.9 (morbid obesity) ([place]) 09/14/2015   Hiatal hernia  09/14/2015   Osteoarthritis of both knees 09/14/2015   DOE (dyspnea on exertion) 09/02/2015   Anxiety disorder 04/01/2015   Back pain 04/01/2015   Diabetes mellitus type 2 in obese ([place]) 04/01/2015   Essential hypertension 04/01/2015   Migraine 04/01/2015   Spider nevus of skin 10/29/2012    Past Medical History:  Diagnosis Date   Anxiety    Anxiety disorder    Back pain    Diabetes mellitus type 2 in obese ([place])    pre-diabetic   Hypertension 2010   Knee pain    Migraine    Primary osteoarthritis of both knees    Snoring    Varicose veins     Past Surgical History:  Procedure Laterality Date   COLONOSCOPY     DILATION AND CURETTAGE OF UTERUS     years ago-several   FRACTURE SURGERY     neck/ 18 years ago   HIATAL HERNIA REPAIR  09/14/2015   Procedure: LAPAROSCOPIC REPAIR OF HIATAL HERNIA;  Surgeon: Greer Pickerel, MD;  Location: [facility];  Service: General;;   LAPAROSCOPIC GASTRIC SLEEVE RESECTION N/A 09/14/2015   Procedure: LAPAROSCOPIC GASTRIC SLEEVE RESECTION W/UPPER ENDO;  Surgeon: Greer Pickerel, MD;  Location: [facility];  Service: General;  Laterality: N/A;   uterine ablation  08/2010    Social History  Tobacco Use   Smoking status: Former Smoker    Types: Cigarettes    Quit date: 07/27/2015    Years since quitting: 5.1   Smokeless tobacco: Never Used  Substance Use Topics   Alcohol use: Yes    Alcohol/week: 4.0 standard drinks    Types: 4 Glasses of wine per week   Drug use: No    Family History  Problem Relation Age of Onset   Heart disease Mother    Cervical cancer Mother    High blood pressure Mother    High Cholesterol Mother    Sudden death Mother    Other Father    Diabetes Other        [place]   Heart disease Other        female >56, [place]   Pneumonia Sister    Heart disease Sister    Healthy Sister    Colon cancer Neg Hx     No Known Allergies  Medication list has been reviewed and updated.  Current  Outpatient Medications on File Prior to Visit  Medication Sig Dispense Refill   amLODipine (NORVASC) 10 MG tablet Take 1 tablet (10 mg total) by mouth daily. 90 tablet 3   calcium citrate-vitamin D (CITRACAL+D) 315-200 MG-UNIT tablet Take 1 tablet by mouth 2 (two) times daily.     cyanocobalamin 500 MCG tablet Take 500 mcg by mouth daily.     cyclobenzaprine (FLEXERIL) 10 MG tablet TAKE 1 TABLET BY MOUTH EVERYDAY AT BEDTIME     fluocinonide ointment (LIDEX) 5.40 % Apply 1 application topically 2 (two) times daily. Use as needed for palm. 30 g 1   furosemide (LASIX) 20 MG tablet Take 1 tablet (20 mg total) by mouth daily. 90 tablet 3   ibuprofen (ADVIL) 800 MG tablet Take 800 mg by mouth 3 (three) times daily as needed.     lovastatin (MEVACOR) 20 MG tablet Take 1 tablet (20 mg total) by mouth at bedtime. 90 tablet 3   metFORMIN (GLUCOPHAGE) 500 MG tablet TAKE 1 TABLET BY MOUTH EVERY DAY WITH BREAKFAST 90 tablet 3   Multiple Vitamins-Minerals (CELEBRATE MULTI-COMPLETE 18) CHEW Chew by mouth daily.     sertraline (ZOLOFT) 100 MG tablet Take 1 tablet (100 mg total) by mouth daily. 90 tablet 3   traZODone (DESYREL) 50 MG tablet Take 0.5-1 tablets (25-50 mg total) by mouth at bedtime as needed for sleep. 90 tablet 3   Current Facility-Administered Medications on File Prior to Visit  Medication Dose Route Frequency Provider Last Rate Last Admin   cyclobenzaprine (FLEXERIL) tablet 5 mg  5 mg Oral TID PRN Meredith Pel, MD        Review of Systems:  As per HPI- otherwise negative.   Physical Examination: There were no vitals filed for this visit. There were no vitals filed for this visit. There is no height or weight on file to calculate BMI. Ideal Body Weight:    Pt observed via video monitor She looks well and her normal self She is able to move her arms over her head and rotate her neck normally   Assessment and Plan: Muscle pain - Plan: methocarbamol (ROBAXIN) 500  MG tablet  Virtual visit today for concern of muscle pain pt has noted since she had a fall 3 weeks ago Explained that a virtual visit is not the ideal modality to evaluate this concern She is scheduled to see me in person on Monday rx robaxin to her to use as  needed for muscle soreness as needed- use this or flexeril, not both  She is actively pursing a gyn consult for her concern of vaginal fecal material She agrees to seek care in the meantime if she is worsening  Video used for entirely of visit today   This visit occurred during the SARS-CoV-2 public health emergency.  Safety protocols were in place, including screening questions prior to the visit, additional usage of staff PPE, and extensive cleaning of exam room while observing appropriate contact time as indicated for disinfecting solutions.      Signed Lamar Blinks, MD

## 2020-09-17 ENCOUNTER — Encounter: Payer: Self-pay | Admitting: Family Medicine

## 2020-09-17 ENCOUNTER — Other Ambulatory Visit: Payer: Self-pay | Admitting: Obstetrics & Gynecology

## 2020-09-17 ENCOUNTER — Telehealth (INDEPENDENT_AMBULATORY_CARE_PROVIDER_SITE_OTHER): Payer: 59 | Admitting: Family Medicine

## 2020-09-17 ENCOUNTER — Other Ambulatory Visit: Payer: Self-pay

## 2020-09-17 ENCOUNTER — Telehealth: Payer: Self-pay

## 2020-09-17 DIAGNOSIS — R152 Fecal urgency: Secondary | ICD-10-CM

## 2020-09-17 DIAGNOSIS — M791 Myalgia, unspecified site: Secondary | ICD-10-CM

## 2020-09-17 DIAGNOSIS — R159 Full incontinence of feces: Secondary | ICD-10-CM

## 2020-09-17 MED ORDER — DIPHENOXYLATE-ATROPINE 2.5-0.025 MG PO TABS: 1.0000 | ORAL_TABLET | Freq: Four times a day (QID) | ORAL | 0 refills | Status: DC | PRN

## 2020-09-17 MED ORDER — METHOCARBAMOL 500 MG PO TABS: 500.0000 mg | ORAL_TABLET | Freq: Three times a day (TID) | ORAL | 0 refills | Status: DC | PRN

## 2020-09-17 NOTE — Telephone Encounter (Signed)
Patient called and wondering about her scripts sent in.   Reviewed chart and patient sent meesage to Dr. Ihor Dow about leaking stool in her vagina. Per Ihor Dow she was added to Mondays schedule and told about her medication sent in. Kathrene Alu RN

## 2020-09-19 NOTE — Patient Instructions (Addendum)
It was great to see you again today, I will be in touch with your A1c and other labs as soon as possible I would try a massage for your muscle soreness if you like You can take robaxin 500 -1000 mg every 6 hours as needed if you like- please let me know if you need a refill  Take care, assuming all is ok we can recheck in 6 months

## 2020-09-19 NOTE — Progress Notes (Addendum)
[place] at [facility] [address], [place], [place] [address] [phone]  Date:  09/21/2020   Name:  Misty Bell   DOB:  [birth year]   MRN:  [id number]  PCP:  Darreld Mclean, MD    Chief Complaint: Fall (Soreness, body aches)   History of Present Illness:  Misty Bell is a 65 y.o. very pleasant female patient who presents with the following:  Patient here today for follow-up.  We did a virtual visit earlier this year, she fell about 3 weeks ago and was having residual soreness of her upper body History of diabetes, hypertension, obesity, status post lap band She is in MSW school through [employer]  Per notes from 3/24: Pt notes that she fell about 3 weeks ago She was getting out of her car and went to step up on the curb.  She lost her balance and fell forward onto her hands and "flat on the ground" She hit her face on the ground.   Her arms and neck are still sore She has flexeril and ibuprofen but this does not seem to be helping  Pt notes that she was supposed to be seen in person today but she is having fecal accidents and had an accident at work and had to go back home She is having fecal matter coming from her vagina she thinks.  She had this issue years ago after an episiotomy about 30 years ago, her 3rd baby  I prescribed Robaxin as Flexeril was not helping her The robaxin seems to be helping her some, but she thinks we might need to increase the dose which we can do -she is currently only taking 500 3 times daily She was seen by her GYN, Dr. Ihor Dow earlier this morning They plan to have her see urogyn and probably have surgery to repair the defect which is causing vaginal defecation  Lab Results  Component Value Date   HGBA1C 7.1 (H) 05/28/2020   Can update A1c today Most recent colonoscopy 2020  Patient Active Problem List   Diagnosis Date Noted  . Chronic venous insufficiency  04/02/2019  . Other fatigue 09/25/2017  . Shortness of breath on exertion 09/25/2017  . Status post gastric banding 09/25/2017  . Hypercholesterolemia 09/14/2015  . Obesity, Class III, BMI 40-49.9 (morbid obesity) ([place]) 09/14/2015  . Hiatal hernia 09/14/2015  . Osteoarthritis of both knees 09/14/2015  . DOE (dyspnea on exertion) 09/02/2015  . Anxiety disorder 04/01/2015  . Back pain 04/01/2015  . Diabetes mellitus type 2 in obese ([place]) 04/01/2015  . Essential hypertension 04/01/2015  . Migraine 04/01/2015  . Spider nevus of skin 10/29/2012    Past Medical History:  Diagnosis Date  . Anxiety   . Anxiety disorder   . Back pain   . Diabetes mellitus type 2 in obese ([facility])    pre-diabetic  . Hypertension 2010  . Knee pain   . Migraine   . Primary osteoarthritis of both knees   . Snoring   . Varicose veins     Past Surgical History:  Procedure Laterality Date  . COLONOSCOPY    . DILATION AND CURETTAGE OF UTERUS     years ago-several  . FRACTURE SURGERY     neck/ 18 years ago  . HIATAL HERNIA REPAIR  09/14/2015   Procedure: LAPAROSCOPIC REPAIR OF HIATAL HERNIA;  Surgeon: Greer Pickerel, MD;  Location: [facility];  Service: General;;  . LAPAROSCOPIC  GASTRIC SLEEVE RESECTION N/A 09/14/2015   Procedure: LAPAROSCOPIC GASTRIC SLEEVE RESECTION W/UPPER ENDO;  Surgeon: Greer Pickerel, MD;  Location: [facility];  Service: General;  Laterality: N/A;  . uterine ablation  08/2010    Social History   Tobacco Use  . Smoking status: Former Smoker    Types: Cigarettes    Quit date: 07/27/2015    Years since quitting: 5.1  . Smokeless tobacco: Never Used  Substance Use Topics  . Alcohol use: Yes    Alcohol/week: 4.0 standard drinks    Types: 4 Glasses of wine per week  . Drug use: No    Family History  Problem Relation Age of Onset  . Heart disease Mother   . Cervical cancer Mother   . High blood pressure Mother   . High Cholesterol Mother   . Sudden death Mother   . Other Father   .  Diabetes Other        [place]  . Heart disease Other        female >2, [facility]  . Pneumonia Sister   . Heart disease Sister   . Healthy Sister   . Colon cancer Neg Hx     No Known Allergies  Medication list has been reviewed and updated.  Current Outpatient Medications on File Prior to Visit  Medication Sig Dispense Refill  . amLODipine (NORVASC) 10 MG tablet Take 1 tablet (10 mg total) by mouth daily. 90 tablet 3  . calcium citrate-vitamin D (CITRACAL+D) 315-200 MG-UNIT tablet Take 1 tablet by mouth 2 (two) times daily.    . cyanocobalamin 500 MCG tablet Take 500 mcg by mouth daily.    . diphenoxylate-atropine (LOMOTIL) 2.5-0.025 MG tablet Take 1 tablet by mouth 4 (four) times daily as needed for diarrhea or loose stools. 30 tablet 0  . fluocinonide ointment (LIDEX) 1.30 % Apply 1 application topically 2 (two) times daily. Use as needed for palm. 30 g 1  . furosemide (LASIX) 20 MG tablet Take 1 tablet (20 mg total) by mouth daily. 90 tablet 3  . ibuprofen (ADVIL) 800 MG tablet Take 800 mg by mouth 3 (three) times daily as needed.    . lovastatin (MEVACOR) 20 MG tablet Take 1 tablet (20 mg total) by mouth at bedtime. 90 tablet 3  . metFORMIN (GLUCOPHAGE) 500 MG tablet TAKE 1 TABLET BY MOUTH EVERY DAY WITH BREAKFAST 90 tablet 3  . methocarbamol (ROBAXIN) 500 MG tablet Take 1 tablet (500 mg total) by mouth every 8 (eight) hours as needed for muscle spasms. 30 tablet 0  . Multiple Vitamins-Minerals (CELEBRATE MULTI-COMPLETE 18) CHEW Chew by mouth daily.    . sertraline (ZOLOFT) 100 MG tablet Take 1 tablet (100 mg total) by mouth daily. 90 tablet 3  . traZODone (DESYREL) 50 MG tablet Take 0.5-1 tablets (25-50 mg total) by mouth at bedtime as needed for sleep. 90 tablet 3   Current Facility-Administered Medications on File Prior to Visit  Medication Dose Route Frequency Provider Last Rate Last Admin  . cyclobenzaprine (FLEXERIL) tablet 5 mg  5 mg Oral TID PRN Meredith Pel, MD         Review of Systems:  As per HPI- otherwise negative.   Physical Examination: Vitals:   09/21/20 1139  BP: 128/82  Pulse: 88  Resp: 18  Temp: 98.1 F (36.7 C)  SpO2: 98%   Vitals:   09/21/20 1139  Weight: 202 lb (91.6 kg)  Height: 5\' 2"  (1.575 m)   Body mass index  is 36.95 kg/m. Ideal Body Weight: Weight in (lb) to have BMI = 25: 136.4  GEN: no acute distress.  Obese, looks well otherwise  HEENT: Atraumatic, Normocephalic.   Bilateral TM wnl, oropharynx normal.  PEERL,EOMI.   Ears and Nose: No external deformity. CV: RRR, No M/G/R. No JVD. No thrill. No extra heart sounds. PULM: CTA B, no wheezes, crackles, rhonchi. No retractions. No resp. distress. No accessory muscle use. ABD: S, NT, ND, +BS. No rebound. No HSM. EXTR: No c/c/e PSYCH: Normally interactive. Conversant.  She has normal strength of both upper extremities, normal range of motion of her neck and bilateral shoulders.  She notes that she has soreness in the muscles of her upper back, specifically in her trapezius muscle   Assessment and Plan: Controlled type 2 diabetes mellitus without complication, without long-term current use of insulin ([place]) - Plan: Hemoglobin A1c  Muscle pain - Plan: CK, Sedimentation rate, Basic metabolic panel Patient today with a couple of concerns.  A1c needed today, will update She also notes muscle pains which started when she fell about 3 weeks ago.  I suspect this is simply muscle soreness, but we will check a CK, sed rate and BMP today.  I gave her instructions for increasing her dose of Robaxin if she would like.  She will let me know if not feeling better soon, otherwise we can plan to visit in about 6 months Will plan further follow- up pending labs.  This visit occurred during the SARS-CoV-2 public health emergency.  Safety protocols were in place, including screening questions prior to the visit, additional usage of staff PPE, and extensive cleaning of exam room while  observing appropriate contact time as indicated for disinfecting solutions.    Signed Lamar Blinks, MD  Received her labs as below, message to pt  Results for orders placed or performed in visit on 09/21/20  Hemoglobin A1c  Result Value Ref Range   Hgb A1c MFr Bld 7.3 (H) 4.6 - 6.5 %  CK  Result Value Ref Range   Total CK 60 7 - 177 U/L  Sedimentation rate  Result Value Ref Range   Sed Rate 15 0 - 30 mm/hr  Basic metabolic panel  Result Value Ref Range   Sodium 139 135 - 145 mEq/L   Potassium 3.4 (L) 3.5 - 5.1 mEq/L   Chloride 101 96 - 112 mEq/L   CO2 31 19 - 32 mEq/L   Glucose, Bld 163 (H) 70 - 99 mg/dL   BUN 9 6 - 23 mg/dL   Creatinine, Ser 0.58 0.40 - 1.20 mg/dL   GFR 96.78 >60.00 mL/min   Calcium 9.1 8.4 - 10.5 mg/dL

## 2020-09-21 ENCOUNTER — Encounter: Payer: Self-pay | Admitting: Family Medicine

## 2020-09-21 ENCOUNTER — Ambulatory Visit (INDEPENDENT_AMBULATORY_CARE_PROVIDER_SITE_OTHER): Payer: 59 | Admitting: Obstetrics & Gynecology

## 2020-09-21 ENCOUNTER — Ambulatory Visit (INDEPENDENT_AMBULATORY_CARE_PROVIDER_SITE_OTHER): Payer: 59 | Admitting: Family Medicine

## 2020-09-21 ENCOUNTER — Other Ambulatory Visit: Payer: Self-pay

## 2020-09-21 ENCOUNTER — Encounter: Payer: Self-pay | Admitting: Obstetrics & Gynecology

## 2020-09-21 VITALS — BP 132/73 | HR 83 | Ht 62.0 in | Wt 202.0 lb

## 2020-09-21 VITALS — BP 128/82 | HR 88 | Temp 98.1°F | Resp 18 | Ht 62.0 in | Wt 202.0 lb

## 2020-09-21 DIAGNOSIS — E119 Type 2 diabetes mellitus without complications: Secondary | ICD-10-CM | POA: Diagnosis not present

## 2020-09-21 DIAGNOSIS — M791 Myalgia, unspecified site: Secondary | ICD-10-CM | POA: Diagnosis not present

## 2020-09-21 DIAGNOSIS — N823 Fistula of vagina to large intestine: Secondary | ICD-10-CM

## 2020-09-21 LAB — BASIC METABOLIC PANEL
BUN: 9 mg/dL (ref 6–23)
CO2: 31 mEq/L (ref 19–32)
Calcium: 9.1 mg/dL (ref 8.4–10.5)
Chloride: 101 mEq/L (ref 96–112)
Creatinine, Ser: 0.58 mg/dL (ref 0.40–1.20)
GFR: 96.78 mL/min (ref >60.00)
Glucose, Bld: 163 mg/dL — ABNORMAL HIGH (ref 70–99)
Potassium: 3.4 mEq/L — ABNORMAL LOW (ref 3.5–5.1)
Sodium: 139 mEq/L (ref 135–145)

## 2020-09-21 LAB — HEMOGLOBIN A1C: Hgb A1c MFr Bld: 7.3 % — ABNORMAL HIGH (ref 4.6–6.5)

## 2020-09-21 LAB — SEDIMENTATION RATE: Sed Rate: 15 mm/hr (ref 0–30)

## 2020-09-21 LAB — CK: Total CK: 60 U/L (ref 7–177)

## 2020-09-21 NOTE — Progress Notes (Signed)
History:  65 y.o. G4P3 here today for eval of stool coming from vagina.  Pt reports that >30 years ago following the birth of her son, she had a complicated episiotomy repair. She reports that it broke down and was repaired. She denies further problems until about 2 months ago. She started to experience passage of gas from the vagina. Recently, she had diarrhea and as she was trying to hold in the stool she realized that it was coming out through her vagina. She is very upset abut this. She denies assoc sx. No abnormal discharge. The diarrhea has resolved as have the vaginal sx. She is s/p colonoscopy 01/2019.  The following portions of the patient's history were reviewed and updated as appropriate: allergies, current medications, past family history, past medical history, past social history, past surgical history and problem list.  Review of Systems:  Pertinent items are noted in HPI.    Objective:  Physical Exam Blood pressure 132/73, pulse 83, height 5\' 2"  (1.575 m), weight 202 lb (91.6 kg).  CONSTITUTIONAL: Well-developed, well-nourished female in no acute distress.  HENT:  Normocephalic, atraumatic EYES: Conjunctivae and EOM are normal. No scleral icterus.  NECK: Normal range of motion SKIN: Skin is warm and dry. No rash noted. Not diaphoretic.No pallor. [place]: Alert and oriented to person, place, and time. Normal coordination.  Abd: Soft, nontender and nondistended Pelvic: Normal appearing external genitalia; at the low 4th of the vagina about 2cm from the introitus, a small midline discolored defect was noted. There was no active material in the defect but, I was able to probe it with the wooden end of a cotton swab. I could get the end of the probed in but, did not attempt to push it all the way through. The rest of the vaginal mucosa and cervix were wnl. Normal discharge. A rectal was done and although the mucosa was intact, it was lax. I did not probe the fistula tact from the rectal  side. Small uterus, no other palpable masses, no uterine or adnexal tenderness  Assessment & Plan:  Vaginal rectal fistula. It is very unusual to see a breakdown of an episiotomy site this far out from the injury. Reviewed pts concerns of risks of infection and hygiene.     Referral to UroGYN, Dr. Sherlene Shams.   Case presented to Dr. Wannetta Sender.   Total face-to-face time with patient, review of chart, discussion with consultant and coordination of care was 29min.    Jaclyn Andy L. Harraway-Smith, M.D., [name]

## 2020-09-23 ENCOUNTER — Ambulatory Visit: Payer: 59 | Admitting: Family Medicine

## 2020-09-29 ENCOUNTER — Ambulatory Visit: Payer: 59 | Admitting: Obstetrics and Gynecology

## 2020-11-06 ENCOUNTER — Ambulatory Visit: Payer: 59 | Admitting: Obstetrics and Gynecology

## 2021-01-07 ENCOUNTER — Other Ambulatory Visit: Payer: Self-pay | Admitting: Family Medicine

## 2021-01-07 ENCOUNTER — Encounter: Payer: Self-pay | Admitting: Family Medicine

## 2021-01-07 DIAGNOSIS — Z720 Tobacco use: Secondary | ICD-10-CM

## 2021-01-07 MED ORDER — NICOTINE POLACRILEX 4 MG MT GUM: 4.0000 mg | CHEWING_GUM | OROMUCOSAL | 0 refills | Status: DC | PRN

## 2021-01-12 NOTE — Progress Notes (Signed)
Misty Bell  Referring Provider: Lavonia Drafts* PCP: Darreld Mclean, MD Date of Service: 01/13/2021  SUBJECTIVE Chief Complaint: New Patient (Initial Visit)- fistula  History of Present Illness: Misty Bell is a 65 y.o. Black or African-American female seen in [place] at the request of Dr. Ihor Dow for evaluation of rectovaginal fistula.    Review of records significant for: Has a history of episiotomy repair and breakdown 30 years ago. A few months ago started passing gas from the vagina. Noted stool from the vagina when her stool was loose. Had colonoscopy 01/2019.  Urinary Symptoms: Leaks urine with with urgency Leaks 1 time(s) per days.  Pad use: liners/ mini-pads  She is bothered by her UI symptoms.  Day time voids 3-4.  Nocturia: 1-2 times per night to void. Voiding dysfunction: she does not empty her bladder well.  does not use a catheter to empty bladder.  When urinating, she feels a weak stream   UTIs:  0  UTI's in the last year.   Denies history of blood in urine and kidney or bladder stones  Pelvic Organ Prolapse Symptoms:                  She Denies a feeling of a bulge the vaginal area.   Bowel Symptom: Bowel movements: 1-2 time(s) per day Stool consistency: hard or soft  Straining: yes.  Splinting: yes.  Incomplete evacuation: yes.  She Admits to accidental bowel leakage / fecal incontinence  Consistency with leakage: liquid Took some flax seed oil that cause liquid stool and then the stool was running out, noticed it was coming from the vagina. Last time this happened was last week. Otherwise she denies frequent stool changes, or blood in the stool.  Bowel regimen: miralax Last colonoscopy: Date 01/2019, Results: negative  Sexual Function Sexually active: yes.  Sexual orientation:  heterosexual Pain with sex: No  Pelvic Pain Denies pelvic pain    Past Medical  History:  Past Medical History:  Diagnosis Date   Anxiety    Anxiety disorder    Back pain    Diabetes mellitus type 2 in obese ([place])    pre-diabetic   Hypertension 2010   Knee pain    Migraine    Primary osteoarthritis of both knees    Snoring    Varicose veins      Past Surgical History:   Past Surgical History:  Procedure Laterality Date   COLONOSCOPY     DILATION AND CURETTAGE OF UTERUS     years ago-several   FRACTURE SURGERY     neck/ 18 years ago   HIATAL HERNIA REPAIR  09/14/2015   Procedure: LAPAROSCOPIC REPAIR OF HIATAL HERNIA;  Surgeon: Greer Pickerel, MD;  Location: [facility];  Service: General;;   LAPAROSCOPIC GASTRIC SLEEVE RESECTION N/A 09/14/2015   Procedure: LAPAROSCOPIC GASTRIC SLEEVE RESECTION W/UPPER ENDO;  Surgeon: Greer Pickerel, MD;  Location: [facility];  Service: General;  Laterality: N/A;   uterine ablation  08/2010     Past OB/GYN History: G4 P3 Vaginal deliveries: 3,  Forceps/ Vacuum deliveries: 0, Cesarean section: 0 Menopausal: Yes- had a uterine ablation Last pap smear was 11/2019- negative.   Had an episiotomy with her last child in 65. Several months after she noticed stool from the vagina and had a repair in the operating room.   Medications: She has a current medication list which includes the following prescription(s): amlodipine, calcium citrate-vitamin d, vitamin b-12, fluocinonide ointment,  furosemide, ibuprofen, lovastatin, metformin, celebrate multi-complete 18, nicotine polacrilex, and sertraline, and the following Facility-Administered Medications: cyclobenzaprine.   Allergies: Patient has No Known Allergies.   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  Regular exercise: Yes: walking, core exercises  Family History:   Family History  Problem Relation Age of Onset   Heart disease Mother    Cervical cancer Mother    High blood pressure Mother    High Cholesterol Mother    Sudden death Mother    Other Father    Diabetes Other        [place]   Heart disease Other        female >36, [place]   Pneumonia Sister    Heart disease Sister    Healthy Sister    Colon cancer Neg Hx      Review of Systems: Review of Systems  Constitutional:  Negative for fever, malaise/fatigue and weight loss.  Respiratory:  Negative for cough, shortness of breath and wheezing.   Cardiovascular:  Negative for chest pain, palpitations and leg swelling.  Gastrointestinal:  Negative for abdominal pain and blood in stool.  Genitourinary:  Negative for dysuria.  Musculoskeletal:  Negative for myalgias.  Skin:  Negative for rash.  Neurological:  Positive for headaches. Negative for dizziness.  Endo/Heme/Allergies:  Bruises/bleeds easily.       + hot flashes  Psychiatric/Behavioral:  Negative for depression. The patient is nervous/anxious.     OBJECTIVE Physical Exam: Vitals:   01/13/21 0905  BP: 134/87  Pulse: (!) 102  Height: 5\' 2"  (1.575 m)    Physical Exam Constitutional:      General: She is not in acute distress. Pulmonary:     Effort: Pulmonary effort is normal.  Abdominal:     General: There is no distension.     Palpations: Abdomen is soft.     Tenderness: There is no abdominal tenderness. There is no rebound.  Musculoskeletal:        General: No swelling. Normal range of motion.  Skin:    General: Skin is warm and dry.     Findings: No rash.  Neurological:     Mental Status: She is alert and oriented to person, place, and time.  Psychiatric:        Mood and Affect: Mood normal.        Behavior: Behavior normal.      GU / Detailed Urogynecologic Evaluation:  Pelvic Exam: Normal external female genitalia; Bartholin's and Skene's glands normal in appearance; urethral meatus normal in appearance, no urethral masses or discharge.   CST: negative  Speculum exam reveals normal vaginal mucosa with atrophy. Cervix normal appearance. Uterus normal single, nontender. Adnexa normal adnexa.  Small subcentimeter divot noted half cm from the introitus at the midline. Rectal exam was performed and probe was inserted through the vagina- could not feel a connection from the divot to the rectum.    Pelvic floor strength IV/V  Pelvic floor musculature: Right  levator non-tender, Right obturator non-tender, Left levator non-tender, Left obturator non-tender  POP-Q:   POP-Q  -2                                            Aa   -2                                           Ba  -6                                              C   2                                            Gh  3                                            Pb  9                                            tvl   -2.5                                            Ap  -2.5                                            Bp  -8                                              D     Rectal Exam:  Normal sphincter tone, no distal rectocele, enterocoele not present, no rectal masses See above for fistula assessment  Post-Void Residual (PVR) by Bladder Scan: In order to evaluate bladder emptying, we discussed obtaining a postvoid residual and she agreed to this procedure.  Procedure: The ultrasound unit was placed on the patient's abdomen in the suprapubic region after the patient had voided. A PVR of 1 ml was obtained by bladder scan.  Laboratory Results: POC urine: + glucose  I visualized the urine specimen, noting the specimen to be clear yellow  ASSESSMENT AND PLAN Misty Bell is a 65 y.o. with:  1. Rectovaginal fistula   2. Urge incontinence     RVF - could not distinctly identify a fistula on today's exam with probing of divot in vagina. Therefore, will plan for an exam under anesthesia where we will place methylene blue in the rectum to identify the fistulous tract. If identified,  will then proceed with a rectovaginal fistula repair with a perineorrhaphy.  -We discussed risks of bleeding, infection, damage to surrounding organs including bowel,  blood vessels, and nerves, need for further surgery, numbness and weakness at any body site, buttock pain, postoperative cognitive dysfunction, and the rarer risks of blood clot, heart attack, pneumonia, death. All her questions were answered and she verbalized understanding.   - We discussed the importance of keeping her stool soft post-operatively so she is not straining. Will also need a pre-operative bowel prep.  - She is an occasional smoker and a diabetic which places her at higher risk for infection and failure of repair. Patient states she will stop smoking immediately. Last a1c on 09/21/20 was 7.3 and will plan to repeat prior to surgery to ensure it is <8.  - Will proceed with surgery scheduling.   2. Urge incontinence - not as bothersome as fistula symptoms so will readdress treatment options at a later date.     [name], MD   Medical Decision Making:  - Reviewed/ ordered a clinical laboratory test - Review and summation of prior records

## 2021-01-13 ENCOUNTER — Other Ambulatory Visit: Payer: Self-pay

## 2021-01-13 ENCOUNTER — Encounter: Payer: Self-pay | Admitting: Obstetrics and Gynecology

## 2021-01-13 ENCOUNTER — Ambulatory Visit (INDEPENDENT_AMBULATORY_CARE_PROVIDER_SITE_OTHER): Payer: 59 | Admitting: Obstetrics and Gynecology

## 2021-01-13 VITALS — BP 134/87 | HR 102 | Ht 62.0 in

## 2021-01-13 DIAGNOSIS — N823 Fistula of vagina to large intestine: Secondary | ICD-10-CM

## 2021-01-13 DIAGNOSIS — N3941 Urge incontinence: Secondary | ICD-10-CM | POA: Diagnosis not present

## 2021-01-13 LAB — POCT URINALYSIS DIPSTICK
Appearance: NORMAL
Bilirubin, UA: NEGATIVE
Blood, UA: NEGATIVE
Glucose, UA: POSITIVE — AB
Ketones, UA: NEGATIVE
Leukocytes, UA: NEGATIVE
Nitrite, UA: NEGATIVE
Protein, UA: NEGATIVE
Spec Grav, UA: 1.01 (ref 1.010–1.025)
Urobilinogen, UA: 0.2 E.U./dL
pH, UA: 5 (ref 5.0–8.0)

## 2021-01-15 ENCOUNTER — Ambulatory Visit: Payer: 59 | Admitting: Obstetrics and Gynecology

## 2021-03-06 ENCOUNTER — Other Ambulatory Visit: Payer: Self-pay | Admitting: Family Medicine

## 2021-03-06 DIAGNOSIS — R7303 Prediabetes: Secondary | ICD-10-CM

## 2021-03-06 DIAGNOSIS — E119 Type 2 diabetes mellitus without complications: Secondary | ICD-10-CM

## 2021-03-06 DIAGNOSIS — E785 Hyperlipidemia, unspecified: Secondary | ICD-10-CM

## 2021-03-08 ENCOUNTER — Other Ambulatory Visit: Payer: Self-pay | Admitting: Family Medicine

## 2021-03-08 ENCOUNTER — Encounter: Payer: 59 | Admitting: Obstetrics and Gynecology

## 2021-03-08 DIAGNOSIS — R600 Localized edema: Secondary | ICD-10-CM

## 2021-03-11 ENCOUNTER — Other Ambulatory Visit: Payer: Self-pay

## 2021-03-11 ENCOUNTER — Encounter: Payer: Self-pay | Admitting: Obstetrics and Gynecology

## 2021-03-11 ENCOUNTER — Ambulatory Visit (INDEPENDENT_AMBULATORY_CARE_PROVIDER_SITE_OTHER): Payer: 59 | Admitting: Obstetrics and Gynecology

## 2021-03-11 VITALS — BP 125/81 | HR 90 | Wt 198.0 lb

## 2021-03-11 DIAGNOSIS — Z01818 Encounter for other preprocedural examination: Secondary | ICD-10-CM

## 2021-03-11 LAB — CBC
Hematocrit: 42 % (ref 34.0–46.6)
Hemoglobin: 14.1 g/dL (ref 11.1–15.9)
MCH: 29.3 pg (ref 26.6–33.0)
MCHC: 33.6 g/dL (ref 31.5–35.7)
MCV: 87 fL (ref 79–97)
Platelets: 353 10*3/uL (ref 150–450)
RBC: 4.81 x10E6/uL (ref 3.77–5.28)
RDW: 13.4 % (ref 11.7–15.4)
WBC: 11.3 10*3/uL — ABNORMAL HIGH (ref 3.4–10.8)

## 2021-03-11 NOTE — Progress Notes (Signed)
[facility] Pre-Operative H&P  Subjective Chief Complaint: DEVANSHI Bell presents for a preoperative encounter.   History of Present Illness: Misty Bell is a 65 y.o. female who presents for preoperative visit.  She is scheduled to undergo rectovaginal fistula repair and perineoplasty on 04/01/21.  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 Not clear fistula on exam although pinpoint area noted.    Past Medical History:  Diagnosis Date   Anxiety    Anxiety disorder    Back pain    Diabetes mellitus type 2 in obese ([place])    pre-diabetic   Hypertension 2010   Knee pain    Migraine    Primary osteoarthritis of both knees    Snoring    Varicose veins      Past Surgical History:  Procedure Laterality Date   COLONOSCOPY     DILATION AND CURETTAGE OF UTERUS     years ago-several   FRACTURE SURGERY     neck/ 18 years ago   HIATAL HERNIA REPAIR  09/14/2015   Procedure: LAPAROSCOPIC REPAIR OF HIATAL HERNIA;  Surgeon: Greer Pickerel, MD;  Location: [facility];  Service: General;;   LAPAROSCOPIC GASTRIC SLEEVE RESECTION N/A 09/14/2015   Procedure: LAPAROSCOPIC GASTRIC SLEEVE RESECTION W/UPPER ENDO;  Surgeon: Greer Pickerel, MD;  Location: [facility];  Service: General;  Laterality: N/A;   uterine ablation  08/2010    has No Known Allergies.   Family History  Problem Relation Age of Onset   Heart disease Mother    Cervical cancer Mother    High blood pressure Mother    High Cholesterol Mother    Sudden death Mother    Other Father    Diabetes Other        [place]   Heart disease Other        female >19, [place]   Pneumonia Sister    Heart disease Sister    Healthy Sister    Colon cancer Neg Hx     Social History   Tobacco Use   Smoking status: Some Days    Types: Cigarettes    Last attempt to quit: 07/27/2015    Years since quitting: 5.6   Smokeless tobacco: Never  Vaping Use   Vaping Use: Never used   Substance Use Topics   Alcohol use: Yes    Alcohol/week: 4.0 standard drinks    Types: 4 Glasses of wine per week   Drug use: No     Review of Systems was negative for a full 10 system review except as noted in the History of Present Illness.   Current Outpatient Medications:    amLODipine (NORVASC) 10 MG tablet, TAKE 1 TABLET BY MOUTH EVERY DAY, Disp: 90 tablet, Rfl: 1   lovastatin (MEVACOR) 20 MG tablet, TAKE 1 TABLET BY MOUTH EVERYDAY AT BEDTIME, Disp: 90 tablet, Rfl: 1   metFORMIN (GLUCOPHAGE) 500 MG tablet, TAKE 1 TABLET BY MOUTH EVERY DAY WITH BREAKFAST, Disp: 90 tablet, Rfl: 1   calcium citrate-vitamin D (CITRACAL+D) 315-200 MG-UNIT tablet, Take 1 tablet by mouth 2 (two) times daily., Disp: , Rfl:    cyanocobalamin 500 MCG tablet, Take 500 mcg by mouth daily., Disp: , Rfl:    fluocinonide ointment (LIDEX) AB-123456789 %, Apply 1 application topically 2 (two) times daily. Use as needed for palm., Disp: 30 g, Rfl: 1   furosemide (LASIX) 20 MG tablet, TAKE 1 TABLET BY MOUTH EVERY DAY, Disp: 90  tablet, Rfl: 3   ibuprofen (ADVIL) 800 MG tablet, Take 800 mg by mouth 3 (three) times daily as needed., Disp: , Rfl:    Multiple Vitamins-Minerals (CELEBRATE MULTI-COMPLETE 18) CHEW, Chew by mouth daily., Disp: , Rfl:    nicotine polacrilex (NICORETTE) 4 MG gum, Take 1 each (4 mg total) by mouth as needed for smoking cessation., Disp: 100 tablet, Rfl: 0   sertraline (ZOLOFT) 100 MG tablet, Take 1 tablet (100 mg total) by mouth daily., Disp: 90 tablet, Rfl: 3  Current Facility-Administered Medications:    cyclobenzaprine (FLEXERIL) tablet 5 mg, 5 mg, Oral, TID PRN, Marlou Sa Tonna Corner, MD   Objective Vitals:   03/11/21 1528  BP: 125/81  Pulse: 90   Gen: No distress, AAO x3  Previous Pelvic Exam showed: Speculum exam reveals normal vaginal mucosa with atrophy. Cervix normal appearance. Uterus normal single, nontender. Adnexa normal adnexa.  Small subcentimeter divot noted half cm from the  introitus at the midline. Rectal exam was performed and probe was inserted through the vagina- could not feel a connection from the divot to the rectum.     Assessment/ Plan  Assessment: The patient is a 65 y.o. year old scheduled to undergo rectovaginal fistula repair and perineoplasty. Verbal consent was obtained for these procedures.  Plan: General Surgical Consent: The patient has previously been counseled on alternative treatments, and the decision by the patient and provider was to proceed with the procedure listed above.  For all procedures, there are risks of bleeding, infection, damage to surrounding organs including but not limited to bowel, bladder, blood vessels, ureters and nerves, and need for further surgery if an injury were to occur. These risks are all low with minimally invasive surgery.   There are risks of numbness and weakness at any body site or buttock/rectal pain.  It is possible that baseline pain can be worsened by surgery, either with or without mesh. If surgery is vaginal, there is also a low risk of possible conversion to laparoscopy or open abdominal incision where indicated. Very rare risks include blood transfusion, blood clot, heart attack, pneumonia, or death.    Pre-operative instructions:  She was instructed to not take Aspirin/NSAIDs x 7days prior to surgery.  Antibiotic prophylaxis was ordered as indicated. She will do a bowel prep by taking a bottle of magnesium citrate the day prior to surgery.    Post-operative instructions:  She was provided with specific post-operative instructions, including precautions and signs/symptoms for which we would recommend contacting us, in addition to daytime and after-hours contact phone numbers. This was provided on a handout.   Post-operative medications: Prescriptions for motrin, tylenol, miralax, and oxycodone were sent to her pharmacy. Discussed using ibuprofen and tylenol on a schedule to limit use of narcotics.    Laboratory testing:  We will check labs: CBC and A1c.   Preoperative clearance:  She does not require surgical clearance.    Post-operative follow-up:  A post-operative appointment will be made for 6 weeks from the date of surgery. If she needs a post-operative nurse visit for a voiding trial, that will be set up after she leaves the hospital.    Patient will call the clinic or use MyChart should anything change or any new issues arise.   [name], MD

## 2021-03-12 ENCOUNTER — Encounter: Payer: Self-pay | Admitting: Obstetrics and Gynecology

## 2021-03-12 ENCOUNTER — Other Ambulatory Visit: Payer: Self-pay | Admitting: Obstetrics and Gynecology

## 2021-03-12 ENCOUNTER — Encounter: Payer: 59 | Admitting: Obstetrics and Gynecology

## 2021-03-12 DIAGNOSIS — Z01818 Encounter for other preprocedural examination: Secondary | ICD-10-CM

## 2021-03-12 LAB — HEMOGLOBIN A1C
Est. average glucose Bld gHb Est-mCnc: 163 mg/dL
Hgb A1c MFr Bld: 7.3 % — ABNORMAL HIGH (ref 4.8–5.6)

## 2021-03-12 LAB — SPECIMEN STATUS REPORT

## 2021-03-12 MED ORDER — ACETAMINOPHEN 500 MG PO TABS: 500.0000 mg | ORAL_TABLET | Freq: Four times a day (QID) | ORAL | 0 refills | Status: DC | PRN

## 2021-03-12 MED ORDER — POLYETHYLENE GLYCOL 3350 17 GM/SCOOP PO POWD: 17.0000 g | Freq: Every day | ORAL | 0 refills | Status: DC

## 2021-03-12 MED ORDER — OXYCODONE HCL 5 MG PO TABS: 5.0000 mg | ORAL_TABLET | ORAL | 0 refills | Status: DC | PRN

## 2021-03-12 MED ORDER — MAGNESIUM CITRATE PO SOLN
1.0000 | Freq: Once | ORAL | 0 refills | Status: AC
Start: 2021-03-12 — End: 2021-03-12

## 2021-03-12 MED ORDER — IBUPROFEN 600 MG PO TABS: 600.0000 mg | ORAL_TABLET | Freq: Four times a day (QID) | ORAL | 0 refills | Status: DC | PRN

## 2021-03-13 NOTE — H&P (Signed)
[facility] Pre-Operative visit  Subjective Chief Complaint: Misty Bell presents for a preoperative encounter.   History of Present Illness: Misty Bell is a 65 y.o. female who presents for preoperative visit.  She is scheduled to undergo rectovaginal fistula repair and perineoplasty on 04/01/21.  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 No clear fistula on exam although pinpoint area noted.    Past Medical History:  Diagnosis Date   Anxiety    Anxiety disorder    Back pain    Diabetes mellitus type 2 in obese ([place])    pre-diabetic   Hypertension 2010   Knee pain    Migraine    Primary osteoarthritis of both knees    Snoring    Varicose veins      Past Surgical History:  Procedure Laterality Date   COLONOSCOPY     DILATION AND CURETTAGE OF UTERUS     years ago-several   FRACTURE SURGERY     neck/ 18 years ago   HIATAL HERNIA REPAIR  09/14/2015   Procedure: LAPAROSCOPIC REPAIR OF HIATAL HERNIA;  Surgeon: Greer Pickerel, MD;  Location: [facility];  Service: General;;   LAPAROSCOPIC GASTRIC SLEEVE RESECTION N/A 09/14/2015   Procedure: LAPAROSCOPIC GASTRIC SLEEVE RESECTION W/UPPER ENDO;  Surgeon: Greer Pickerel, MD;  Location: [facility];  Service: General;  Laterality: N/A;   uterine ablation  08/2010    has No Known Allergies.   Family History  Problem Relation Age of Onset   Heart disease Mother    Cervical cancer Mother    High blood pressure Mother    High Cholesterol Mother    Sudden death Mother    Other Father    Diabetes Other        [place]   Heart disease Other        female >56, [place]   Pneumonia Sister    Heart disease Sister    Healthy Sister    Colon cancer Neg Hx     Social History   Tobacco Use   Smoking status: Some Days    Types: Cigarettes    Last attempt to quit: 07/27/2015    Years since quitting: 5.6   Smokeless tobacco: Never  Vaping Use   Vaping Use: Never used   Substance Use Topics   Alcohol use: Yes    Alcohol/week: 4.0 standard drinks    Types: 4 Glasses of wine per week   Drug use: No     Review of Systems was negative for a full 10 system review except as noted in the History of Present Illness.   Current Facility-Administered Medications:    cyclobenzaprine (FLEXERIL) tablet 5 mg, 5 mg, Oral, TID PRN, Marlou Sa Tonna Corner, MD  Current Outpatient Medications:    acetaminophen (TYLENOL) 500 MG tablet, Take 1 tablet (500 mg total) by mouth every 6 (six) hours as needed (pain)., Disp: 30 tablet, Rfl: 0   amLODipine (NORVASC) 10 MG tablet, TAKE 1 TABLET BY MOUTH EVERY DAY, Disp: 90 tablet, Rfl: 1   calcium citrate-vitamin D (CITRACAL+D) 315-200 MG-UNIT tablet, Take 1 tablet by mouth 2 (two) times daily., Disp: , Rfl:    cyanocobalamin 500 MCG tablet, Take 500 mcg by mouth daily., Disp: , Rfl:    fluocinonide ointment (LIDEX) AB-123456789 %, Apply 1 application topically 2 (two) times daily. Use as needed for palm., Disp: 30 g, Rfl: 1   furosemide (LASIX) 20 MG tablet, TAKE 1  TABLET BY MOUTH EVERY DAY, Disp: 90 tablet, Rfl: 3   ibuprofen (ADVIL) 600 MG tablet, Take 1 tablet (600 mg total) by mouth every 6 (six) hours as needed., Disp: 30 tablet, Rfl: 0   ibuprofen (ADVIL) 800 MG tablet, Take 800 mg by mouth 3 (three) times daily as needed., Disp: , Rfl:    lovastatin (MEVACOR) 20 MG tablet, TAKE 1 TABLET BY MOUTH EVERYDAY AT BEDTIME, Disp: 90 tablet, Rfl: 1   metFORMIN (GLUCOPHAGE) 500 MG tablet, TAKE 1 TABLET BY MOUTH EVERY DAY WITH BREAKFAST, Disp: 90 tablet, Rfl: 1   Multiple Vitamins-Minerals (CELEBRATE MULTI-COMPLETE 18) CHEW, Chew by mouth daily., Disp: , Rfl:    nicotine polacrilex (NICORETTE) 4 MG gum, Take 1 each (4 mg total) by mouth as needed for smoking cessation., Disp: 100 tablet, Rfl: 0   oxyCODONE (OXY IR/ROXICODONE) 5 MG immediate release tablet, Take 1 tablet (5 mg total) by mouth every 4 (four) hours as needed for severe pain., Disp: 5  tablet, Rfl: 0   polyethylene glycol powder (GLYCOLAX/MIRALAX) 17 GM/SCOOP powder, Take 17 g by mouth daily. Drink 17g (1 scoop) dissolved in water per day., Disp: 255 g, Rfl: 0   sertraline (ZOLOFT) 100 MG tablet, Take 1 tablet (100 mg total) by mouth daily., Disp: 90 tablet, Rfl: 3   Objective There were no vitals filed for this visit.  Gen: No distress, AAO x3  Previous Pelvic Exam showed: Speculum exam reveals normal vaginal mucosa with atrophy. Cervix normal appearance. Uterus normal single, nontender. Adnexa normal adnexa.  Small subcentimeter divot noted half cm from the introitus at the midline. Rectal exam was performed and probe was inserted through the vagina- could not feel a connection from the divot to the rectum.     Assessment/ Plan  Assessment: The patient is a 65 y.o. year old with suspected rectovaginal fistula  Plan: Exam under anesthesia, rectovaginal fistula repair and perineoplasty.   [name], MD

## 2021-03-29 ENCOUNTER — Encounter (HOSPITAL_BASED_OUTPATIENT_CLINIC_OR_DEPARTMENT_OTHER): Payer: Self-pay | Admitting: Obstetrics and Gynecology

## 2021-03-29 ENCOUNTER — Other Ambulatory Visit: Payer: Self-pay

## 2021-03-29 NOTE — Progress Notes (Addendum)
Spoke w/ via phone for pre-op interview---pt Lab needs dos----   none           Lab results------lab appt 03-30-2021 1100 am for cbc bmp ekg COVID test -----patient states asymptomatic no test needed Arrive at -------530 am 04-01-2021 NPO after MN NO Solid Food.  Clear liquids from MN until---430 am Med rec completed Medications to take morning of surgery -----amlodipine Diabetic medication -----none day of surgery Patient instructed no nail polish to be worn day of surgery Patient instructed to bring photo id and insurance card day of surgery Patient aware to have Driver (ride ) / caregiver   Varney Biles walker friend will pick pt up  for 24 hours after surgery  Patient Special Instructions -----none Pre-Op special Istructions -----none Patient verbalized understanding of instructions that were given at this phone interview. Patient denies shortness of breath, chest pain, fever, cough or cardiac s & s at this phone interview.   Echo 04-12-2015 epic  Addendum: reviewed pt history and cardiology lov 09-02-2015 with dr e oddonou mda, pt ok for 04-01-2021 surgery at [facility] per dr e. Oddonu mda

## 2021-03-30 ENCOUNTER — Encounter (HOSPITAL_COMMUNITY)
Admission: RE | Admit: 2021-03-30 | Discharge: 2021-03-30 | Disposition: A | Discharge status: Home / Self Care | Payer: 59 | Source: Ambulatory Visit | Attending: Obstetrics and Gynecology | Admitting: Obstetrics and Gynecology

## 2021-03-30 ENCOUNTER — Encounter (HOSPITAL_COMMUNITY): Admission: RE | Admit: 2021-03-30 | Payer: 59 | Source: Ambulatory Visit

## 2021-03-30 DIAGNOSIS — Z7984 Long term (current) use of oral hypoglycemic drugs: Secondary | ICD-10-CM | POA: Diagnosis not present

## 2021-03-30 DIAGNOSIS — Z01812 Encounter for preprocedural laboratory examination: Secondary | ICD-10-CM | POA: Insufficient documentation

## 2021-03-30 DIAGNOSIS — T8131XA Disruption of external operation (surgical) wound, not elsewhere classified, initial encounter: Secondary | ICD-10-CM | POA: Diagnosis not present

## 2021-03-30 DIAGNOSIS — Z79899 Other long term (current) drug therapy: Secondary | ICD-10-CM | POA: Diagnosis not present

## 2021-03-30 LAB — BASIC METABOLIC PANEL
Anion gap: 8 (ref 5–15)
BUN: 11 mg/dL (ref 8–23)
CO2: 29 mmol/L (ref 22–32)
Calcium: 9.5 mg/dL (ref 8.9–10.3)
Chloride: 104 mmol/L (ref 98–111)
Creatinine, Ser: 0.72 mg/dL (ref 0.44–1.00)
GFR, Estimated: >60 mL/min (ref >60)
Glucose, Bld: 179 mg/dL — ABNORMAL HIGH (ref 70–99)
Potassium: 3.6 mmol/L (ref 3.5–5.1)
Sodium: 141 mmol/L (ref 135–145)

## 2021-03-30 LAB — CBC
HCT: 41.6 % (ref 36.0–46.0)
Hemoglobin: 13.5 g/dL (ref 12.0–15.0)
MCH: 29.2 pg (ref 26.0–34.0)
MCHC: 32.5 g/dL (ref 30.0–36.0)
MCV: 89.8 fL (ref 80.0–100.0)
Platelets: 352 10*3/uL (ref 150–400)
RBC: 4.63 MIL/uL (ref 3.87–5.11)
RDW: 14 % (ref 11.5–15.5)
WBC: 9 10*3/uL (ref 4.0–10.5)
nRBC: 0 % (ref 0.0–0.2)

## 2021-03-31 NOTE — Anesthesia Preprocedure Evaluation (Addendum)
Anesthesia Evaluation  Patient identified by MRN, date of birth, ID band Patient awake    Reviewed: Allergy & Precautions, NPO status , Patient's Chart, lab work & pertinent test results  History of Anesthesia Complications Negative for: history of anesthetic complications  Airway Mallampati: II  TM Distance: >3 FB Neck ROM: Full    Dental  (+) Partial Lower, Partial Upper,    Pulmonary former smoker,    Pulmonary exam normal        Cardiovascular hypertension, Pt. on medications Normal cardiovascular exam     Neuro/Psych  Headaches, Anxiety    GI/Hepatic negative GI ROS, Neg liver ROS,   Endo/Other  diabetes, Type 2, Oral Hypoglycemic Agents  Renal/GU negative Renal ROS  negative genitourinary   Musculoskeletal  (+) Arthritis ,   Abdominal   Peds  Hematology negative hematology ROS (+)   Anesthesia Other Findings rectovaginal fistula  Reproductive/Obstetrics negative OB ROS                            Anesthesia Physical Anesthesia Plan  ASA: 2  Anesthesia Plan: General   Post-op Pain Management:    Induction: Intravenous  PONV Risk Score and Plan: 3 and Treatment may vary due to age or medical condition, Ondansetron, Dexamethasone and Midazolam  Airway Management Planned: LMA  Additional Equipment: None  Intra-op Plan:   Post-operative Plan: Extubation in OR  Informed Consent: I have reviewed the patients History and Physical, chart, labs and discussed the procedure including the risks, benefits and alternatives for the proposed anesthesia with the patient or authorized representative who has indicated his/her understanding and acceptance.     Dental advisory given  Plan Discussed with: CRNA  Anesthesia Plan Comments:        Anesthesia Quick Evaluation

## 2021-04-01 ENCOUNTER — Ambulatory Visit (HOSPITAL_BASED_OUTPATIENT_CLINIC_OR_DEPARTMENT_OTHER): Payer: 59 | Admitting: Anesthesiology

## 2021-04-01 ENCOUNTER — Ambulatory Visit (HOSPITAL_BASED_OUTPATIENT_CLINIC_OR_DEPARTMENT_OTHER)
Admission: RE | Admit: 2021-04-01 | Discharge: 2021-04-01 | Disposition: A | Discharge status: Home / Self Care | Payer: 59 | Source: Ambulatory Visit | Attending: Obstetrics and Gynecology | Admitting: Obstetrics and Gynecology

## 2021-04-01 ENCOUNTER — Encounter (HOSPITAL_BASED_OUTPATIENT_CLINIC_OR_DEPARTMENT_OTHER)
Admission: RE | Disposition: A | Discharge status: Home / Self Care | Payer: Self-pay | Source: Ambulatory Visit | Attending: Obstetrics and Gynecology

## 2021-04-01 ENCOUNTER — Encounter (HOSPITAL_BASED_OUTPATIENT_CLINIC_OR_DEPARTMENT_OTHER): Payer: Self-pay | Admitting: Obstetrics and Gynecology

## 2021-04-01 DIAGNOSIS — Z7984 Long term (current) use of oral hypoglycemic drugs: Secondary | ICD-10-CM | POA: Insufficient documentation

## 2021-04-01 DIAGNOSIS — T8131XA Disruption of external operation (surgical) wound, not elsewhere classified, initial encounter: Secondary | ICD-10-CM | POA: Insufficient documentation

## 2021-04-01 DIAGNOSIS — Y838 Other surgical procedures as the cause of abnormal reaction of the patient, or of later complication, without mention of misadventure at the time of the procedure: Secondary | ICD-10-CM | POA: Insufficient documentation

## 2021-04-01 DIAGNOSIS — N823 Fistula of vagina to large intestine: Secondary | ICD-10-CM | POA: Diagnosis not present

## 2021-04-01 DIAGNOSIS — Z79899 Other long term (current) drug therapy: Secondary | ICD-10-CM | POA: Insufficient documentation

## 2021-04-01 HISTORY — DX: Presence of spectacles and contact lenses: Z97.3

## 2021-04-01 HISTORY — DX: Presence of dental prosthetic device (complete) (partial): Z97.2

## 2021-04-01 HISTORY — PX: PERINEOPLASTY: SHX2218

## 2021-04-01 HISTORY — DX: Prediabetes: R73.03

## 2021-04-01 LAB — GLUCOSE, CAPILLARY
Glucose-Capillary: 155 mg/dL — ABNORMAL HIGH (ref 70–99)
Glucose-Capillary: 162 mg/dL — ABNORMAL HIGH (ref 70–99)

## 2021-04-01 SURGERY — EXAM UNDER ANESTHESIA
Anesthesia: General | Site: Vagina

## 2021-04-01 MED ORDER — ONDANSETRON HCL 4 MG/2ML IJ SOLN
INTRAMUSCULAR | Status: DC | PRN
  Administered 2021-04-01: 4 mg via INTRAVENOUS

## 2021-04-01 MED ORDER — LACTATED RINGERS IV SOLN: INTRAVENOUS | Status: DC

## 2021-04-01 MED ORDER — FENTANYL CITRATE (PF) 100 MCG/2ML IJ SOLN: 25.0000 ug | INTRAMUSCULAR | Status: DC | PRN

## 2021-04-01 MED ORDER — LIDOCAINE 2% (20 MG/ML) 5 ML SYRINGE
INTRAMUSCULAR | Status: AC
  Filled 2021-04-01: qty 5

## 2021-04-01 MED ORDER — GENTAMICIN SULFATE 40 MG/ML IJ SOLN: 5.0000 mg/kg | INTRAVENOUS | Status: DC

## 2021-04-01 MED ORDER — DEXAMETHASONE SODIUM PHOSPHATE 4 MG/ML IJ SOLN
INTRAMUSCULAR | Status: DC | PRN
  Administered 2021-04-01: 5 mg via INTRAVENOUS

## 2021-04-01 MED ORDER — ACETAMINOPHEN 500 MG PO TABS
1000.0000 mg | ORAL_TABLET | Freq: Once | ORAL | Status: AC
  Administered 2021-04-01: 1000 mg via ORAL

## 2021-04-01 MED ORDER — METRONIDAZOLE 500 MG/100ML IV SOLN
500.0000 mg | INTRAVENOUS | Status: AC
  Administered 2021-04-01: 500 mg via INTRAVENOUS

## 2021-04-01 MED ORDER — 0.9 % SODIUM CHLORIDE (POUR BTL) OPTIME
TOPICAL | Status: DC | PRN
  Administered 2021-04-01: 500 mL

## 2021-04-01 MED ORDER — ACETAMINOPHEN 500 MG PO TABS
ORAL_TABLET | ORAL | Status: AC
  Filled 2021-04-01: qty 2

## 2021-04-01 MED ORDER — EPHEDRINE SULFATE 50 MG/ML IJ SOLN
INTRAMUSCULAR | Status: DC | PRN
  Administered 2021-04-01: 10 mg via INTRAVENOUS
  Administered 2021-04-01: 15 mg via INTRAVENOUS

## 2021-04-01 MED ORDER — LIDOCAINE HCL (CARDIAC) PF 100 MG/5ML IV SOSY
PREFILLED_SYRINGE | INTRAVENOUS | Status: DC | PRN
  Administered 2021-04-01: 100 mg via INTRAVENOUS

## 2021-04-01 MED ORDER — PROPOFOL 10 MG/ML IV BOLUS
INTRAVENOUS | Status: DC | PRN
  Administered 2021-04-01: 200 mg via INTRAVENOUS

## 2021-04-01 MED ORDER — FENTANYL CITRATE (PF) 100 MCG/2ML IJ SOLN
INTRAMUSCULAR | Status: DC | PRN
  Administered 2021-04-01: 25 ug via INTRAVENOUS
  Administered 2021-04-01: 50 ug via INTRAVENOUS
  Administered 2021-04-01: 25 ug via INTRAVENOUS

## 2021-04-01 MED ORDER — PROPOFOL 10 MG/ML IV BOLUS
INTRAVENOUS | Status: AC
  Filled 2021-04-01: qty 20

## 2021-04-01 MED ORDER — OXYCODONE HCL 5 MG PO TABS: 5.0000 mg | ORAL_TABLET | Freq: Once | ORAL | Status: DC | PRN

## 2021-04-01 MED ORDER — DEXAMETHASONE SODIUM PHOSPHATE 10 MG/ML IJ SOLN
INTRAMUSCULAR | Status: AC
  Filled 2021-04-01: qty 1

## 2021-04-01 MED ORDER — ONDANSETRON HCL 4 MG/2ML IJ SOLN
INTRAMUSCULAR | Status: AC
  Filled 2021-04-01: qty 2

## 2021-04-01 MED ORDER — METRONIDAZOLE 500 MG/100ML IV SOLN
INTRAVENOUS | Status: AC
  Filled 2021-04-01: qty 100

## 2021-04-01 MED ORDER — KETOROLAC TROMETHAMINE 30 MG/ML IJ SOLN
INTRAMUSCULAR | Status: AC
  Filled 2021-04-01: qty 1

## 2021-04-01 MED ORDER — GENTAMICIN SULFATE 40 MG/ML IJ SOLN
5.0000 mg/kg | INTRAVENOUS | Status: AC
  Administered 2021-04-01: 330 mg via INTRAVENOUS
  Filled 2021-04-01: qty 8.25

## 2021-04-01 MED ORDER — KETOROLAC TROMETHAMINE 30 MG/ML IJ SOLN
INTRAMUSCULAR | Status: DC | PRN
  Administered 2021-04-01: 30 mg via INTRAVENOUS

## 2021-04-01 MED ORDER — MIDAZOLAM HCL 5 MG/5ML IJ SOLN
INTRAMUSCULAR | Status: DC | PRN
  Administered 2021-04-01: 2 mg via INTRAVENOUS

## 2021-04-01 MED ORDER — OXYCODONE HCL 5 MG/5ML PO SOLN: 5.0000 mg | Freq: Once | ORAL | Status: DC | PRN

## 2021-04-01 MED ORDER — MIDAZOLAM HCL 2 MG/2ML IJ SOLN
INTRAMUSCULAR | Status: AC
  Filled 2021-04-01: qty 2

## 2021-04-01 MED ORDER — PROMETHAZINE HCL 25 MG/ML IJ SOLN: 6.2500 mg | INTRAMUSCULAR | Status: DC | PRN

## 2021-04-01 MED ORDER — LIDOCAINE-EPINEPHRINE 1 %-1:100000 IJ SOLN
INTRAMUSCULAR | Status: DC | PRN
  Administered 2021-04-01: 5 mL

## 2021-04-01 MED ORDER — FENTANYL CITRATE (PF) 100 MCG/2ML IJ SOLN
INTRAMUSCULAR | Status: AC
  Filled 2021-04-01: qty 2

## 2021-04-01 MED ORDER — PHENYLEPHRINE HCL (PRESSORS) 10 MG/ML IV SOLN
INTRAVENOUS | Status: DC | PRN
  Administered 2021-04-01 (×2): 80 ug via INTRAVENOUS

## 2021-04-01 SURGICAL SUPPLY — 27 items
BLADE SURG 15 STRL LF DISP TIS (BLADE) ×1 IMPLANT
BLADE SURG 15 STRL SS (BLADE) ×2
CATH ROBINSON RED A/P 16FR (CATHETERS) ×2 IMPLANT
DECANTER SPIKE VIAL GLASS SM (MISCELLANEOUS) IMPLANT
GAUZE 4X4 16PLY ~~LOC~~+RFID DBL (SPONGE) ×2 IMPLANT
GLOVE SURG ENC MOIS LTX SZ6 (GLOVE) ×2 IMPLANT
GLOVE SURG UNDER POLY LF SZ6.5 (GLOVE) ×2 IMPLANT
GOWN STRL REUS W/TWL LRG LVL3 (GOWN DISPOSABLE) ×2 IMPLANT
HIBICLENS CHG 4% 4OZ (MISCELLANEOUS) ×2 IMPLANT
HOOK RETRACTION 12 ELAST STAY (MISCELLANEOUS) ×2 IMPLANT
KIT TURNOVER CYSTO (KITS) ×2 IMPLANT
NEEDLE HYPO 22GX1.5 SAFETY (NEEDLE) ×4 IMPLANT
NS IRRIG 1000ML POUR BTL (IV SOLUTION) ×2 IMPLANT
PACK VAGINAL WOMENS (CUSTOM PROCEDURE TRAY) ×2 IMPLANT
RETRACTOR LONE STAR DISPOSABLE (INSTRUMENTS) ×2 IMPLANT
SET IRRIG Y TYPE TUR BLADDER L (SET/KITS/TRAYS/PACK) IMPLANT
SUCTION FRAZIER HANDLE 10FR (MISCELLANEOUS) ×2
SUCTION TUBE FRAZIER 10FR DISP (MISCELLANEOUS) ×1 IMPLANT
SUT MON AB 2-0 SH 27 (SUTURE) IMPLANT
SUT VIC AB 0 CT1 27 (SUTURE) ×2
SUT VIC AB 0 CT1 27XBRD ANTBC (SUTURE) ×1 IMPLANT
SUT VIC AB 2-0 SH 27 (SUTURE)
SUT VIC AB 2-0 SH 27XBRD (SUTURE) IMPLANT
SUT VIC AB 3-0 SH 18 (SUTURE) IMPLANT
SUT VICRYL 2-0 SH 8X27 (SUTURE) IMPLANT
SYR BULB EAR ULCER 3OZ GRN STR (SYRINGE) ×2 IMPLANT
TOWEL OR 17X26 10 PK STRL BLUE (TOWEL DISPOSABLE) ×2 IMPLANT

## 2021-04-01 NOTE — Anesthesia Postprocedure Evaluation (Signed)
Anesthesia Post Note  Patient: CENIYA FOWERS  Procedure(s) Performed: [name] UNDER ANESTHESIA (Vagina ) PERINEOPLASTY and rectovaginal fistual repair (Vagina )     Patient location during evaluation: PACU Anesthesia Type: General Level of consciousness: awake and alert and oriented Pain management: pain level controlled Vital Signs Assessment: post-procedure vital signs reviewed and stable Respiratory status: spontaneous breathing, nonlabored ventilation and respiratory function stable Cardiovascular status: blood pressure returned to baseline Postop Assessment: no apparent nausea or vomiting Anesthetic complications: no   No notable events documented.  Last Vitals:  Vitals:   04/01/21 0830 04/01/21 0845  BP: 140/86 121/88  Pulse: 90 90  Resp: 17 20  Temp: 36.4 C 36.5 C  SpO2: 100% 100%    Last Pain:  Vitals:   04/01/21 0845  TempSrc:   PainSc: 0-No pain                 Marthenia Rolling

## 2021-04-01 NOTE — Anesthesia Procedure Notes (Signed)
Procedure Name: LMA Insertion Date/Time: 04/01/2021 7:34 AM Performed by: Bufford Spikes, CRNA Pre-anesthesia Checklist: Patient identified, Emergency Drugs available, Suction available and Patient being monitored Patient Re-evaluated:Patient Re-evaluated prior to induction Oxygen Delivery Method: Circle system utilized Preoxygenation: Pre-oxygenation with 100% oxygen Induction Type: IV induction Ventilation: Mask ventilation without difficulty LMA: LMA inserted LMA Size: 4.0 Number of attempts: 1 Placement Confirmation: positive ETCO2 Tube secured with: Tape Dental Injury: Teeth and Oropharynx as per pre-operative assessment

## 2021-04-01 NOTE — Interval H&P Note (Signed)
History and Physical Interval Note:  04/01/2021 7:16 AM  Misty Bell  has presented today for surgery, with the diagnosis of rectovaginal fistula.  The various methods of treatment have been discussed with the patient and family. After consideration of risks, benefits and other options for treatment, the patient has consented to  Procedure(s): EXAM UNDER ANESTHESIA (N/A) PERINEOPLASTY and rectovaginal fistual repair (unable to pull procedure when scheduling the surgery) (N/A)  Vitals:   04/01/21 0615  BP: 130/87  Pulse: 95  Resp: 17  Temp: 98.4 F (36.9 C)  SpO2: 97%    Gen: NAD  Lungs: normal respirations Abd: soft, nontender   The patient's history has been reviewed, patient examined, no change in status, stable for surgery.  I have reviewed the patient's chart and labs.  Questions were answered to the patient's satisfaction.     [name]

## 2021-04-01 NOTE — Transfer of Care (Signed)
Immediate Anesthesia Transfer of Care Note  Patient: Misty Bell  Procedure(s) Performed: [name] UNDER ANESTHESIA (Vagina ) PERINEOPLASTY and rectovaginal fistual repair (Vagina )  Patient Location: PACU  Anesthesia Type:General  Level of Consciousness: awake, alert  and oriented  Airway & Oxygen Therapy: Patient Spontanous Breathing and Patient connected to nasal cannula oxygen  Post-op Assessment: Report given to RN and Post -op Vital signs reviewed and stable  Post vital signs: Reviewed and stable  Last Vitals:  Vitals Value Taken Time  BP 140/86 04/01/21 0830  Temp    Pulse    Resp 18 04/01/21 0830  SpO2    Vitals shown include unvalidated device data.  Last Pain:  Vitals:   04/01/21 0615  TempSrc: Oral  PainSc: 0-No pain      Patients Stated Pain Goal: 5 (60/63/01 6010)  Complications: No notable events documented.

## 2021-04-01 NOTE — Discharge Instructions (Addendum)
POST OPERATIVE INSTRUCTIONS  General Instructions Recovery (not bed rest) will last approximately 6 weeks Walking is encouraged, but refrain from strenuous exercise/ housework/ heavy lifting. No lifting >10lbs  Nothing in the vagina- NO intercourse, tampons or douching Bathing:  Do not submerge in water (NO swimming, bath, hot tub, etc) until after your postop visit. You can shower starting the day after surgery.  No driving until you are not taking narcotic pain medicine and until your pain is well enough controlled that you can slam on the breaks or make sudden movements if needed.   Taking your medications Please take your acetaminophen and ibuprofen on a schedule for the first 48 hours. Take 600mg  ibuprofen, then take 500mg  acetaminophen 3 hours later, then continue to alternate ibuprofen and acetaminophen. That way you are taking each type of medication every 6 hours. Take the prescribed narcotic (oxycodone, tramadol, etc) as needed, with a maximum being every 4 hours.  Take a stool softener daily to keep your stools soft and preventing you from straining. If you have diarrhea, you decrease your stool softener. This is explained more below. We have prescribed you Miralax.  Reasons to Call the Nurse (see last page for phone numbers) Heavy Bleeding (changing your pad every 1-2 hours) Persistent nausea/vomiting Fever (100.4 degrees or more) Incision problems (pus or other fluid coming out, redness, warmth, increased pain)  Things to Expect After Surgery Mild to Moderate pain is normal during the first day or two after surgery. If prescribed, take Ibuprofen or Tylenol first and use the stronger medicine for "break-through" pain. You can overlap these medicines because they work differently.   Constipation   To Prevent Constipation:  Eat a well-balanced diet including protein, grains, fresh fruit and vegetables.  Drink plenty of fluids. Walk regularly.  Depending on specific instructions  from your physician: take Miralax daily and additionally you can add a stool softener (colace/ docusate) and fiber supplement. Continue as long as you're on pain medications.   To Treat Constipation:  If you do not have a bowel movement in 2 days after surgery, you can take 2 Tbs of Milk of Magnesia 1-2 times a day until you have a bowel movement. If diarrhea occurs, decrease the amount or stop the laxative. If no results with Milk of Magnesia, you can drink a bottle of magnesium citrate which you can purchase over the counter.  Fatigue:  This is a normal response to surgery and will improve with time.  Plan frequent rest periods throughout the day.  Gas Pain:  This is very common but can also be very painful! Drink warm liquids such as herbal teas, bouillon or soup. Walking will help you pass more gas.  Mylicon or Gas-X can be taken over the counter   Return to Work  As work demands and recovery times vary widely, it is hard to predict when you will want to return to work. If you have a desk job with no strenuous physical activity, and if you would like to return sooner than generally recommended, discuss this with your provider or call our office.   Post op concerns  For non-emergent issues, please call the Urogynecology Nurse. Please leave a message and someone will contact you within one business day.  You can also send a message through [place].   AFTER HOURS (After 5:00 PM and on weekends):  For urgent matters that cannot wait until the next business day. Call our office [phone] and connect to the doctor on call.  Please reserve this for important issues.   **FOR ANY TRUE EMERGENCY ISSUES CALL 911 OR GO TO THE NEAREST EMERGENCY ROOM.** Please inform our office or the doctor on call of any emergency.     APPOINTMENTS: Call [phone]    Post Anesthesia Home Care Instructions  Activity: Get plenty of rest for the remainder of the day. A responsible individual must stay with  you for 24 hours following the procedure.  For the next 24 hours, DO NOT: -Drive a car -Paediatric nurse -Drink alcoholic beverages -Take any medication unless instructed by your physician -Make any legal decisions or sign important papers.  Meals: Start with liquid foods such as gelatin or soup. Progress to regular foods as tolerated. Avoid greasy, spicy, heavy foods. If nausea and/or vomiting occur, drink only clear liquids until the nausea and/or vomiting subsides. Call your physician if vomiting continues.  Special Instructions/Symptoms: Your throat may feel dry or sore from the anesthesia or the breathing tube placed in your throat during surgery. If this causes discomfort, gargle with warm salt water. The discomfort should disappear within 24 hours.  **No acetaminophen/Tylenol until 12pm today if needed.  **No ibuprofen, Advil, Aleve, Motrin, ketorolac, meloxicam, or naproxen until after 2pm today if needed.

## 2021-04-01 NOTE — Op Note (Signed)
Operative Note  Preoperative Diagnosis: rectovaginal fistula, disruption of perineal obstetric wound  Postoperative Diagnosis: disruption of perineal obstetric wound  Procedures performed:  Exam under anesthesia, perineoplasty   Attending Surgeon: Sherlene Shams, MD   Anesthesia: General LMA  Findings: 1. Pinpoint depression in vaginal wall. On rectovaginal exam with probing, this did not connect to the rectum  2. Perineal body short and disrupted.    Specimens: none  Estimated blood loss: 50 mL  IV fluids: 400 mL  Urine output: 5 mL  Complications: none   Indications: [name] s/p vaginal delivery 30 years ago with episiotomy repair. Developed symptoms of rectovaginal fistula of stool and gas per vagina. On exam in the office, perineal body was disrupted but only a pinpoint area was noted in the vagina which did not connect to the rectum with probing. Therefore she was taken to the operating room for possible rectovaginal fistula repair and perineoplasty.   Procedure in Detail:  After informed consent was obtained, the patient was taken to the operating room where general anesthesia was induced. She was placed in dorsal lithotomy position, taking care to avoid any traction of the extremities and prepped and draped in the usual sterile fashion. A self-retaining lonestar retractor was placed using four elastic blue stays. Attention was then turned to the posterior vagina.  Two Allis clamps were placed at the junction of the perineal body and posterior vagina. A pinpoint divot was noted in the posterior vagina approximately 1cm from the introitus. A rectal exam was performed and a lacrimal duct probe was placed into the depression. There was no connection between this area and the rectum. 1% lidocaine with epinephrine was injected into the posterior vaginal wall and perineal body.  A 15 blade scalpel was used to make an elliptical shaped angle incision between the perineal body and  the posterior vaginal wall.  The epithelium was undermined with Metzenbaum scissors.  The divot was removed. Some fatty tissue was noted.  A rectal exam was again performed and the posterior vaginal wall was probed and no fistula was noted.  The internal anal sphincter was plicated with 3-0 Vicryl suture in interrupted fashion.  The  posterior vaginal wall epithelium was closed with a 2-0 Vicryl in a running fashion.  The perineal body was reapproximated with 0 Vicryl in an interrupted fashion in 2 layers.  The perineal skin was then closed with 3-0 Vicryl in a subcuticular fashion.  The area was irrigated and hemostasis was noted.  Patient tolerated the procedure well and was taken the recovery room in stable condition.  Needle and sponge counts were correct x2.  [name], MD

## 2021-04-02 ENCOUNTER — Telehealth: Payer: Self-pay | Admitting: Obstetrics and Gynecology

## 2021-04-02 NOTE — Telephone Encounter (Signed)
Misty Bell underwent perineoplasty on 04/01/21.   She did not have a voiding trial. Please call her for a routine post op check. Thanks!  [name], MD

## 2021-04-05 ENCOUNTER — Encounter (HOSPITAL_BASED_OUTPATIENT_CLINIC_OR_DEPARTMENT_OTHER): Payer: Self-pay | Admitting: Obstetrics and Gynecology

## 2021-04-06 NOTE — Telephone Encounter (Signed)
Attempted to call the patient re: post op call. Pt has not called back. No VM for me to leave a message.

## 2021-04-07 ENCOUNTER — Encounter (HOSPITAL_BASED_OUTPATIENT_CLINIC_OR_DEPARTMENT_OTHER): Payer: Self-pay | Admitting: *Deleted

## 2021-04-07 ENCOUNTER — Telehealth: Payer: Self-pay | Admitting: Obstetrics and Gynecology

## 2021-04-07 ENCOUNTER — Encounter: Payer: Self-pay | Admitting: Obstetrics and Gynecology

## 2021-04-07 NOTE — Telephone Encounter (Signed)
Advised pt to monitor BP if she experiences any additional headaches. Advised that she could use OTC Excedrin or Tylenol for headaches, as well as staying hydrated. Advised that if additional oxycodone was needed for pain then we would want her to come into office first for evaluation. Pt states it is not needed. Advised that work note with lifting restriction of 10 lbs was sent through [employer]. Pt verbalized understanding.

## 2021-04-07 NOTE — Telephone Encounter (Signed)
Agree with checking blood pressure. Advise to take OTC excedrin or tylenol for headaches. Do not advise more oxycodone without an appointment as she should not be requiring this further. Ensure she is staying hydrated as well.   Return to work letter made and sent to my chart. Advised restriction of no lifting >10lbs until further cleared.   [name], MD

## 2021-04-07 NOTE — Telephone Encounter (Signed)
Pt states that she had been experiencing a throbbing headache and nausea the last 2 days. She states that she is feeling much better today. Pt denied any vision changes with the headaches. Pt did not check her BP during that time, states that she did not think to do that. Her pain is managed and has been taking oxycodone occasionally. She has been rotating tylenol and ibuprofen. Denies any other issues. Also requests a return to work note. She states that she feels she should be ok to go back to work on Monday.Advised that post-surgery healing time is typically longer than 1 week.  Advised that I would send her concerns to Dr. Wannetta Sender and get back with her recommendations. Pt verbalized understanding.

## 2021-04-08 ENCOUNTER — Encounter: Payer: Self-pay | Admitting: Family Medicine

## 2021-04-08 DIAGNOSIS — U071 COVID-19: Secondary | ICD-10-CM

## 2021-04-08 MED ORDER — MOLNUPIRAVIR EUA 200MG CAPSULE: 4.0000 | ORAL_CAPSULE | Freq: Two times a day (BID) | ORAL | 0 refills | Status: AC

## 2021-04-08 NOTE — Addendum Note (Signed)
Addended by: Lamar Blinks C on: 04/08/2021 01:13 PM   Modules accepted: Orders

## 2021-04-27 NOTE — Telephone Encounter (Signed)
I have printed this off along with the last letter and initial conversation- in folder to be reviewed/ altered for form completion.
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
## 2021-05-05 ENCOUNTER — Ambulatory Visit (INDEPENDENT_AMBULATORY_CARE_PROVIDER_SITE_OTHER): Payer: 59 | Admitting: Family Medicine

## 2021-05-05 ENCOUNTER — Other Ambulatory Visit: Payer: Self-pay

## 2021-05-05 VITALS — BP 120/70 | HR 82 | Temp 98.1°F | Resp 18 | Ht 62.0 in | Wt 195.2 lb

## 2021-05-05 DIAGNOSIS — R531 Weakness: Secondary | ICD-10-CM

## 2021-05-05 DIAGNOSIS — U099 Post covid-19 condition, unspecified: Secondary | ICD-10-CM | POA: Diagnosis not present

## 2021-05-05 DIAGNOSIS — Z23 Encounter for immunization: Secondary | ICD-10-CM | POA: Diagnosis not present

## 2021-05-05 NOTE — Patient Instructions (Addendum)
I will draw up a letter and sent to the office of accessibility resources for you asap You got your flu and 2nd dose of MMR today You are IMMUNE to varicella so you don't need this vaccine  I will put in for a neurology referral for you in case your weakness persists

## 2021-05-14 ENCOUNTER — Ambulatory Visit (INDEPENDENT_AMBULATORY_CARE_PROVIDER_SITE_OTHER): Payer: 59 | Admitting: Obstetrics and Gynecology

## 2021-05-14 ENCOUNTER — Encounter: Payer: Self-pay | Admitting: Obstetrics and Gynecology

## 2021-05-14 ENCOUNTER — Other Ambulatory Visit: Payer: Self-pay

## 2021-05-14 VITALS — BP 133/88 | Temp 101.0°F | Ht 62.0 in | Wt 195.0 lb

## 2021-05-14 DIAGNOSIS — Z9889 Other specified postprocedural states: Secondary | ICD-10-CM

## 2021-05-14 NOTE — Progress Notes (Signed)
[facility]  Date of Visit: 05/14/2021  History of Present Illness: Misty Bell is a 65 y.o. female scheduled today for a post-operative visit.   Surgery: s/p perineoplasty on 04/01/21. Concern for possible rectovaginal fistula but not present.   Postoperative course has been uncomplicated.   Today she reports aching and pain has stopped. Still occasionally sees some spotting. Has not noted any stool per vagina.    Medications: She has a current medication list which includes the following prescription(s): amlodipine, calcium citrate-vitamin d, furosemide, lovastatin, metformin, celebrate multi-complete 18, nicotine polacrilex, and acetaminophen.   Allergies: Patient has No Known Allergies.   Physical Exam: BP 133/88   Temp (!) 101 F (38.3 C)   Ht 5\' 2"  (1.575 m)   Wt 195 lb (88.5 kg)   BMI 35.67 kg/m    Pelvic Examination: Perineum intact with no scar present. Posterior vaginal wall has slight remnant of suture but otherwise well healed.   POP-Q: deferred ---------------------------------------------------------  Assessment and Plan:  1. Post-operative state     - Has access to operative report through my chart.  - Can resume regular activity. Advised to wait an additional week for intercourse to allow the suture remnant to heal.   Follow up as needed.    [name], MD

## 2021-05-14 NOTE — Patient Instructions (Signed)
Wait one week for intercourse, otherwise can return to regular activity.

## 2021-06-11 ENCOUNTER — Other Ambulatory Visit: Payer: Self-pay | Admitting: Family Medicine

## 2021-06-11 DIAGNOSIS — F4321 Adjustment disorder with depressed mood: Secondary | ICD-10-CM

## 2021-08-26 ENCOUNTER — Encounter: Payer: Self-pay | Admitting: Obstetrics & Gynecology

## 2021-08-26 ENCOUNTER — Encounter: Payer: Self-pay | Admitting: Family Medicine

## 2021-08-31 ENCOUNTER — Ambulatory Visit: Payer: Self-pay

## 2021-08-31 NOTE — Progress Notes (Deleted)
Misty Bell is a 66 y.o. female presents to the office today for a flu vaccine. ? ?Vaccine given in deltoid. Patient tolerated injection well. ? ? ?Raynelle Bring [place] , CMA  ?

## 2021-09-25 ENCOUNTER — Other Ambulatory Visit: Payer: Self-pay | Admitting: Obstetrics and Gynecology

## 2021-09-25 ENCOUNTER — Other Ambulatory Visit: Payer: Self-pay | Admitting: Family Medicine

## 2021-09-25 DIAGNOSIS — Z01818 Encounter for other preprocedural examination: Secondary | ICD-10-CM

## 2021-09-25 DIAGNOSIS — F4321 Adjustment disorder with depressed mood: Secondary | ICD-10-CM

## 2021-09-27 NOTE — Telephone Encounter (Signed)
Dr Lorelei Pont, ? ?Sertraline was discontinued from pt's med list on 03/29/21 but I am not sure why it was discontinued.  I spoke with pt and she states she still takes it and would like to continue the prescription.  Please advise if ok to send refill? ?

## 2021-10-11 ENCOUNTER — Encounter: Payer: Self-pay | Admitting: Obstetrics & Gynecology

## 2021-10-14 ENCOUNTER — Encounter: Payer: Self-pay | Admitting: Family

## 2021-10-14 ENCOUNTER — Ambulatory Visit (INDEPENDENT_AMBULATORY_CARE_PROVIDER_SITE_OTHER): Payer: 59 | Admitting: Family

## 2021-10-14 ENCOUNTER — Telehealth: Payer: Self-pay | Admitting: Family Medicine

## 2021-10-14 VITALS — BP 120/70 | HR 103 | Temp 97.9°F | Ht 62.0 in | Wt 186.6 lb

## 2021-10-14 DIAGNOSIS — R109 Unspecified abdominal pain: Secondary | ICD-10-CM

## 2021-10-14 MED ORDER — METHOCARBAMOL 500 MG PO TABS: 500.0000 mg | ORAL_TABLET | Freq: Every evening | ORAL | 0 refills | Status: DC | PRN

## 2021-10-14 MED ORDER — MELOXICAM 15 MG PO TABS: 15.0000 mg | ORAL_TABLET | Freq: Every day | ORAL | 0 refills | Status: DC

## 2021-10-14 MED ORDER — METHOCARBAMOL 500 MG PO TABS: 500.0000 mg | ORAL_TABLET | Freq: Three times a day (TID) | ORAL | 0 refills | Status: DC | PRN
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
## 2021-10-14 NOTE — Telephone Encounter (Signed)
Nurse Assessment ?Nurse: Vallery Sa, RN, Cathy Date/Time (Eastern Time): 10/14/2021 12:11:35 PM ?Confirm and document reason for call. If ?symptomatic, describe symptoms. ?---Misty Bell states she developed right flank pain on and ?off the past 5 days (current pain rated as a 5 on the 1 to ?10 scale). No fever. No injury in the past week. Alert ?and responsive. ?Does the patient have any new or worsening ?symptoms? ---Yes ?Will a triage be completed? ---Yes ?Related visit to physician within the last 2 weeks? ---No ?Does the PT have any chronic conditions? (i.e. ?diabetes, asthma, this includes High risk factors for ?pregnancy, etc.) ?---Yes ?List chronic conditions. ---High Blood Pressure, Borderline Diabetes ?Is this a behavioral health or substance abuse call? ---No ?Guidelines ?Guideline Title Affirmed Question Affirmed Notes Nurse Date/Time (Eastern ?Time) ?Flank Pain MODERATE pain ?(e.g., interferes with ?normal activities or ?awakens from sleep) ?Vallery Sa, RN, [place] 10/14/2021 12:14:55 ?PM ?PLEASE NOTE: All timestamps contained within this report are represented as [place] Standard Time. ?CONFIDENTIALTY NOTICE: This fax transmission is intended only for the addressee. It contains information that is legally privileged, confidential or ?otherwise protected from use or disclosure. If you are not the intended recipient, you are strictly prohibited from reviewing, disclosing, copying using ?or disseminating any of this information or taking any action in reliance on or regarding this information. If you have received this fax in error, please ?notify us immediately by telephone so that we can arrange for its return to [place]. Phone: [phone], Toll-Free: [phone], Fax: [phone] ?Page: 2 of 2 ?Call Id: [id number] ?Disp. Time (Eastern ?Time) Disposition Final User ?10/14/2021 12:10:20 PM Send to Urgent Queue Puentes, Dewaine Oats ?10/14/2021 12:17:01 PM See PCP within 24 Hours Yes [place], RN, [place] ?Caller Disagree/Comply  Comply ?Caller Understands Yes ?PreDisposition Call Doctor ?Care Advice Given Per Guideline ?SEE PCP WITHIN 24 HOURS: * IF OFFICE WILL BE OPEN: You need to be examined within the next 24 hours. Call your ?doctor (or NP/PA) when the office opens and make an appointment. PAIN MEDICINES: * For pain relief, you can take either ?acetaminophen, ibuprofen, or naproxen. * ACETAMINOPHEN - REGULAR STRENGTH TYLENOL: Take 650 mg (two 325 mg ?pills) by mouth every 4 to 6 hours as needed. Each Regular Strength Tylenol pill has 325 mg of acetaminophen. The most you should ?take is 10 pills a day (3,250 mg total). Note: In [place], the maximum is 12 pills a day (3,900 mg total). CALL BACK IF: * Fever ?over 100.4 F (38.0 C) * You become worse CARE ADVICE given per Flank Pain (Adult) guideline. ?Comments ?User: Berton Mount, RN Date/Time Eilene Ghazi Time): 10/14/2021 12:18:13 PM ?Transferred to the office to check on appointment options. ?Referrals ?REFERRED TO PCP OFFICE ?

## 2021-10-14 NOTE — Telephone Encounter (Signed)
Appt scheduled with Mickel Baas today.  ?

## 2021-10-14 NOTE — Telephone Encounter (Signed)
Pt states she was in excruciating pain Tuesday on her right side and hurt to breathe. She stated it has gotten better but wants to be seen. Transferred to triage to speak with a nurse.  ?

## 2021-10-15 LAB — CBC WITH DIFFERENTIAL/PLATELET
Basophils Absolute: 0.1 10*3/uL (ref 0.0–0.1)
Basophils Relative: 1.1 % (ref 0.0–3.0)
Eosinophils Absolute: 0.2 10*3/uL (ref 0.0–0.7)
Eosinophils Relative: 1.9 % (ref 0.0–5.0)
HCT: 41.1 % (ref 36.0–46.0)
Hemoglobin: 13.8 g/dL (ref 12.0–15.0)
Lymphocytes Relative: 35 % (ref 12.0–46.0)
Lymphs Abs: 3.6 10*3/uL (ref 0.7–4.0)
MCHC: 33.5 g/dL (ref 30.0–36.0)
MCV: 87.9 fl (ref 78.0–100.0)
Monocytes Absolute: 0.8 10*3/uL (ref 0.1–1.0)
Monocytes Relative: 7.3 % (ref 3.0–12.0)
Neutro Abs: 5.7 10*3/uL (ref 1.4–7.7)
Neutrophils Relative %: 54.7 % (ref 43.0–77.0)
Platelets: 360 10*3/uL (ref 150.0–400.0)
RBC: 4.67 Mil/uL (ref 3.87–5.11)
RDW: 14 % (ref 11.5–15.5)
WBC: 10.4 10*3/uL (ref 4.0–10.5)

## 2021-10-15 LAB — COMPREHENSIVE METABOLIC PANEL
ALT: 10 U/L (ref 0–35)
AST: 11 U/L (ref 0–37)
Albumin: 3.8 g/dL (ref 3.5–5.2)
Alkaline Phosphatase: 73 U/L (ref 39–117)
BUN: 15 mg/dL (ref 6–23)
CO2: 27 mEq/L (ref 19–32)
Calcium: 9.2 mg/dL (ref 8.4–10.5)
Chloride: 101 mEq/L (ref 96–112)
Creatinine, Ser: 0.65 mg/dL (ref 0.40–1.20)
GFR: 93.45 mL/min (ref >60.00)
Glucose, Bld: 157 mg/dL — ABNORMAL HIGH (ref 70–99)
Potassium: 3.7 mEq/L (ref 3.5–5.1)
Sodium: 137 mEq/L (ref 135–145)
Total Bilirubin: 0.3 mg/dL (ref 0.2–1.2)
Total Protein: 7 g/dL (ref 6.0–8.3)

## 2021-10-27 ENCOUNTER — Other Ambulatory Visit (HOSPITAL_COMMUNITY)
Admission: RE | Admit: 2021-10-27 | Discharge: 2021-10-27 | Disposition: A | Discharge status: Home / Self Care | Payer: 59 | Source: Ambulatory Visit | Attending: Obstetrics & Gynecology | Admitting: Obstetrics & Gynecology

## 2021-10-27 ENCOUNTER — Ambulatory Visit: Payer: 59 | Attending: Family

## 2021-10-27 ENCOUNTER — Ambulatory Visit (INDEPENDENT_AMBULATORY_CARE_PROVIDER_SITE_OTHER): Payer: 59 | Admitting: Obstetrics & Gynecology

## 2021-10-27 ENCOUNTER — Encounter: Payer: Self-pay | Admitting: Obstetrics & Gynecology

## 2021-10-27 VITALS — BP 126/77 | HR 92 | Ht 62.0 in | Wt 189.0 lb

## 2021-10-27 DIAGNOSIS — Z23 Encounter for immunization: Secondary | ICD-10-CM

## 2021-10-27 DIAGNOSIS — Z01419 Encounter for gynecological examination (general) (routine) without abnormal findings: Secondary | ICD-10-CM | POA: Diagnosis present

## 2021-10-27 DIAGNOSIS — Z1231 Encounter for screening mammogram for malignant neoplasm of breast: Secondary | ICD-10-CM | POA: Diagnosis not present

## 2021-10-27 NOTE — Progress Notes (Signed)
? ?  Covid-19 Vaccination Clinic ? ?Name:  Misty Bell    ?MRN: [id number] ?DOB: [birth year] ? ?10/27/2021 ? ?[name] was observed post Covid-19 immunization for 15 minutes without incident. She was provided with Vaccine Information Sheet and instruction to access the V-Safe system.  ? ?[name] was instructed to call 911 with any severe reactions post vaccine: ?Difficulty breathing  ?Swelling of face and throat  ?A fast heartbeat  ?A bad rash all over body  ?Dizziness and weakness  ? ?Immunizations Administered   ? ? Name Date Dose VIS Date Route  ? Moderna Covid-19 vaccine Bivalent Booster 10/27/2021  8:30 AM 0.5 mL 02/06/2021 Intramuscular  ? Manufacturer: Moderna  ? Lot: [id number]  ? [place]: [phone]  ? ?  ?  ?

## 2021-10-27 NOTE — Progress Notes (Signed)
Subjective:  ?  ? MCKENNAH KRETCHMER is a 66 y.o. female here for a routine exam.  Current complaints: none. Since her last visit, pt had a perineoplasty with Dr. Wannetta Sender. Pt reports no further issues since that time. She completes her masters on Friday. Her last colonoscopy was 2 years prev.   ?  ?Gynecologic History ?No LMP recorded. Patient has had an ablation. ?Contraception: post menopausal status ?Last Pap: 12/04/2019. Results were: normal ?Last mammogram: 01/16/2020. Results were: normal ? ?Obstetric History ?OB History  ?Gravida Para Term Preterm AB Living  ?'4 3       3  '$ ?SAB IAB Ectopic Multiple Live Births  ?        3  ?  ?# Outcome Date GA Lbr Len/2nd Weight Sex Delivery Anes PTL Lv  ?4 Gravida           ?3 Para      Vag-Spont     ?2 Para      Vag-Spont     ?1 Para      Vag-Spont     ? ? ? ?The following portions of the patient's history were reviewed and updated as appropriate: allergies, current medications, past family history, past medical history, past social history, past surgical history, and problem list. ? ?Review of Systems ?Pertinent items are noted in HPI.  ?  ?Objective:  ?BP 126/77   Pulse 92   Ht '5\' 2"'$  (1.575 m)   Wt 189 lb (85.7 kg)   BMI 34.57 kg/m?  ? ?General Appearance:    Alert, cooperative, no distress, appears stated age  ?Head:    Normocephalic, without obvious abnormality, atraumatic  ?Eyes:    conjunctiva/corneas clear, EOM's intact, both eyes  ?Ears:    Normal external ear canals, both ears  ?Nose:   Nares normal, septum midline, mucosa normal, no drainage    or sinus tenderness  ?Throat:   Lips, mucosa, and tongue normal; teeth and gums normal  ?Neck:   Supple, symmetrical, trachea midline, no adenopathy;  ?  thyroid:  no enlargement/tenderness/nodules  ?Back:     Symmetric, no curvature, ROM normal, no CVA tenderness  ?Lungs:     respirations unlabored  ?Chest Wall:    No tenderness or deformity  ? Heart:    Regular rate and rhythm  ?Breast Exam:    No tenderness, masses,  or nipple abnormality  ?Abdomen:     Soft, non-tender, bowel sounds active all four quadrants,  ?  no masses, no organomegaly  ?Genitalia:    Normal female without lesion, discharge or tenderness  ?   ?Extremities:   Extremities normal, atraumatic, no cyanosis or edema  ?Pulses:   2+ and symmetric all extremities  ?Skin:   Skin color, texture, turgor normal, no rashes or lesions  ?  ? ?Assessment:  ? ? Healthy female exam.  ? ?  ?Plan:  ?Diagnoses and all orders for this visit: ? ?Well female exam with routine gynecological exam ?-     Cytology - PAP( [facility]) ? ?Breast cancer screening by mammogram ?-     MM 3D SCREEN BREAST BILATERAL; Future ? ? F/u in 1 year or sooner prn  ? ?Jovana Rembold L. Harraway-Smith, M.D., [place] ? ?

## 2021-10-29 ENCOUNTER — Emergency Department (HOSPITAL_COMMUNITY): Payer: 59

## 2021-10-29 ENCOUNTER — Other Ambulatory Visit: Payer: Self-pay

## 2021-10-29 ENCOUNTER — Emergency Department (HOSPITAL_COMMUNITY)
Admission: EM | Admit: 2021-10-29 | Discharge: 2021-10-29 | Disposition: A | Discharge status: Home / Self Care | Payer: 59 | Attending: Emergency Medicine | Admitting: Emergency Medicine

## 2021-10-29 ENCOUNTER — Encounter (HOSPITAL_COMMUNITY): Payer: Self-pay | Admitting: Emergency Medicine

## 2021-10-29 DIAGNOSIS — Z79899 Other long term (current) drug therapy: Secondary | ICD-10-CM | POA: Insufficient documentation

## 2021-10-29 DIAGNOSIS — E119 Type 2 diabetes mellitus without complications: Secondary | ICD-10-CM | POA: Insufficient documentation

## 2021-10-29 DIAGNOSIS — T50901A Poisoning by unspecified drugs, medicaments and biological substances, accidental (unintentional), initial encounter: Secondary | ICD-10-CM | POA: Diagnosis not present

## 2021-10-29 DIAGNOSIS — Z7984 Long term (current) use of oral hypoglycemic drugs: Secondary | ICD-10-CM | POA: Insufficient documentation

## 2021-10-29 DIAGNOSIS — I10 Essential (primary) hypertension: Secondary | ICD-10-CM | POA: Insufficient documentation

## 2021-10-29 LAB — URINALYSIS, ROUTINE W REFLEX MICROSCOPIC
Bacteria, UA: NONE SEEN
Bilirubin Urine: NEGATIVE
Glucose, UA: >=500 mg/dL — AB
Hgb urine dipstick: NEGATIVE
Ketones, ur: NEGATIVE mg/dL
Leukocytes,Ua: NEGATIVE
Nitrite: NEGATIVE
Protein, ur: NEGATIVE mg/dL
Specific Gravity, Urine: 1.015 (ref 1.005–1.030)
pH: 6 (ref 5.0–8.0)

## 2021-10-29 LAB — CBC WITH DIFFERENTIAL/PLATELET
Abs Immature Granulocytes: 0.12 10*3/uL — ABNORMAL HIGH (ref 0.00–0.07)
Basophils Absolute: 0.1 10*3/uL (ref 0.0–0.1)
Basophils Relative: 0 %
Eosinophils Absolute: 0 10*3/uL (ref 0.0–0.5)
Eosinophils Relative: 0 %
HCT: 43 % (ref 36.0–46.0)
Hemoglobin: 13.9 g/dL (ref 12.0–15.0)
Immature Granulocytes: 1 %
Lymphocytes Relative: 9 %
Lymphs Abs: 1.9 10*3/uL (ref 0.7–4.0)
MCH: 29.7 pg (ref 26.0–34.0)
MCHC: 32.3 g/dL (ref 30.0–36.0)
MCV: 91.9 fL (ref 80.0–100.0)
Monocytes Absolute: 0.9 10*3/uL (ref 0.1–1.0)
Monocytes Relative: 4 %
Neutro Abs: 18.7 10*3/uL — ABNORMAL HIGH (ref 1.7–7.7)
Neutrophils Relative %: 86 %
Platelets: 347 10*3/uL (ref 150–400)
RBC: 4.68 MIL/uL (ref 3.87–5.11)
RDW: 14.6 % (ref 11.5–15.5)
WBC: 21.6 10*3/uL — ABNORMAL HIGH (ref 4.0–10.5)
nRBC: 0 % (ref 0.0–0.2)

## 2021-10-29 LAB — LACTIC ACID, PLASMA
Lactic Acid, Venous: 3 mmol/L (ref 0.5–1.9)
Lactic Acid, Venous: 1.7 mmol/L (ref 0.5–1.9)

## 2021-10-29 LAB — COMPREHENSIVE METABOLIC PANEL
ALT: 17 U/L (ref 0–44)
AST: 17 U/L (ref 15–41)
Albumin: 3.4 g/dL — ABNORMAL LOW (ref 3.5–5.0)
Alkaline Phosphatase: 82 U/L (ref 38–126)
Anion gap: 10 (ref 5–15)
BUN: 16 mg/dL (ref 8–23)
CO2: 26 mmol/L (ref 22–32)
Calcium: 8.9 mg/dL (ref 8.9–10.3)
Chloride: 103 mmol/L (ref 98–111)
Creatinine, Ser: 0.75 mg/dL (ref 0.44–1.00)
GFR, Estimated: >60 mL/min (ref >60)
Glucose, Bld: 358 mg/dL — ABNORMAL HIGH (ref 70–99)
Potassium: 3.8 mmol/L (ref 3.5–5.1)
Sodium: 139 mmol/L (ref 135–145)
Total Bilirubin: 0.4 mg/dL (ref 0.3–1.2)
Total Protein: 7.1 g/dL (ref 6.5–8.1)

## 2021-10-29 LAB — RAPID URINE DRUG SCREEN, HOSP PERFORMED
Amphetamines: NOT DETECTED
Barbiturates: NOT DETECTED
Benzodiazepines: NOT DETECTED
Cocaine: POSITIVE — AB
Opiates: NOT DETECTED
Tetrahydrocannabinol: NOT DETECTED

## 2021-10-29 LAB — I-STAT VENOUS BLOOD GAS, ED
Acid-Base Excess: 1 mmol/L (ref 0.0–2.0)
Bicarbonate: 28.2 mmol/L — ABNORMAL HIGH (ref 20.0–28.0)
Calcium, Ion: 1.13 mmol/L — ABNORMAL LOW (ref 1.15–1.40)
HCT: 40 % (ref 36.0–46.0)
Hemoglobin: 13.6 g/dL (ref 12.0–15.0)
O2 Saturation: 66 %
Potassium: 3.9 mmol/L (ref 3.5–5.1)
Sodium: 140 mmol/L (ref 135–145)
TCO2: 30 mmol/L (ref 22–32)
pCO2, Ven: 54 mmHg (ref 44–60)
pH, Ven: 7.325 (ref 7.25–7.43)
pO2, Ven: 38 mmHg (ref 32–45)

## 2021-10-29 LAB — CYTOLOGY - PAP
Comment: NEGATIVE
Diagnosis: UNDETERMINED — AB
High risk HPV: NEGATIVE

## 2021-10-29 LAB — ACETAMINOPHEN LEVEL: Acetaminophen (Tylenol), Serum: <10 ug/mL — ABNORMAL LOW (ref 10–30)

## 2021-10-29 LAB — CBG MONITORING, ED: Glucose-Capillary: 303 mg/dL — ABNORMAL HIGH (ref 70–99)

## 2021-10-29 LAB — SALICYLATE LEVEL: Salicylate Lvl: <7 mg/dL — ABNORMAL LOW (ref 7.0–30.0)

## 2021-10-29 MED ORDER — LACTATED RINGERS IV BOLUS: 1000.0000 mL | Freq: Once | INTRAVENOUS | Status: DC

## 2021-10-29 MED ORDER — SODIUM CHLORIDE 0.9 % IV BOLUS
1000.0000 mL | Freq: Once | INTRAVENOUS | Status: AC
  Administered 2021-10-29: 1000 mL via INTRAVENOUS

## 2021-10-29 NOTE — ED Triage Notes (Signed)
Patient BIB [facility] from home, complaint of accidental overdose. Per EMS patient was found by family unresponsive, per EMS pt not breathing upon their arrival. EMS assisted ventilations for approx 5 min, pt given 2 mg of Narcan IV, pt more responsive. Pt A&Ox4 upon arrival to department. EMS also noted CBG reads high upon arrival, 500 after 700cc of fluids. VSS.    ?

## 2021-10-29 NOTE — ED Provider Notes (Signed)
?[facility] ?Provider Note ? ? ?CSN: [id number] ?Arrival date & time: 10/29/21  1424 ? ?  ? ?History ? ?Chief Complaint  ?Patient presents with  ? Drug Overdose  ? ? ?Misty Bell is a 66 y.o. female. ? ? ?Drug Overdose ? ?Patient is a 66 year old female with history of anxiety, type 2 diabetes on metformin, essential hypertension, who presents to the emergency department by EMS after family found her to be unresponsive.  She did get approximately 5 minutes of CPR by her husband after instructed by 911 dispatcher.  EMS arrived and gave her 2 mg Narcan at which point she responded well.  She did require 5 minutes of BVM.  She was more awake and alert and did not require further Narcan.  Patient was found to have a blood sugar of 500 after getting 700 cc of fluids.  Patient reports she took something for her lower back pain and does not remember what she took.  She reports she was supposed to go to a dentist to get her dentures removed.  Husband reports the incident happened at around 1:00.  He report he was in a different room and found her on the ground unresponsive.  He does not know what medication she took.  They deny any similar symptoms in the past.  She denies any recent sickness.  Denies vomiting or diarrhea.  She continues to say she wants something to drink.  Otherwise no other complaints. ? ? ?Home Medications ?Prior to Admission medications   ?Medication Sig Start Date End Date Taking? Authorizing Provider  ?acetaminophen (TYLENOL) 500 MG tablet Take 1 tablet (500 mg total) by mouth every 6 (six) hours as needed (pain). 03/12/21   [name], MD  ?amLODipine (NORVASC) 10 MG tablet TAKE 1 TABLET BY MOUTH EVERY DAY 03/08/21   Copland, Gay Filler, MD  ?calcium citrate-vitamin D (CITRACAL+D) 315-200 MG-UNIT tablet Take 1 tablet by mouth 2 (two) times daily.    [provider]  ?furosemide (LASIX) 20 MG tablet TAKE 1 TABLET BY MOUTH EVERY DAY 03/09/21    Copland, Gay Filler, MD  ?lovastatin (MEVACOR) 20 MG tablet TAKE 1 TABLET BY MOUTH EVERYDAY AT BEDTIME 03/08/21   Copland, Gay Filler, MD  ?meloxicam (MOBIC) 15 MG tablet Take 1 tablet (15 mg total) by mouth daily. 10/14/21   Marrian Salvage, [place]  ?metFORMIN (GLUCOPHAGE) 500 MG tablet TAKE 1 TABLET BY MOUTH EVERY DAY WITH BREAKFAST 03/08/21   Copland, Gay Filler, MD  ?methocarbamol (ROBAXIN) 500 MG tablet Take 1 tablet (500 mg total) by mouth at bedtime as needed for muscle spasms. 10/14/21   Marrian Salvage, [place]  ?Multiple Vitamins-Minerals (CELEBRATE MULTI-COMPLETE 18) CHEW Chew by mouth daily.    [provider]  ?nicotine polacrilex (NICORETTE) 4 MG gum Take 1 each (4 mg total) by mouth as needed for smoking cessation. 01/07/21   Copland, Gay Filler, MD  ?sertraline (ZOLOFT) 100 MG tablet TAKE 1 TABLET BY MOUTH EVERY DAY 09/27/21   Copland, Gay Filler, MD  ?   ? ?Allergies    ?Patient has no known allergies.   ? ?Review of Systems   ?Review of Systems ? ?Physical Exam ?Updated Vital Signs ?BP 135/70 (BP Location: Right Arm)   Pulse 95   Temp 98.3 ?F (36.8 ?C) (Oral)   Resp 18   SpO2 97%  ?Physical Exam ?Constitutional:   ?   General: She is not in acute distress. ?HENT:  ?  Head: Normocephalic.  ?   Nose: Nose normal. No congestion.  ?   Mouth/Throat:  ?   Pharynx: Oropharynx is clear.  ?Eyes:  ?   Extraocular Movements: Extraocular movements intact.  ?Cardiovascular:  ?   Rate and Rhythm: Normal rate.  ?Pulmonary:  ?   Effort: Pulmonary effort is normal.  ?   Breath sounds: No wheezing.  ?Chest:  ?   Chest wall: No tenderness.  ?Abdominal:  ?   Tenderness: There is no guarding or rebound.  ?Musculoskeletal:     ?   General: No tenderness or signs of injury.  ?   Cervical back: Normal range of motion. No tenderness.  ?   Right lower leg: No edema.  ?   Left lower leg: No edema.  ?Skin: ?   General: Skin is warm.  ?Neurological:  ?   General: No focal deficit present.  ?   Mental Status: She is  alert and oriented to person, place, and time.  ?   GCS: GCS eye subscore is 4. GCS verbal subscore is 5. GCS motor subscore is 6.  ?   Cranial Nerves: No facial asymmetry.  ?   Sensory: No sensory deficit.  ?   Motor: No weakness.  ? ? ?ED Results / Procedures / Treatments   ?Labs ?(all labs ordered are listed, but only abnormal results are displayed) ?Labs Reviewed  ?CBC WITH DIFFERENTIAL/PLATELET - Abnormal; Notable for the following components:  ?    Result Value  ? WBC 21.6 (*)   ? Neutro Abs 18.7 (*)   ? Abs Immature Granulocytes 0.12 (*)   ? All other components within normal limits  ?COMPREHENSIVE METABOLIC PANEL - Abnormal; Notable for the following components:  ? Glucose, Bld 358 (*)   ? Albumin 3.4 (*)   ? All other components within normal limits  ?ACETAMINOPHEN LEVEL - Abnormal; Notable for the following components:  ? Acetaminophen (Tylenol), Serum <10 (*)   ? All other components within normal limits  ?SALICYLATE LEVEL - Abnormal; Notable for the following components:  ? Salicylate Lvl <8.7 (*)   ? All other components within normal limits  ?RAPID URINE DRUG SCREEN, HOSP PERFORMED - Abnormal; Notable for the following components:  ? Cocaine POSITIVE (*)   ? All other components within normal limits  ?URINALYSIS, ROUTINE W REFLEX MICROSCOPIC - Abnormal; Notable for the following components:  ? Color, Urine STRAW (*)   ? Glucose, UA >=500 (*)   ? All other components within normal limits  ?LACTIC ACID, PLASMA - Abnormal; Notable for the following components:  ? Lactic Acid, Venous 3.0 (*)   ? All other components within normal limits  ?CBG MONITORING, ED - Abnormal; Notable for the following components:  ? Glucose-Capillary 303 (*)   ? All other components within normal limits  ?I-STAT VENOUS BLOOD GAS, ED - Abnormal; Notable for the following components:  ? Bicarbonate 28.2 (*)   ? Calcium, Ion 1.13 (*)   ? All other components within normal limits  ?LACTIC ACID, PLASMA  ? ? ?EKG ?EKG  Interpretation ? ?Date/Time:  Friday Oct 29 2021 14:58:38 EDT ?Ventricular Rate:  100 ?PR Interval:  180 ?QRS Duration: 86 ?QT Interval:  377 ?QTC Calculation: 487 ?R Axis:   34 ?Text Interpretation: Sinus tachycardia Biatrial enlargement Borderline prolonged QT interval No significant change since last tracing Confirmed by Gareth Morgan [phone]) on 10/29/2021 3:54:13 PM ? ?Radiology ?DG Chest Portable 1 View ? ?Result Date: 10/29/2021 ?CLINICAL DATA:  Unresponsive EXAM: PORTABLE CHEST 1 VIEW COMPARISON:  04/09/2015 FINDINGS: The heart size and mediastinal contours are within normal limits. Both lungs are clear. The visualized skeletal structures are unremarkable. IMPRESSION: No active disease. Electronically Signed   By: Donavan Foil M.D.   On: 10/29/2021 18:02   ? ?Procedures ?Procedures  ? ?Medications Ordered in ED ?Medications  ?lactated ringers bolus 1,000 mL (1,000 mLs Intravenous Not Given 10/29/21 1925)  ?sodium chloride 0.9 % bolus 1,000 mL (0 mLs Intravenous Stopped 10/29/21 1722)  ? ? ?ED Course/ Medical Decision Making/ A&P ?  ?                        ?Medical Decision Making ?Problems Addressed: ?Accidental overdose, initial encounter: acute illness or injury that poses a threat to life or bodily functions ? ?Amount and/or Complexity of Data Reviewed ?Labs: ordered. Decision-making details documented in ED Course. ?Radiology: ordered and independent interpretation performed. Decision-making details documented in ED Course. ?ECG/medicine tests: ordered and independent interpretation performed. Decision-making details documented in ED Course. ? ? ?Patient is 66 year old female with a history as above who presents to the emergency department due to a suspected overdose.  Patient was mildly responsive however immediately showed improvement after EMS gave her Narcan.  On arrival, hemodynamically stable, alert and oriented with GCS of 15.  She was protecting her airway.  No sign of respiratory distress.  Unclear  the intention of overdose or what she overdosed on.  At this point less concern for cardiac mediated syncope given significant improvement of her symptoms after Narcan.  Currently denies chest pain or shortness of breath

## 2021-11-01 ENCOUNTER — Telehealth: Payer: Self-pay

## 2021-11-01 ENCOUNTER — Ambulatory Visit (INDEPENDENT_AMBULATORY_CARE_PROVIDER_SITE_OTHER): Payer: 59 | Admitting: Family Medicine

## 2021-11-01 VITALS — BP 122/74 | HR 97 | Temp 97.8°F | Resp 18 | Ht 62.0 in | Wt 187.0 lb

## 2021-11-01 DIAGNOSIS — E669 Obesity, unspecified: Secondary | ICD-10-CM | POA: Diagnosis not present

## 2021-11-01 DIAGNOSIS — D72829 Elevated white blood cell count, unspecified: Secondary | ICD-10-CM

## 2021-11-01 DIAGNOSIS — Z09 Encounter for follow-up examination after completed treatment for conditions other than malignant neoplasm: Secondary | ICD-10-CM

## 2021-11-01 DIAGNOSIS — E1169 Type 2 diabetes mellitus with other specified complication: Secondary | ICD-10-CM
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
## 2021-11-01 NOTE — Patient Instructions (Signed)
It was good to see you today- I am glad you are feeling better ? ?I will be in touch with your labs  ?

## 2021-11-01 NOTE — Telephone Encounter (Signed)
Called  pt- she states she is feeling back to normal.  I also spoke with her daughter who is concerned ?Pt agrees to see me at 3pm today  ?

## 2021-11-01 NOTE — Telephone Encounter (Signed)
[place] ?Gender: Female ?DOB: Jul 27, 1955 ?Age: 66 Y 88 M 3 D ?ReturnPhone [phone] ?(Primary), ?[phone] ?Caller Name Misty Bell ?Relationship To Patient Daughter ?Return Phone Number [phone] (Primary) ?Chief Complaint CONFUSION - new onset ? ?Mother has been really lethargic and was rushed ?to the ED Friday night. Caller stated that her ?Mother had to be given Narcan when EMS found ?her and her Blood sugar was over 500, BP was ?elevated and she was unresponsive. Caller stated ?that her Mother is not acting like herself. Caller ?stated that that her Mother will stop responding ?mid conversation and will go into a stupor. Caller ?stated her Mother has been like this since Friday. ?Additional Comment Primary number is Dtrs number. Please call first. ?Translation No ?Nurse Assessment ?Nurse: Eugenio Hoes, RN, Sarah Date/Time Eilene Ghazi Time): 10/31/2021 4:20:26 PM ?

## 2021-11-02 ENCOUNTER — Encounter: Payer: Self-pay | Admitting: Family Medicine

## 2021-11-02 LAB — CBC
HCT: 41.8 % (ref 36.0–46.0)
Hemoglobin: 14 g/dL (ref 12.0–15.0)
MCHC: 33.6 g/dL (ref 30.0–36.0)
MCV: 88.6 fl (ref 78.0–100.0)
Platelets: 377 10*3/uL (ref 150.0–400.0)
RBC: 4.72 Mil/uL (ref 3.87–5.11)
RDW: 14.2 % (ref 11.5–15.5)
WBC: 11.1 10*3/uL — ABNORMAL HIGH (ref 4.0–10.5)

## 2021-11-02 LAB — HEMOGLOBIN A1C: Hgb A1c MFr Bld: 7.8 % — ABNORMAL HIGH (ref 4.6–6.5)

## 2021-11-09 ENCOUNTER — Other Ambulatory Visit: Payer: Self-pay | Admitting: Family

## 2021-11-10 ENCOUNTER — Other Ambulatory Visit: Payer: Self-pay | Admitting: Family

## 2021-12-13 ENCOUNTER — Telehealth (HOSPITAL_BASED_OUTPATIENT_CLINIC_OR_DEPARTMENT_OTHER): Payer: Self-pay

## 2021-12-21 ENCOUNTER — Other Ambulatory Visit: Payer: Self-pay | Admitting: Family Medicine

## 2021-12-21 DIAGNOSIS — E119 Type 2 diabetes mellitus without complications: Secondary | ICD-10-CM

## 2022-01-20 ENCOUNTER — Encounter: Payer: Self-pay | Admitting: Family Medicine

## 2022-01-20 DIAGNOSIS — E785 Hyperlipidemia, unspecified: Secondary | ICD-10-CM

## 2022-01-20 MED ORDER — LOVASTATIN 20 MG PO TABS: ORAL_TABLET | ORAL | 1 refills | Status: DC

## 2022-02-03 ENCOUNTER — Telehealth (HOSPITAL_BASED_OUTPATIENT_CLINIC_OR_DEPARTMENT_OTHER): Payer: Self-pay

## 2022-03-21 ENCOUNTER — Encounter: Payer: Self-pay | Admitting: Family Medicine

## 2022-03-21 DIAGNOSIS — E785 Hyperlipidemia, unspecified: Secondary | ICD-10-CM

## 2022-03-21 DIAGNOSIS — R7303 Prediabetes: Secondary | ICD-10-CM

## 2022-03-21 MED ORDER — METFORMIN HCL 500 MG PO TABS: ORAL_TABLET | ORAL | 1 refills | Status: DC

## 2022-03-21 MED ORDER — LOVASTATIN 20 MG PO TABS: ORAL_TABLET | ORAL | 1 refills | Status: DC

## 2022-03-22 ENCOUNTER — Encounter: Payer: Self-pay | Admitting: Family Medicine
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
## 2022-03-23 ENCOUNTER — Ambulatory Visit (HOSPITAL_BASED_OUTPATIENT_CLINIC_OR_DEPARTMENT_OTHER)
Admission: RE | Admit: 2022-03-23 | Discharge: 2022-03-23 | Disposition: A | Discharge status: Home / Self Care | Payer: Commercial Managed Care - HMO | Source: Ambulatory Visit | Attending: Obstetrics & Gynecology | Admitting: Obstetrics & Gynecology

## 2022-03-23 ENCOUNTER — Encounter (HOSPITAL_BASED_OUTPATIENT_CLINIC_OR_DEPARTMENT_OTHER): Payer: Self-pay

## 2022-03-23 ENCOUNTER — Inpatient Hospital Stay (HOSPITAL_BASED_OUTPATIENT_CLINIC_OR_DEPARTMENT_OTHER): Admission: RE | Admit: 2022-03-23 | Payer: Commercial Managed Care - HMO | Source: Ambulatory Visit

## 2022-03-23 ENCOUNTER — Ambulatory Visit: Payer: Commercial Managed Care - HMO | Admitting: Family Medicine

## 2022-03-23 DIAGNOSIS — Z1231 Encounter for screening mammogram for malignant neoplasm of breast: Secondary | ICD-10-CM | POA: Insufficient documentation

## 2022-03-25 ENCOUNTER — Other Ambulatory Visit: Payer: Self-pay | Admitting: Obstetrics & Gynecology

## 2022-03-25 DIAGNOSIS — R928 Other abnormal and inconclusive findings on diagnostic imaging of breast: Secondary | ICD-10-CM
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
## 2022-04-05 NOTE — Patient Instructions (Signed)
It was good to see you today, I will be in touch with your labs as soon as possible Recommended dose of the latest COVID shot as well as an RSV vaccine this fall

## 2022-04-06 ENCOUNTER — Ambulatory Visit (INDEPENDENT_AMBULATORY_CARE_PROVIDER_SITE_OTHER): Payer: Commercial Managed Care - HMO | Admitting: Family Medicine

## 2022-04-06 ENCOUNTER — Encounter: Payer: Self-pay | Admitting: Family Medicine

## 2022-04-06 ENCOUNTER — Encounter: Payer: Self-pay | Admitting: Gastroenterology

## 2022-04-06 ENCOUNTER — Other Ambulatory Visit (HOSPITAL_BASED_OUTPATIENT_CLINIC_OR_DEPARTMENT_OTHER): Payer: Self-pay

## 2022-04-06 VITALS — BP 124/70 | HR 96 | Temp 98.4°F | Resp 18 | Ht 62.0 in | Wt 185.4 lb

## 2022-04-06 DIAGNOSIS — E1169 Type 2 diabetes mellitus with other specified complication: Secondary | ICD-10-CM | POA: Diagnosis not present

## 2022-04-06 DIAGNOSIS — R5383 Other fatigue: Secondary | ICD-10-CM

## 2022-04-06 DIAGNOSIS — I10 Essential (primary) hypertension: Secondary | ICD-10-CM

## 2022-04-06 DIAGNOSIS — Z111 Encounter for screening for respiratory tuberculosis: Secondary | ICD-10-CM

## 2022-04-06 DIAGNOSIS — Z1329 Encounter for screening for other suspected endocrine disorder: Secondary | ICD-10-CM

## 2022-04-06 DIAGNOSIS — U071 COVID-19: Secondary | ICD-10-CM

## 2022-04-06 DIAGNOSIS — E119 Type 2 diabetes mellitus without complications: Secondary | ICD-10-CM

## 2022-04-06 DIAGNOSIS — E876 Hypokalemia: Secondary | ICD-10-CM

## 2022-04-06 DIAGNOSIS — Z0000 Encounter for general adult medical examination without abnormal findings: Secondary | ICD-10-CM | POA: Diagnosis not present

## 2022-04-06 DIAGNOSIS — Z23 Encounter for immunization: Secondary | ICD-10-CM

## 2022-04-06 DIAGNOSIS — E669 Obesity, unspecified: Secondary | ICD-10-CM | POA: Diagnosis not present

## 2022-04-06 DIAGNOSIS — E785 Hyperlipidemia, unspecified: Secondary | ICD-10-CM | POA: Diagnosis not present

## 2022-04-06 DIAGNOSIS — Z720 Tobacco use: Secondary | ICD-10-CM

## 2022-04-06 LAB — COMPREHENSIVE METABOLIC PANEL
ALT: 11 U/L (ref 0–35)
AST: 10 U/L (ref 0–37)
Albumin: 3.8 g/dL (ref 3.5–5.2)
Alkaline Phosphatase: 90 U/L (ref 39–117)
BUN: 14 mg/dL (ref 6–23)
CO2: 32 mEq/L (ref 19–32)
Calcium: 9.7 mg/dL (ref 8.4–10.5)
Chloride: 102 mEq/L (ref 96–112)
Creatinine, Ser: 0.72 mg/dL (ref 0.40–1.20)
GFR: 88.45 mL/min (ref >60.00)
Glucose, Bld: 142 mg/dL — ABNORMAL HIGH (ref 70–99)
Potassium: 3.3 mEq/L — ABNORMAL LOW (ref 3.5–5.1)
Sodium: 141 mEq/L (ref 135–145)
Total Bilirubin: 0.3 mg/dL (ref 0.2–1.2)
Total Protein: 7 g/dL (ref 6.0–8.3)

## 2022-04-06 LAB — CBC
HCT: 42.4 % (ref 36.0–46.0)
Hemoglobin: 14.3 g/dL (ref 12.0–15.0)
MCHC: 33.8 g/dL (ref 30.0–36.0)
MCV: 89.7 fl (ref 78.0–100.0)
Platelets: 349 10*3/uL (ref 150.0–400.0)
RBC: 4.73 Mil/uL (ref 3.87–5.11)
RDW: 14.6 % (ref 11.5–15.5)
WBC: 9.2 10*3/uL (ref 4.0–10.5)

## 2022-04-06 LAB — LDL CHOLESTEROL, DIRECT: Direct LDL: 122 mg/dL

## 2022-04-06 LAB — LIPID PANEL
Cholesterol: 217 mg/dL — ABNORMAL HIGH (ref 0–200)
HDL: 66.8 mg/dL (ref >39.00)
NonHDL: 150.18
Total CHOL/HDL Ratio: 3
Triglycerides: 293 mg/dL — ABNORMAL HIGH (ref 0.0–149.0)
VLDL: 58.6 mg/dL — ABNORMAL HIGH (ref 0.0–40.0)

## 2022-04-06 LAB — VITAMIN D 25 HYDROXY (VIT D DEFICIENCY, FRACTURES): VITD: 32.3 ng/mL (ref 30.00–100.00)

## 2022-04-06 LAB — HEMOGLOBIN A1C: Hgb A1c MFr Bld: 7.7 % — ABNORMAL HIGH (ref 4.6–6.5)

## 2022-04-06 LAB — TSH: TSH: 0.84 u[IU]/mL (ref 0.35–5.50)

## 2022-04-06 MED ORDER — NICOTINE POLACRILEX 4 MG MT GUM
4.0000 mg | CHEWING_GUM | OROMUCOSAL | 0 refills | Status: DC | PRN
  Filled 2022-04-06: qty 100, 90d supply, fill #0
  Filled 2022-04-06: qty 100, 7d supply, fill #0

## 2022-04-06 MED ORDER — NICOTINE POLACRILEX 4 MG MT GUM: 4.0000 mg | CHEWING_GUM | OROMUCOSAL | 0 refills | Status: DC | PRN

## 2022-04-07 ENCOUNTER — Other Ambulatory Visit: Payer: Self-pay

## 2022-04-07 ENCOUNTER — Telehealth: Payer: Self-pay

## 2022-04-07 LAB — MICROALBUMIN / CREATININE URINE RATIO
Creatinine,U: 422.3 mg/dL
Microalb Creat Ratio: 1.7 mg/g (ref 0.0–30.0)
Microalb, Ur: 7.1 mg/dL — ABNORMAL HIGH (ref 0.0–1.9)

## 2022-04-07 MED ORDER — NIRMATRELVIR/RITONAVIR (PAXLOVID)TABLET: 3.0000 | ORAL_TABLET | Freq: Two times a day (BID) | ORAL | 0 refills | Status: AC

## 2022-04-07 MED ORDER — POTASSIUM CHLORIDE CRYS ER 20 MEQ PO TBCR: 20.0000 meq | EXTENDED_RELEASE_TABLET | Freq: Every day | ORAL | 3 refills | Status: DC

## 2022-04-07 MED ORDER — METFORMIN HCL 500 MG PO TABS: ORAL_TABLET | ORAL | 3 refills | Status: DC

## 2022-04-07 NOTE — Telephone Encounter (Signed)
Pharmacy called had question about paxlovid And lovastatin. Stated the adverse interaction  Of the two medication. Spoke with laura ,Np she said yes to holding the lovastatin per pharmacy request.

## 2022-04-08 ENCOUNTER — Other Ambulatory Visit: Payer: Self-pay | Admitting: Family Medicine

## 2022-04-08 DIAGNOSIS — R600 Localized edema: Secondary | ICD-10-CM

## 2022-04-10 ENCOUNTER — Encounter: Payer: Self-pay | Admitting: Family Medicine

## 2022-04-10 LAB — QUANTIFERON-TB GOLD PLUS
Mitogen-NIL: >10 IU/mL
NIL: 0.18 IU/mL
QuantiFERON-TB Gold Plus: NEGATIVE
TB1-NIL: 0.02 IU/mL
TB2-NIL: 0.02 IU/mL

## 2022-04-11 ENCOUNTER — Ambulatory Visit
Admission: RE | Admit: 2022-04-11 | Discharge: 2022-04-11 | Disposition: A | Discharge status: Home / Self Care | Payer: Commercial Managed Care - HMO | Source: Ambulatory Visit | Attending: Obstetrics & Gynecology | Admitting: Obstetrics & Gynecology

## 2022-04-11 DIAGNOSIS — R928 Other abnormal and inconclusive findings on diagnostic imaging of breast: Secondary | ICD-10-CM

## 2022-04-12 ENCOUNTER — Other Ambulatory Visit: Payer: Self-pay | Admitting: Family Medicine

## 2022-04-12 DIAGNOSIS — R600 Localized edema: Secondary | ICD-10-CM

## 2022-04-18 ENCOUNTER — Encounter: Payer: Self-pay | Admitting: *Deleted

## 2022-04-26 ENCOUNTER — Ambulatory Visit (AMBULATORY_SURGERY_CENTER): Payer: Self-pay

## 2022-04-26 VITALS — Ht 62.0 in | Wt 187.0 lb

## 2022-04-26 DIAGNOSIS — Z86010 Personal history of colonic polyps: Secondary | ICD-10-CM

## 2022-04-26 MED ORDER — NA SULFATE-K SULFATE-MG SULF 17.5-3.13-1.6 GM/177ML PO SOLN: 1.0000 | Freq: Once | ORAL | 0 refills | Status: AC

## 2022-04-26 NOTE — Progress Notes (Signed)
No egg or soy allergy known to patient  No issues known to pt with past sedation with any surgeries or procedures Patient denies ever being told they had issues or difficulty with intubation  No FH of Malignant Hyperthermia Pt is not on diet pills Pt is not on home 02  Pt is not on blood thinners  Pt denies issues with constipation- reports having a "normal for me bowel movement every day"; patient advised to increase oral fluids, activity, fruits/veggies/ OTC PRN meds stool softeners or laxatives if needed for relief of constipation;  No A fib or A flutter Have any cardiac testing pending--NO Pt instructed to use Singlecare.com or GoodRx for a price reduction on prep   Insurance verified during [place] appt=[employer]  Patient's chart reviewed by Misty Bell CNRA prior to previsit and patient appropriate for the [place].  Previsit completed and red dot placed by patient's name on their procedure day (on provider's schedule).    GoodRx coupon given to patient during [place] appt;

## 2022-05-09 ENCOUNTER — Telehealth: Payer: Self-pay | Admitting: Family Medicine

## 2022-05-09 DIAGNOSIS — R600 Localized edema: Secondary | ICD-10-CM

## 2022-05-16 ENCOUNTER — Encounter: Payer: Self-pay | Admitting: Gastroenterology

## 2022-05-18 ENCOUNTER — Other Ambulatory Visit: Payer: Self-pay

## 2022-05-18 ENCOUNTER — Encounter: Payer: Self-pay | Admitting: Family Medicine

## 2022-05-18 DIAGNOSIS — E119 Type 2 diabetes mellitus without complications: Secondary | ICD-10-CM

## 2022-05-18 DIAGNOSIS — R600 Localized edema: Secondary | ICD-10-CM

## 2022-05-18 MED ORDER — FUROSEMIDE 20 MG PO TABS: 20.0000 mg | ORAL_TABLET | Freq: Every day | ORAL | 3 refills | Status: DC

## 2022-05-18 MED ORDER — AMLODIPINE BESYLATE 10 MG PO TABS: 10.0000 mg | ORAL_TABLET | Freq: Every day | ORAL | 1 refills | Status: DC

## 2022-05-18 NOTE — Telephone Encounter (Signed)
Pt called to follow up as to why she was unable to have her lasix filled. Pt had CPE completed on 10.11.23 and had refilled shortly after that appt on 10.13.23. Pharmacy has told her she has to follow up with PCP to get this refilled. Please advise.

## 2022-05-18 NOTE — Telephone Encounter (Signed)
Called the pharmacy and they verified that they did not have the refill request- so I resent the Rx.

## 2022-05-18 NOTE — Telephone Encounter (Signed)
Called Pt confirmed pharmacy. Will send Rx to [facility] on [address]  Jmw

## 2022-05-24 ENCOUNTER — Encounter: Payer: Self-pay | Admitting: Gastroenterology

## 2022-05-24 ENCOUNTER — Ambulatory Visit (AMBULATORY_SURGERY_CENTER): Payer: Commercial Managed Care - HMO | Admitting: Gastroenterology

## 2022-05-24 VITALS — BP 132/92 | HR 83 | Temp 97.8°F | Resp 15 | Ht 62.0 in | Wt 187.0 lb

## 2022-05-24 DIAGNOSIS — Z09 Encounter for follow-up examination after completed treatment for conditions other than malignant neoplasm: Secondary | ICD-10-CM

## 2022-05-24 DIAGNOSIS — Z86010 Personal history of colonic polyps: Secondary | ICD-10-CM | POA: Diagnosis not present

## 2022-05-24 DIAGNOSIS — Z538 Procedure and treatment not carried out for other reasons: Secondary | ICD-10-CM | POA: Diagnosis not present

## 2022-05-24 MED ORDER — SODIUM CHLORIDE 0.9 % IV SOLN: 500.0000 mL | Freq: Once | INTRAVENOUS | Status: DC

## 2022-05-24 NOTE — Patient Instructions (Signed)
Repeat colonoscopy on Wednesday 05/25/22 at 10:00am  YOU HAD AN ENDOSCOPIC PROCEDURE TODAY AT [facility]:   Refer to the procedure report that was given to you for any specific questions about what was found during the examination.  If the procedure report does not answer your questions, please call your gastroenterologist to clarify.  If you requested that your care partner not be given the details of your procedure findings, then the procedure report has been included in a sealed envelope for you to review at your convenience later.  YOU SHOULD EXPECT: Some feelings of bloating in the abdomen. Passage of more gas than usual.  Walking can help get rid of the air that was put into your GI tract during the procedure and reduce the bloating. If you had a lower endoscopy (such as a colonoscopy or flexible sigmoidoscopy) you may notice spotting of blood in your stool or on the toilet paper. If you underwent a bowel prep for your procedure, you may not have a normal bowel movement for a few days.  Please Note:  You might notice some irritation and congestion in your nose or some drainage.  This is from the oxygen used during your procedure.  There is no need for concern and it should clear up in a day or so.  SYMPTOMS TO REPORT IMMEDIATELY:  Following lower endoscopy (colonoscopy or flexible sigmoidoscopy):  Excessive amounts of blood in the stool  Significant tenderness or worsening of abdominal pains  Swelling of the abdomen that is new, acute  Fever of 100F or higher  For urgent or emergent issues, a gastroenterologist can be reached at any hour by calling [phone]. Do not use MyChart messaging for urgent concerns.    DIET:  We do recommend a small meal at first, but then you may proceed to your regular diet.  Drink plenty of fluids but you should avoid alcoholic beverages for 24 hours.  ACTIVITY:  You should plan to take it easy for the rest of today and you should NOT  DRIVE or use heavy machinery until tomorrow (because of the sedation medicines used during the test).    FOLLOW UP: Our staff will call the number listed on your records the next business day following your procedure.  We will call around 7:15- 8:00 am to check on you and address any questions or concerns that you may have regarding the information given to you following your procedure. If we do not reach you, we will leave a message.     If any biopsies were taken you will be contacted by phone or by letter within the next 1-3 weeks.  Please call us at [phone] if you have not heard about the biopsies in 3 weeks.    SIGNATURES/CONFIDENTIALITY: You and/or your care partner have signed paperwork which will be entered into your electronic medical record.  These signatures attest to the fact that that the information above on your After Visit Summary has been reviewed and is understood.  Full responsibility of the confidentiality of this discharge information lies with you and/or your care-partner.

## 2022-05-24 NOTE — Progress Notes (Unsigned)
Vss nad trans to pacu case aborted due to poor prep

## 2022-05-24 NOTE — Progress Notes (Unsigned)
Prep was poor. Repeat colonoscopy 05/25/22 at 10am. Pt to stay on clear liquids today and do Miralax prep. Instructions printed and reviewed with pt and her care partner.
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
## 2022-05-24 NOTE — Op Note (Signed)
[facility] Patient Name: Misty Bell Procedure Date: 05/24/2022 1:36 PM MRN: [id number] Endoscopist: Mauri Pole , MD, [id number] Age: 66 Referring MD:  Date of Birth: [birth year] Gender: Female Account #: [id number] Procedure:                Colonoscopy Indications:              High risk colon cancer surveillance: Personal                            history of colonic polyps, Surveillance: Personal                            history of adenomatous polyps on last colonoscopy 3                            years ago, High risk colon cancer surveillance:                            Personal history of adenoma (10 mm or greater in                            size), High risk colon cancer surveillance:                            Personal history of multiple (3 or more) adenomas Medicines:                Monitored Anesthesia Care Procedure:                Pre-Anesthesia Assessment:                           - Prior to the procedure, a History and Physical                            was performed, and patient medications and                            allergies were reviewed. The patient's tolerance of                            previous anesthesia was also reviewed. The risks                            and benefits of the procedure and the sedation                            options and risks were discussed with the patient.                            All questions were answered, and informed consent                            was obtained. Prior Anticoagulants: The patient has  taken no anticoagulant or antiplatelet agents. ASA                            Grade Assessment: III - A patient with severe                            systemic disease. After reviewing the risks and                            benefits, the patient was deemed in satisfactory                            condition to undergo the procedure.                           After  obtaining informed consent, the colonoscope                            was passed under direct vision. Throughout the                            procedure, the patient's blood pressure, pulse, and                            oxygen saturations were monitored continuously. The                            Olympus PCF-H190DL (#[id number]) Colonoscope was                            introduced through the anus with the intention of                            advancing to the cecum. The scope was advanced to                            the transverse colon before the procedure was                            aborted. Medications were given. The colonoscopy                            was technically difficult and complex due to poor                            bowel prep with stool present. The patient                            tolerated the procedure well. The quality of the                            bowel preparation was poor. Scope In: 1:42:56 PM Scope Out: 1:48:38 PM Total Procedure Duration: 0 hours 5 minutes 42 seconds  Findings:  The perianal and digital rectal examinations were                            normal.                           A moderate amount of stool was found in the rectum,                            in the sigmoid colon, in the descending colon and                            in the transverse colon, interfering with                            visualization. Complications:            No immediate complications. Estimated Blood Loss:     Estimated blood loss: none. Impression:               - Preparation of the colon was poor.                           - Stool in the rectum, in the sigmoid colon, in the                            descending colon and in the transverse colon.                           - No specimens collected. Recommendation:           - Patient has a contact number available for                            emergencies. The signs and symptoms of  potential                            delayed complications were discussed with the                            patient. Return to normal activities tomorrow.                            Written discharge instructions were provided to the                            patient.                           - Resume previous diet.                           - Continue present medications.                           - Repeat colonoscopy at the next available  appointment because the bowel preparation was                            suboptimal.                           - For future colonoscopy the patient will require                            an extended preparation. If there are any                            questions, please contact the gastroenterologist. Mauri Pole, MD 05/24/2022 1:53:45 PM This report has been signed electronically.

## 2022-05-24 NOTE — Progress Notes (Unsigned)
Pt's states no medical or surgical changes since previsit or office visit. 

## 2022-05-25 ENCOUNTER — Ambulatory Visit (AMBULATORY_SURGERY_CENTER): Payer: Commercial Managed Care - HMO | Admitting: Gastroenterology

## 2022-05-25 ENCOUNTER — Encounter: Payer: Self-pay | Admitting: Gastroenterology

## 2022-05-25 VITALS — BP 134/81 | HR 78 | Temp 97.5°F | Resp 16 | Ht 62.0 in | Wt 187.0 lb

## 2022-05-25 DIAGNOSIS — Z86010 Personal history of colonic polyps: Secondary | ICD-10-CM

## 2022-05-25 DIAGNOSIS — Z09 Encounter for follow-up examination after completed treatment for conditions other than malignant neoplasm: Secondary | ICD-10-CM | POA: Diagnosis not present

## 2022-05-25 DIAGNOSIS — D122 Benign neoplasm of ascending colon: Secondary | ICD-10-CM

## 2022-05-25 MED ORDER — SODIUM CHLORIDE 0.9 % IV SOLN: 500.0000 mL | Freq: Once | INTRAVENOUS | Status: DC

## 2022-05-25 NOTE — Patient Instructions (Signed)
Handouts on polyps, hemorrhoids, and diverticulosis given to you today  Await pathology results   YOU HAD AN ENDOSCOPIC PROCEDURE TODAY AT [facility]:   Refer to the procedure report that was given to you for any specific questions about what was found during the examination.  If the procedure report does not answer your questions, please call your gastroenterologist to clarify.  If you requested that your care partner not be given the details of your procedure findings, then the procedure report has been included in a sealed envelope for you to review at your convenience later.  YOU SHOULD EXPECT: Some feelings of bloating in the abdomen. Passage of more gas than usual.  Walking can help get rid of the air that was put into your GI tract during the procedure and reduce the bloating. If you had a lower endoscopy (such as a colonoscopy or flexible sigmoidoscopy) you may notice spotting of blood in your stool or on the toilet paper. If you underwent a bowel prep for your procedure, you may not have a normal bowel movement for a few days.  Please Note:  You might notice some irritation and congestion in your nose or some drainage.  This is from the oxygen used during your procedure.  There is no need for concern and it should clear up in a day or so.  SYMPTOMS TO REPORT IMMEDIATELY:  Following lower endoscopy (colonoscopy or flexible sigmoidoscopy):  Excessive amounts of blood in the stool  Significant tenderness or worsening of abdominal pains  Swelling of the abdomen that is new, acute  Fever of 100F or higher  For urgent or emergent issues, a gastroenterologist can be reached at any hour by calling [phone]. Do not use MyChart messaging for urgent concerns.    DIET:  We do recommend a small meal at first, but then you may proceed to your regular diet.  Drink plenty of fluids but you should avoid alcoholic beverages for 24 hours.  ACTIVITY:  You should plan to take it  easy for the rest of today and you should NOT DRIVE or use heavy machinery until tomorrow (because of the sedation medicines used during the test).    FOLLOW UP: Our staff will call the number listed on your records the next business day following your procedure.  We will call around 7:15- 8:00 am to check on you and address any questions or concerns that you may have regarding the information given to you following your procedure. If we do not reach you, we will leave a message.     If any biopsies were taken you will be contacted by phone or by letter within the next 1-3 weeks.  Please call us at [phone] if you have not heard about the biopsies in 3 weeks.    SIGNATURES/CONFIDENTIALITY: You and/or your care partner have signed paperwork which will be entered into your electronic medical record.  These signatures attest to the fact that that the information above on your After Visit Summary has been reviewed and is understood.  Full responsibility of the confidentiality of this discharge information lies with you and/or your care-partner.
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
## 2022-05-25 NOTE — Progress Notes (Unsigned)
Report to PACU, RN, vss, BBS= Clear.  

## 2022-05-25 NOTE — Progress Notes (Signed)
Called to room to assist during endoscopic procedure.  Patient ID and intended procedure confirmed with present staff. Received instructions for my participation in the procedure from the performing physician.  

## 2022-05-25 NOTE — Op Note (Signed)
[facility] Patient Name: Misty Bell Procedure Date: 05/25/2022 10:20 AM MRN: [id number] Endoscopist: Mauri Pole , MD, [id number] Age: 66 Referring MD:  Date of Birth: [birth year] Gender: Female Account #: [id number] Procedure:                Colonoscopy Indications:              High risk colon cancer surveillance: Personal                            history of colonic polyps, High risk colon cancer                            surveillance: Personal history of adenoma less than                            10 mm in size Medicines:                Monitored Anesthesia Care Procedure:                Pre-Anesthesia Assessment:                           - Prior to the procedure, a History and Physical                            was performed, and patient medications and                            allergies were reviewed. The patient's tolerance of                            previous anesthesia was also reviewed. The risks                            and benefits of the procedure and the sedation                            options and risks were discussed with the patient.                            All questions were answered, and informed consent                            was obtained. Prior Anticoagulants: The patient has                            taken no anticoagulant or antiplatelet agents. ASA                            Grade Assessment: II - A patient with mild systemic                            disease. After reviewing the risks and benefits,  the patient was deemed in satisfactory condition to                            undergo the procedure.                           After obtaining informed consent, the colonoscope                            was passed under direct vision. Throughout the                            procedure, the patient's blood pressure, pulse, and                            oxygen saturations were monitored  continuously. The                            Olympus PCF-H190DL (#[id number]) Colonoscope was                            introduced through the anus and advanced to the the                            cecum, identified by the appendiceal orifice,                            ileocecal valve and palpation. The colonoscopy was                            technically difficult and complex due to inadequate                            bowel prep. Successful completion of the procedure                            was aided by lavage. The patient tolerated the                            procedure well. The quality of the bowel                            preparation was adequate to identify polyps greater                            than 5 mm in size. The ileocecal valve, appendiceal                            orifice, and rectum were photographed. Scope In: 10:26:37 AM Scope Out: 10:46:41 AM Scope Withdrawal Time: 0 hours 7 minutes 38 seconds  Total Procedure Duration: 0 hours 20 minutes 4 seconds  Findings:                 The perianal and digital rectal examinations were  normal.                           Two sessile polyps were found in the ascending                            colon. The polyps were 6 to 11 mm in size. These                            polyps were removed with a cold snare. Resection                            and retrieval were complete.                           Scattered small-mouthed diverticula were found in                            the sigmoid colon, descending colon, transverse                            colon and ascending colon.                           Non-bleeding external and internal hemorrhoids were                            found during retroflexion. The hemorrhoids were                            medium-sized. Complications:            No immediate complications. Estimated Blood Loss:     Estimated blood loss was minimal. Impression:                - Two 6 to 11 mm polyps in the ascending colon,                            removed with a cold snare. Resected and retrieved.                           - Diverticulosis in the sigmoid colon, in the                            descending colon, in the transverse colon and in                            the ascending colon.                           - Non-bleeding external and internal hemorrhoids. Recommendation:           - Patient has a contact number available for                            emergencies. The signs and symptoms of  potential                            delayed complications were discussed with the                            patient. Return to normal activities tomorrow.                            Written discharge instructions were provided to the                            patient.                           - Resume previous diet.                           - Continue present medications.                           - Await pathology results.                           - Repeat colonoscopy in 3 years for surveillance.                           - For future colonoscopy the patient will require                            an extended preparation. If there are any                            questions, please contact the gastroenterologist. Mauri Pole, MD 05/25/2022 10:52:52 AM This report has been signed electronically.

## 2022-05-25 NOTE — Progress Notes (Signed)
Pt's states no medical or surgical changes since previsit or office visit. 

## 2022-05-26 ENCOUNTER — Telehealth: Payer: Self-pay | Admitting: *Deleted

## 2022-05-26 NOTE — Telephone Encounter (Signed)
  Follow up Call-     05/25/2022    9:51 AM 05/24/2022    1:14 PM  Call back number  Post procedure Call Back phone  # [phone]  Permission to leave phone message Yes Yes     Patient questions:  Do you have a fever, pain , or abdominal swelling? No. Pain Score  0 *  Have you tolerated food without any problems? Yes.    Have you been able to return to your normal activities? Yes.    Do you have any questions about your discharge instructions: Diet   No. Medications  No. Follow up visit  No.  Do you have questions or concerns about your Care? No.  Actions: * If pain score is 4 or above: No action needed, pain <4.

## 2022-06-02 ENCOUNTER — Encounter: Payer: Self-pay | Admitting: Gastroenterology

## 2022-08-05 ENCOUNTER — Encounter: Payer: Self-pay | Admitting: Family Medicine

## 2022-08-05 DIAGNOSIS — Z720 Tobacco use: Secondary | ICD-10-CM

## 2022-08-05 MED ORDER — NICOTINE POLACRILEX 4 MG MT GUM: 4.0000 mg | CHEWING_GUM | OROMUCOSAL | 1 refills | Status: DC | PRN

## 2022-08-30 ENCOUNTER — Other Ambulatory Visit: Payer: Self-pay | Admitting: Family Medicine

## 2022-08-30 DIAGNOSIS — E876 Hypokalemia: Secondary | ICD-10-CM

## 2022-08-30 DIAGNOSIS — E785 Hyperlipidemia, unspecified: Secondary | ICD-10-CM

## 2022-08-30 DIAGNOSIS — E119 Type 2 diabetes mellitus without complications: Secondary | ICD-10-CM

## 2022-09-06 ENCOUNTER — Encounter: Payer: Self-pay | Admitting: General Practice

## 2022-10-12 ENCOUNTER — Ambulatory Visit: Payer: Commercial Managed Care - HMO | Admitting: Obstetrics & Gynecology

## 2022-10-12 ENCOUNTER — Encounter: Payer: Self-pay | Admitting: Obstetrics & Gynecology

## 2022-10-12 ENCOUNTER — Other Ambulatory Visit (HOSPITAL_COMMUNITY)
Admission: RE | Admit: 2022-10-12 | Discharge: 2022-10-12 | Disposition: A | Discharge status: Home / Self Care | Payer: Commercial Managed Care - HMO | Source: Ambulatory Visit | Attending: Obstetrics & Gynecology | Admitting: Obstetrics & Gynecology

## 2022-10-12 VITALS — BP 126/71 | HR 96 | Wt 179.0 lb

## 2022-10-12 DIAGNOSIS — N760 Acute vaginitis: Secondary | ICD-10-CM

## 2022-10-12 DIAGNOSIS — B379 Candidiasis, unspecified: Secondary | ICD-10-CM | POA: Diagnosis present

## 2022-10-12 MED ORDER — TERCONAZOLE 0.4 % VA CREA: 1.0000 | TOPICAL_CREAM | Freq: Every day | VAGINAL | 0 refills | Status: DC

## 2022-10-12 MED ORDER — FLUCONAZOLE 150 MG PO TABS
150.0000 mg | ORAL_TABLET | ORAL | 3 refills | Status: DC
Start: 2022-10-12 — End: 2022-12-26

## 2022-10-12 NOTE — Progress Notes (Signed)
History:  67 y.o. G4P3 here today for vulvar itching for 2 weeks. Used OTC antifungal with only minimal relief.     The following portions of the patient's history were reviewed and updated as appropriate: allergies, current medications, past family history, past medical history, past social history, past surgical history and problem list.  Review of Systems:  Pertinent items are noted in HPI.    Objective:  Physical Exam Blood pressure 126/71, pulse 96, weight 179 lb (81.2 kg).  CONSTITUTIONAL: Well-developed, well-nourished female in no acute distress.  HENT:  Normocephalic, atraumatic EYES: Conjunctivae and EOM are normal. No scleral icterus.  NECK: Normal range of motion SKIN: Skin is warm and dry. No rash noted. Not diaphoretic.No pallor. NEUROLGIC: Alert and oriented to person, place, and time. Normal coordination.  Pelvic: ext skin changes c/w yeast vaginitis.  There is some thick white discharge in the vaginal canal. No lesions noted.    Assessment & Plan:  Diagnoses and all orders for this visit:  Yeast infection -     Cervicovaginal ancillary only( [facility]) -     terconazole (TERAZOL 7) 0.4 % vaginal cream; Place 1 applicator vaginally at bedtime. Use for seven days -     fluconazole (DIFLUCAN) 150 MG tablet; Take 1 tablet (150 mg total) by mouth every 3 (three) days. For two doses  Vaginitis and vulvovaginitis -     terconazole (TERAZOL 7) 0.4 % vaginal cream; Place 1 applicator vaginally at bedtime. Use for seven days -     fluconazole (DIFLUCAN) 150 MG tablet; Take 1 tablet (150 mg total) by mouth every 3 (three) days. For two doses   F/u if sx persist despite treatment.    Jasyn Mey L. Harraway-Smith, M.D., [facility]

## 2022-10-14 LAB — CERVICOVAGINAL ANCILLARY ONLY
Candida Glabrata: NEGATIVE
Candida Vaginitis: POSITIVE — AB
Comment: NEGATIVE
Comment: NEGATIVE

## 2022-10-18 ENCOUNTER — Encounter: Payer: Self-pay | Admitting: Obstetrics & Gynecology

## 2022-10-26 ENCOUNTER — Encounter: Payer: Self-pay | Admitting: Obstetrics & Gynecology

## 2022-11-28 ENCOUNTER — Other Ambulatory Visit: Payer: Self-pay | Admitting: Family Medicine

## 2022-11-28 DIAGNOSIS — E119 Type 2 diabetes mellitus without complications: Secondary | ICD-10-CM

## 2022-12-07 ENCOUNTER — Encounter: Payer: Self-pay | Admitting: Family Medicine

## 2022-12-08 ENCOUNTER — Ambulatory Visit (INDEPENDENT_AMBULATORY_CARE_PROVIDER_SITE_OTHER): Payer: Commercial Managed Care - HMO | Admitting: Family Medicine

## 2022-12-08 ENCOUNTER — Encounter: Payer: Self-pay | Admitting: Family Medicine

## 2022-12-08 VITALS — BP 118/62 | HR 91 | Temp 97.9°F | Resp 18 | Ht 62.0 in | Wt 168.2 lb

## 2022-12-08 DIAGNOSIS — E119 Type 2 diabetes mellitus without complications: Secondary | ICD-10-CM

## 2022-12-08 DIAGNOSIS — I10 Essential (primary) hypertension: Secondary | ICD-10-CM | POA: Diagnosis not present

## 2022-12-08 DIAGNOSIS — R634 Abnormal weight loss: Secondary | ICD-10-CM

## 2022-12-08 DIAGNOSIS — E7800 Pure hypercholesterolemia, unspecified: Secondary | ICD-10-CM

## 2022-12-08 LAB — COMPREHENSIVE METABOLIC PANEL
ALT: 11 U/L (ref 0–35)
AST: 12 U/L (ref 0–37)
Albumin: 3.8 g/dL (ref 3.5–5.2)
Alkaline Phosphatase: 106 U/L (ref 39–117)
BUN: 14 mg/dL (ref 6–23)
CO2: 29 mEq/L (ref 19–32)
Calcium: 9.7 mg/dL (ref 8.4–10.5)
Chloride: 95 mEq/L — ABNORMAL LOW (ref 96–112)
Creatinine, Ser: 0.78 mg/dL (ref 0.40–1.20)
GFR: 79.97 mL/min (ref >60.00)
Glucose, Bld: 423 mg/dL — ABNORMAL HIGH (ref 70–99)
Potassium: 4.2 mEq/L (ref 3.5–5.1)
Sodium: 132 mEq/L — ABNORMAL LOW (ref 135–145)
Total Bilirubin: 0.6 mg/dL (ref 0.2–1.2)
Total Protein: 7.3 g/dL (ref 6.0–8.3)

## 2022-12-08 LAB — LIPID PANEL
Cholesterol: 242 mg/dL — ABNORMAL HIGH (ref 0–200)
HDL: 55.5 mg/dL (ref >39.00)
NonHDL: 186.72
Total CHOL/HDL Ratio: 4
Triglycerides: 283 mg/dL — ABNORMAL HIGH (ref 0.0–149.0)
VLDL: 56.6 mg/dL — ABNORMAL HIGH (ref 0.0–40.0)

## 2022-12-08 LAB — CBC
HCT: 44.6 % (ref 36.0–46.0)
Hemoglobin: 14.8 g/dL (ref 12.0–15.0)
MCHC: 33.2 g/dL (ref 30.0–36.0)
MCV: 90.8 fl (ref 78.0–100.0)
Platelets: 338 10*3/uL (ref 150.0–400.0)
RBC: 4.91 Mil/uL (ref 3.87–5.11)
RDW: 14.3 % (ref 11.5–15.5)
WBC: 9.9 10*3/uL (ref 4.0–10.5)

## 2022-12-08 LAB — TSH: TSH: 1.04 u[IU]/mL (ref 0.35–5.50)

## 2022-12-08 LAB — LDL CHOLESTEROL, DIRECT: Direct LDL: 162 mg/dL

## 2022-12-08 LAB — HEMOGLOBIN A1C: Hgb A1c MFr Bld: 13.3 % — ABNORMAL HIGH (ref 4.6–6.5)

## 2022-12-08 NOTE — Patient Instructions (Signed)
It was good to see you today- I suspect your weight loss is due to changing your diet, but we will certainly get labs and make sure all looks ok Assuming your labs are ok, we can do a CT of your chest/ abdomen and pelvis if you are still concerned!
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
## 2022-12-10 ENCOUNTER — Encounter: Payer: Self-pay | Admitting: Family Medicine

## 2022-12-10 DIAGNOSIS — E119 Type 2 diabetes mellitus without complications: Secondary | ICD-10-CM

## 2022-12-10 MED ORDER — PEN NEEDLES 31G X 5 MM MISC
1.0000 | Freq: Every day | 99 refills | Status: AC
Start: 2022-12-10 — End: ?

## 2022-12-10 MED ORDER — BLOOD GLUCOSE MONITORING SUPPL DEVI
1.0000 | Freq: Three times a day (TID) | 0 refills | Status: AC
Start: 2022-12-10 — End: ?

## 2022-12-10 MED ORDER — BLOOD GLUCOSE TEST VI STRP
1.0000 | ORAL_STRIP | Freq: Three times a day (TID) | 0 refills | Status: DC
Start: 2022-12-10 — End: 2023-01-16

## 2022-12-10 MED ORDER — BASAGLAR KWIKPEN 100 UNIT/ML ~~LOC~~ SOPN
10.0000 [IU] | PEN_INJECTOR | Freq: Every day | SUBCUTANEOUS | 1 refills | Status: DC
Start: 2022-12-10 — End: 2023-05-01

## 2022-12-10 MED ORDER — LANCET DEVICE MISC
1.0000 | Freq: Three times a day (TID) | 0 refills | Status: AC
Start: 2022-12-10 — End: 2023-01-09

## 2022-12-10 NOTE — Addendum Note (Signed)
Addended by: Abbe Amsterdam C on: 12/10/2022 01:48 PM   Modules accepted: Orders

## 2022-12-19 NOTE — Addendum Note (Signed)
Addended by: Abbe Amsterdam C on: 12/19/2022 12:28 PM   Modules accepted: Orders

## 2022-12-20 ENCOUNTER — Telehealth: Payer: Self-pay | Admitting: Pharmacist

## 2022-12-20 MED ORDER — FREESTYLE LIBRE 3 SENSOR MISC: 5 refills | Status: AC

## 2022-12-20 NOTE — Telephone Encounter (Signed)
Received message in Epic from provider requesting assistance with Continuous Glucose Monitor for patient.  Called Mrs. Graffeo and set up appointment for 12/26/2022 at 4pm.  Will send in Rx for Freestyle LIbre 3 - might need prior authorization with her insurance.

## 2022-12-26 ENCOUNTER — Ambulatory Visit (INDEPENDENT_AMBULATORY_CARE_PROVIDER_SITE_OTHER): Payer: Commercial Managed Care - HMO | Admitting: Pharmacist

## 2022-12-26 DIAGNOSIS — E119 Type 2 diabetes mellitus without complications: Secondary | ICD-10-CM | POA: Diagnosis not present

## 2022-12-26 DIAGNOSIS — E7800 Pure hypercholesterolemia, unspecified: Secondary | ICD-10-CM

## 2022-12-26 DIAGNOSIS — I10 Essential (primary) hypertension: Secondary | ICD-10-CM

## 2022-12-26 NOTE — Progress Notes (Unsigned)
12/26/2022 Name: Misty Bell MRN: [id number] DOB: 1956/06/01  No chief complaint on file.   Misty Bell is a 67 y.o. year old female who was referred for medication management by their primary care provider, Copland, Gwenlyn Found, MD. They presented for a face to face visit today.   They were referred to the pharmacist by {referredtopharmacy:27270} for assistance in managing {referralreason:27271}    Subjective:  Care Team: Primary Care Provider: Pearline Cables, MD ; Next Scheduled Visit: *** {careteamprovider:27366}  Medication Access/Adherence  Current Pharmacy:  [facility] [phone] - [place], [place] - [address] [facility] [address] [place] [address] Phone: [phone] Fax: [phone]  [facility] - [facility] [address] [place] [address] Phone: [phone] Fax: [phone]  [id number] - [place], [place] - [address] [place] [address] Phone: [phone] Fax: [phone]   Patient reports affordability concerns with their medications: {YES/NO:21197} Patient reports access/transportation concerns to their pharmacy: {YES/NO:21197} Patient reports adherence concerns with their medications:  {YES/NO:21197} ***   {Pharmacy S/O Choices:26420}   Objective:  Lab Results  Component Value Date   HGBA1C 13.3 (H) 12/08/2022    Lab Results  Component Value Date   CREATININE 0.78 12/08/2022   BUN 14 12/08/2022   NA 132 (L) 12/08/2022   K 4.2 12/08/2022   CL 95 (L) 12/08/2022   CO2 29 12/08/2022    Lab Results  Component Value Date   CHOL 242 (H) 12/08/2022   HDL 55.50 12/08/2022   LDLCALC 191 (H) 12/12/2019   LDLDIRECT 162.0 12/08/2022   TRIG 283.0 (H) 12/08/2022   CHOLHDL 4 12/08/2022    Medications Reviewed Today     Reviewed by Henrene Pastor, RPH-CPP (Pharmacist) on 12/20/22 at  0920  Med List Status: <None>   Medication Order Taking? Sig Documenting Provider Last Dose Status Informant  amLODipine (NORVASC) 10 MG tablet [id number] No TAKE 1 TABLET(10 MG) BY MOUTH DAILY Copland, Gwenlyn Found, MD Taking Active   Blood Glucose Monitoring Suppl DEVI [id number]  1 each by Does not apply route in the morning, at noon, and at bedtime. May substitute to any manufacturer covered by patient's insurance. Copland, Gwenlyn Found, MD  Active   calcium citrate-vitamin D (CITRACAL+D) 315-200 MG-UNIT tablet [id number] No Take 1 tablet by mouth 2 (two) times daily. [provider] Taking Active Self  fluconazole (DIFLUCAN) 150 MG tablet [id number] No Take 1 tablet (150 mg total) by mouth every 3 (three) days. For two doses Willodean Rosenthal, MD Taking Active   furosemide (LASIX) 20 MG tablet [id number] No Take 1 tablet (20 mg total) by mouth daily. Copland, Gwenlyn Found, MD Taking Active   Glucose Blood (BLOOD GLUCOSE TEST STRIPS) STRP [id number]  1 each by In Vitro route in the morning, at noon, and at bedtime. May substitute to any manufacturer covered by patient's insurance. Copland, Gwenlyn Found, MD  Active   Insulin Glargine (BASAGLAR KWIKPEN) 100 UNIT/ML [id number]  Inject 10 Units into the skin daily. Increase dose gradually as instructed by MD Copland, Gwenlyn Found, MD  Active   Insulin Pen Needle (PEN NEEDLES) 31G X 5 MM MISC [id number]  1 each by Does not apply route daily. Copland, Gwenlyn Found, MD  Active   Lancet Device MISC [id number]  1 each by Does not apply route in the morning, at noon, and at bedtime. May substitute  to any manufacturer covered by patient's insurance. Copland, Gwenlyn Found, MD  Active   lovastatin (MEVACOR) 20 MG tablet [id number] No TAKE 1 TABLET BY MOUTH EVERY DAY AT BEDTIME Copland, Gwenlyn Found, MD Taking Active   metFORMIN (GLUCOPHAGE) 500 MG tablet [id number] No TAKE 1 TABLET BY MOUTH EVERY DAY WITH BREAKFAST Copland, Gwenlyn Found, MD Taking Active   Multiple Vitamins-Minerals  (CELEBRATE MULTI-COMPLETE 18) CHEW [id number] No Chew 1 tablet by mouth daily. [provider] Taking Active   nicotine polacrilex (NICORETTE) 4 MG gum [id number] No Take 1 each (4 mg total) by mouth as needed for smoking cessation. Copland, Gwenlyn Found, MD Taking Active   potassium chloride SA (KLOR-CON M) 20 MEQ tablet [id number] No TAKE 1 TABLET BY MOUTH DAILY USE FOR 3 DAYS THEN HOLD FOR LATER USE Copland, Gwenlyn Found, MD Taking Active   sertraline (ZOLOFT) 100 MG tablet [id number] No TAKE 1 TABLET BY MOUTH EVERY DAY Copland, Gwenlyn Found, MD Taking Active   terconazole (TERAZOL 7) 0.4 % vaginal cream [id number] No Place 1 applicator vaginally at bedtime. Use for seven days Willodean Rosenthal, MD Taking Active               Assessment/Plan:   {Pharmacy A/P Choices:26421}  Follow Up Plan: ***  ***

## 2022-12-30 NOTE — Addendum Note (Signed)
Addended by: Henrene Pastor B on: 12/30/2022 03:33 PM   Modules accepted: Level of Service

## 2023-01-15 ENCOUNTER — Other Ambulatory Visit: Payer: Self-pay | Admitting: Family Medicine

## 2023-01-15 DIAGNOSIS — E119 Type 2 diabetes mellitus without complications: Secondary | ICD-10-CM

## 2023-01-24 ENCOUNTER — Encounter: Payer: Self-pay | Admitting: Family Medicine

## 2023-01-24 ENCOUNTER — Telehealth: Payer: Self-pay

## 2023-01-24 DIAGNOSIS — E119 Type 2 diabetes mellitus without complications: Secondary | ICD-10-CM

## 2023-01-24 DIAGNOSIS — E1165 Type 2 diabetes mellitus with hyperglycemia: Secondary | ICD-10-CM

## 2023-01-24 MED ORDER — DEXCOM G7 SENSOR MISC
11 refills | Status: DC
Start: 2023-01-24 — End: 2023-02-03

## 2023-01-24 MED ORDER — TRULICITY 0.75 MG/0.5ML ~~LOC~~ SOAJ
0.7500 mg | SUBCUTANEOUS | 1 refills | Status: DC
Start: 2023-01-24 — End: 2023-01-31

## 2023-01-24 MED ORDER — DEXCOM G7 RECEIVER DEVI
99 refills | Status: AC
Start: 2023-01-24 — End: ?

## 2023-01-24 NOTE — Telephone Encounter (Signed)
PA approved.   CaseId:[id number];Status:Approved;Review Type:Prior Auth;Coverage Start Date:01/24/2023;Coverage End Date:01/24/2024;

## 2023-01-24 NOTE — Addendum Note (Signed)
Addended by: Abbe Amsterdam C on: 01/24/2023 05:01 PM   Modules accepted: Orders

## 2023-01-24 NOTE — Telephone Encounter (Signed)
PA initiated via Covermymeds; KEY: [id number]. Awaiting determination.

## 2023-01-25 ENCOUNTER — Telehealth: Payer: Self-pay

## 2023-01-25 NOTE — Telephone Encounter (Signed)
PA initiated via Covermymeds; KEY: [id number]  PA approved.   [id number];Review Type:Prior Auth;Coverage Start Date:01/25/2023;Coverage End Date:01/25/2024;

## 2023-01-31 MED ORDER — TIRZEPATIDE 2.5 MG/0.5ML ~~LOC~~ SOAJ
2.5000 mg | SUBCUTANEOUS | 1 refills | Status: DC
Start: 2023-01-31 — End: 2023-03-08

## 2023-01-31 NOTE — Addendum Note (Signed)
Addended by: Abbe Amsterdam C on: 01/31/2023 02:45 PM   Modules accepted: Orders

## 2023-02-03 MED ORDER — FREESTYLE LIBRE 3 SENSOR MISC: 1.0000 | 3 refills | Status: AC

## 2023-02-03 NOTE — Addendum Note (Signed)
Addended by: Abbe Amsterdam C on: 02/03/2023 08:05 PM   Modules accepted: Orders

## 2023-02-20 ENCOUNTER — Other Ambulatory Visit: Payer: Self-pay | Admitting: Family Medicine

## 2023-02-20 DIAGNOSIS — E119 Type 2 diabetes mellitus without complications: Secondary | ICD-10-CM

## 2023-02-26 ENCOUNTER — Other Ambulatory Visit: Payer: Self-pay | Admitting: Family Medicine

## 2023-02-26 DIAGNOSIS — E119 Type 2 diabetes mellitus without complications: Secondary | ICD-10-CM

## 2023-03-07 ENCOUNTER — Encounter: Payer: Self-pay | Admitting: Family Medicine

## 2023-03-08 MED ORDER — TIRZEPATIDE 5 MG/0.5ML ~~LOC~~ SOAJ: 5.0000 mg | SUBCUTANEOUS | 2 refills | Status: DC

## 2023-04-06 NOTE — Patient Instructions (Addendum)
It was good to see you today, I will be in touch with your labs as soon as possible  Flu and pneumonia given here - recommend the Shingrix series and a covid booster this fall at your convenience We will increase mounjaro to 7.5; in another month we can go to 10 if you like I will get your labs back and let you know about the insulin- I am hoping we can decrease or stop this soon!
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
## 2023-04-10 ENCOUNTER — Encounter: Payer: Self-pay | Admitting: Family Medicine

## 2023-04-10 ENCOUNTER — Other Ambulatory Visit (HOSPITAL_BASED_OUTPATIENT_CLINIC_OR_DEPARTMENT_OTHER): Payer: Self-pay

## 2023-04-10 ENCOUNTER — Ambulatory Visit (INDEPENDENT_AMBULATORY_CARE_PROVIDER_SITE_OTHER): Payer: Managed Care, Other (non HMO) | Admitting: Family Medicine

## 2023-04-10 VITALS — BP 122/84 | HR 88 | Resp 18 | Ht 62.0 in | Wt 174.9 lb

## 2023-04-10 DIAGNOSIS — Z23 Encounter for immunization: Secondary | ICD-10-CM | POA: Diagnosis not present

## 2023-04-10 DIAGNOSIS — E1165 Type 2 diabetes mellitus with hyperglycemia: Secondary | ICD-10-CM

## 2023-04-10 DIAGNOSIS — E119 Type 2 diabetes mellitus without complications: Secondary | ICD-10-CM

## 2023-04-10 DIAGNOSIS — E7800 Pure hypercholesterolemia, unspecified: Secondary | ICD-10-CM

## 2023-04-10 DIAGNOSIS — Z1231 Encounter for screening mammogram for malignant neoplasm of breast: Secondary | ICD-10-CM

## 2023-04-10 DIAGNOSIS — I10 Essential (primary) hypertension: Secondary | ICD-10-CM

## 2023-04-10 DIAGNOSIS — Z0000 Encounter for general adult medical examination without abnormal findings: Secondary | ICD-10-CM

## 2023-04-10 DIAGNOSIS — Z7984 Long term (current) use of oral hypoglycemic drugs: Secondary | ICD-10-CM

## 2023-04-10 DIAGNOSIS — E2839 Other primary ovarian failure: Secondary | ICD-10-CM

## 2023-04-10 DIAGNOSIS — E785 Hyperlipidemia, unspecified: Secondary | ICD-10-CM

## 2023-04-10 LAB — BASIC METABOLIC PANEL
BUN: 10 mg/dL (ref 6–23)
CO2: 31 meq/L (ref 19–32)
Calcium: 9.8 mg/dL (ref 8.4–10.5)
Chloride: 102 meq/L (ref 96–112)
Creatinine, Ser: 0.72 mg/dL (ref 0.40–1.20)
GFR: 87.83 mL/min (ref >60.00)
Glucose, Bld: 95 mg/dL (ref 70–99)
Potassium: 3.7 meq/L (ref 3.5–5.1)
Sodium: 140 meq/L (ref 135–145)

## 2023-04-10 LAB — MICROALBUMIN / CREATININE URINE RATIO
Creatinine,U: 108.6 mg/dL
Microalb Creat Ratio: 0.6 mg/g (ref 0.0–30.0)
Microalb, Ur: <0.7 mg/dL (ref 0.0–1.9)

## 2023-04-10 LAB — HEMOGLOBIN A1C: Hgb A1c MFr Bld: 6.2 % (ref 4.6–6.5)

## 2023-04-10 MED ORDER — METFORMIN HCL 500 MG PO TABS
ORAL_TABLET | ORAL | 3 refills | Status: DC
  Filled 2023-04-10: qty 90, 90d supply, fill #0

## 2023-04-10 MED ORDER — COVID-19 MRNA VAC-TRIS(PFIZER) 30 MCG/0.3ML IM SUSY
0.3000 mL | PREFILLED_SYRINGE | Freq: Once | INTRAMUSCULAR | 0 refills | Status: AC
  Filled 2023-04-10: qty 0.3, 1d supply, fill #0

## 2023-04-10 MED ORDER — LOVASTATIN 20 MG PO TABS
ORAL_TABLET | ORAL | 3 refills | Status: DC
  Filled 2023-04-10: qty 90, 90d supply, fill #0

## 2023-04-10 MED ORDER — TIRZEPATIDE 7.5 MG/0.5ML ~~LOC~~ SOAJ
7.5000 mg | SUBCUTANEOUS | 2 refills | Status: DC
Start: 2023-04-10 — End: 2023-07-06
  Filled 2023-04-10 (×2): qty 2, 28d supply, fill #0
  Filled 2023-05-04: qty 2, 28d supply, fill #1
  Filled 2023-06-01 – 2023-07-06 (×3): qty 2, 28d supply, fill #2

## 2023-04-17 ENCOUNTER — Ambulatory Visit (HOSPITAL_BASED_OUTPATIENT_CLINIC_OR_DEPARTMENT_OTHER)
Admission: RE | Admit: 2023-04-17 | Discharge: 2023-04-17 | Disposition: A | Discharge status: Home / Self Care | Payer: Managed Care, Other (non HMO) | Source: Ambulatory Visit | Attending: Family Medicine | Admitting: Family Medicine

## 2023-04-17 ENCOUNTER — Encounter (HOSPITAL_BASED_OUTPATIENT_CLINIC_OR_DEPARTMENT_OTHER): Payer: Self-pay

## 2023-04-17 ENCOUNTER — Encounter: Payer: Self-pay | Admitting: Family Medicine

## 2023-04-17 DIAGNOSIS — M8580 Other specified disorders of bone density and structure, unspecified site: Secondary | ICD-10-CM

## 2023-04-17 DIAGNOSIS — Z1231 Encounter for screening mammogram for malignant neoplasm of breast: Secondary | ICD-10-CM | POA: Diagnosis present

## 2023-04-17 DIAGNOSIS — E2839 Other primary ovarian failure: Secondary | ICD-10-CM | POA: Insufficient documentation

## 2023-04-17 DIAGNOSIS — M8589 Other specified disorders of bone density and structure, multiple sites: Secondary | ICD-10-CM | POA: Diagnosis not present

## 2023-04-17 HISTORY — DX: Other specified disorders of bone density and structure, unspecified site: M85.80

## 2023-05-01 ENCOUNTER — Other Ambulatory Visit: Payer: Self-pay | Admitting: Family Medicine

## 2023-05-01 DIAGNOSIS — E119 Type 2 diabetes mellitus without complications: Secondary | ICD-10-CM

## 2023-05-02 ENCOUNTER — Telehealth: Payer: Self-pay

## 2023-05-02 NOTE — Telephone Encounter (Signed)
Received PA request via fax form pharmacy for Basaglar 100 U/ML Kiwipen injection 3 ml

## 2023-05-04 ENCOUNTER — Other Ambulatory Visit (HOSPITAL_COMMUNITY): Payer: Self-pay

## 2023-05-04 ENCOUNTER — Other Ambulatory Visit (HOSPITAL_BASED_OUTPATIENT_CLINIC_OR_DEPARTMENT_OTHER): Payer: Self-pay

## 2023-05-08 ENCOUNTER — Other Ambulatory Visit: Payer: Self-pay

## 2023-05-08 ENCOUNTER — Other Ambulatory Visit (HOSPITAL_COMMUNITY): Payer: Self-pay

## 2023-05-08 ENCOUNTER — Encounter: Payer: Self-pay | Admitting: Pharmacist

## 2023-05-11 ENCOUNTER — Other Ambulatory Visit (HOSPITAL_COMMUNITY): Payer: Self-pay

## 2023-05-11 ENCOUNTER — Encounter: Payer: Self-pay | Admitting: Family Medicine

## 2023-05-11 NOTE — Telephone Encounter (Signed)
Okay for change? °

## 2023-05-11 NOTE — Telephone Encounter (Signed)
Per test claim, lantus solostar is preferred, please change if clinically appropriate or advise, thanks

## 2023-05-11 NOTE — Telephone Encounter (Signed)
I sent a message to patient to clarify her current insulin use

## 2023-05-25 ENCOUNTER — Other Ambulatory Visit: Payer: Self-pay | Admitting: Family Medicine

## 2023-05-25 DIAGNOSIS — E119 Type 2 diabetes mellitus without complications: Secondary | ICD-10-CM

## 2023-05-27 ENCOUNTER — Other Ambulatory Visit: Payer: Self-pay | Admitting: Family Medicine

## 2023-05-27 DIAGNOSIS — E119 Type 2 diabetes mellitus without complications: Secondary | ICD-10-CM

## 2023-05-27 DIAGNOSIS — E785 Hyperlipidemia, unspecified: Secondary | ICD-10-CM

## 2023-05-30 ENCOUNTER — Other Ambulatory Visit: Payer: Self-pay | Admitting: Family Medicine

## 2023-05-30 NOTE — Progress Notes (Signed)
Called pt as she did not read her mychart message regarding her insulin She is using mounjaro 7.5 and notes her glucose is under good control. She notes her glucometer states her A1c is running 5.8 5.9%.  She has been using insulin some of the time, 10 units daily because she feels like it makes her vision better.  However, I advised her with the A1c under 6 she likely does not need insulin and she may be running the risk of hypoglycemia.  Will have her stop insulin, continue current dose of Mounjaro.  I scheduled her an appointment to see me in February.  She would let me know if any concerns in the meantime

## 2023-06-01 ENCOUNTER — Other Ambulatory Visit (HOSPITAL_COMMUNITY): Payer: Self-pay

## 2023-06-01 ENCOUNTER — Other Ambulatory Visit: Payer: Self-pay

## 2023-07-06 ENCOUNTER — Encounter: Payer: Self-pay | Admitting: Family Medicine

## 2023-07-06 DIAGNOSIS — E1165 Type 2 diabetes mellitus with hyperglycemia: Secondary | ICD-10-CM

## 2023-07-06 MED ORDER — TIRZEPATIDE 10 MG/0.5ML ~~LOC~~ SOAJ: 10.0000 mg | SUBCUTANEOUS | 2 refills | Status: DC

## 2023-07-07 ENCOUNTER — Other Ambulatory Visit (HOSPITAL_COMMUNITY): Payer: Self-pay

## 2023-07-07 ENCOUNTER — Other Ambulatory Visit (HOSPITAL_BASED_OUTPATIENT_CLINIC_OR_DEPARTMENT_OTHER): Payer: Self-pay

## 2023-07-07 ENCOUNTER — Other Ambulatory Visit: Payer: Self-pay

## 2023-07-11 ENCOUNTER — Other Ambulatory Visit (HOSPITAL_COMMUNITY): Payer: Self-pay
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
## 2023-08-09 ENCOUNTER — Other Ambulatory Visit (HOSPITAL_COMMUNITY)
Admission: RE | Admit: 2023-08-09 | Discharge: 2023-08-09 | Disposition: A | Discharge status: Home / Self Care | Payer: Medicare (Managed Care) | Source: Ambulatory Visit | Attending: Family Medicine | Admitting: Family Medicine

## 2023-08-09 ENCOUNTER — Ambulatory Visit: Payer: Managed Care, Other (non HMO) | Admitting: Family Medicine

## 2023-08-09 ENCOUNTER — Ambulatory Visit: Payer: Medicare (Managed Care) | Admitting: Family Medicine

## 2023-08-09 VITALS — BP 122/72 | HR 94 | Temp 97.6°F | Resp 18 | Ht 62.0 in | Wt 156.0 lb

## 2023-08-09 DIAGNOSIS — Z1151 Encounter for screening for human papillomavirus (HPV): Secondary | ICD-10-CM | POA: Diagnosis not present

## 2023-08-09 DIAGNOSIS — R87610 Atypical squamous cells of undetermined significance on cytologic smear of cervix (ASC-US): Secondary | ICD-10-CM | POA: Diagnosis not present

## 2023-08-09 DIAGNOSIS — E7800 Pure hypercholesterolemia, unspecified: Secondary | ICD-10-CM

## 2023-08-09 DIAGNOSIS — Z124 Encounter for screening for malignant neoplasm of cervix: Secondary | ICD-10-CM | POA: Insufficient documentation

## 2023-08-09 DIAGNOSIS — E1165 Type 2 diabetes mellitus with hyperglycemia: Secondary | ICD-10-CM

## 2023-08-09 DIAGNOSIS — Z1329 Encounter for screening for other suspected endocrine disorder: Secondary | ICD-10-CM

## 2023-08-09 DIAGNOSIS — I10 Essential (primary) hypertension: Secondary | ICD-10-CM | POA: Diagnosis not present

## 2023-08-09 DIAGNOSIS — Z7985 Long-term (current) use of injectable non-insulin antidiabetic drugs: Secondary | ICD-10-CM

## 2023-08-09 LAB — COMPREHENSIVE METABOLIC PANEL
ALT: 10 U/L (ref 0–35)
AST: 14 U/L (ref 0–37)
Albumin: 4.3 g/dL (ref 3.5–5.2)
Alkaline Phosphatase: 66 U/L (ref 39–117)
BUN: 15 mg/dL (ref 6–23)
CO2: 34 meq/L — ABNORMAL HIGH (ref 19–32)
Calcium: 9.6 mg/dL (ref 8.4–10.5)
Chloride: 99 meq/L (ref 96–112)
Creatinine, Ser: 0.71 mg/dL (ref 0.40–1.20)
GFR: 89.11 mL/min (ref >60.00)
Glucose, Bld: 82 mg/dL (ref 70–99)
Potassium: 3.6 meq/L (ref 3.5–5.1)
Sodium: 141 meq/L (ref 135–145)
Total Bilirubin: 0.7 mg/dL (ref 0.2–1.2)
Total Protein: 7.8 g/dL (ref 6.0–8.3)

## 2023-08-09 LAB — CBC
HCT: 44.7 % (ref 36.0–46.0)
Hemoglobin: 14.9 g/dL (ref 12.0–15.0)
MCHC: 33.4 g/dL (ref 30.0–36.0)
MCV: 91.3 fL (ref 78.0–100.0)
Platelets: 365 10*3/uL (ref 150.0–400.0)
RBC: 4.9 Mil/uL (ref 3.87–5.11)
RDW: 14.1 % (ref 11.5–15.5)
WBC: 7.7 10*3/uL (ref 4.0–10.5)

## 2023-08-09 LAB — TSH: TSH: 1.42 u[IU]/mL (ref 0.35–5.50)

## 2023-08-09 LAB — HEMOGLOBIN A1C: Hgb A1c MFr Bld: 5.8 % (ref 4.6–6.5)

## 2023-08-09 MED ORDER — TIRZEPATIDE 7.5 MG/0.5ML ~~LOC~~ SOAJ: 7.5000 mg | SUBCUTANEOUS | 1 refills | Status: DC
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
## 2023-08-09 NOTE — Patient Instructions (Addendum)
It was good to see you today- I will be in touch with your labs and your pap asap We will decrease Mounjaro to 7.5 mg; can stay at that dose or continue going down per your preferance  Ok to try cutting your amlodipine in half- 5 mg- this may be enough to control your BP  Please see me in 4-6 months assuming all is well!    Can get RSV vaccine Also recommend shingrix if not done yet!

## 2023-08-10 ENCOUNTER — Encounter: Payer: Self-pay | Admitting: Family Medicine

## 2023-08-14 LAB — CYTOLOGY - PAP
Comment: NEGATIVE
High risk HPV: NEGATIVE

## 2023-08-15 ENCOUNTER — Other Ambulatory Visit: Payer: Self-pay | Admitting: Family Medicine

## 2023-08-15 ENCOUNTER — Encounter: Payer: Self-pay | Admitting: Family Medicine

## 2023-08-15 DIAGNOSIS — R87612 Low grade squamous intraepithelial lesion on cytologic smear of cervix (LGSIL): Secondary | ICD-10-CM

## 2023-08-29 ENCOUNTER — Other Ambulatory Visit: Payer: Self-pay | Admitting: Family Medicine

## 2023-08-29 DIAGNOSIS — R600 Localized edema: Secondary | ICD-10-CM

## 2023-08-30 ENCOUNTER — Other Ambulatory Visit: Payer: Self-pay | Admitting: Family Medicine

## 2023-08-30 DIAGNOSIS — R87612 Low grade squamous intraepithelial lesion on cytologic smear of cervix (LGSIL): Secondary | ICD-10-CM

## 2023-08-30 DIAGNOSIS — Z8249 Family history of ischemic heart disease and other diseases of the circulatory system: Secondary | ICD-10-CM

## 2023-08-30 NOTE — Progress Notes (Signed)
 Called patient as she had not yet seen her message about her Pap smear.  She would like to see gynecology for follow-up.  She also shares with me that her 66 year old daughter presented at the ER last week with chest pain, she was found to have severe heart disease and actually died.  I shared our deepest condolences with [place].  She would like to see cardiology to have her own heart checked out-I placed a referral

## 2023-09-27 ENCOUNTER — Ambulatory Visit: Payer: Medicare (Managed Care) | Admitting: Obstetrics & Gynecology

## 2023-09-27 VITALS — BP 120/78 | HR 85 | Wt 150.0 lb

## 2023-09-27 DIAGNOSIS — R87612 Low grade squamous intraepithelial lesion on cytologic smear of cervix (LGSIL): Secondary | ICD-10-CM | POA: Diagnosis not present

## 2023-09-27 NOTE — Progress Notes (Signed)
 History:  68 y.o. G4P3 here today for f/u of abnormal PAP   The following portions of the patient's history were reviewed and updated as appropriate: allergies, current medications, past family history, past medical history, past social history, past surgical history and problem list.  Review of Systems:  Pertinent items are noted in HPI.    Objective:  Physical Exam Blood pressure 120/78, pulse 85, weight 150 lb (68 kg).  CONSTITUTIONAL: Well-developed, well-nourished female in no acute distress.  HENT:  Normocephalic, atraumatic EYES: Conjunctivae and EOM are normal. No scleral icterus.  NECK: Normal range of motion SKIN: Skin is warm and dry. No rash noted. Not diaphoretic.No pallor. NEUROLGIC: Alert and oriented to person, place, and time. Normal coordination.   Labs and Imaging 08/09/2023 HIGH RISK HPV ([place]): Negative ADEQUACY: Satisfactory for evaluation; transformation zone component PRESENT. 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 Clinical COMMENT - CYTOLOGY: correlation is recommended. COMMENT (MOLECULAR): Normal Reference Range HPV - Negative  Assessment & Plan:  LGSIL with neg hrHPV Pt needs colpo. Will schedule.  Misty Finnigan L. Bell, M.D., FACOG

## 2023-10-12 ENCOUNTER — Encounter: Payer: Medicare (Managed Care) | Admitting: Family Medicine

## 2023-10-18 ENCOUNTER — Ambulatory Visit: Payer: Medicare (Managed Care) | Attending: Cardiology | Admitting: Cardiology

## 2023-10-18 ENCOUNTER — Encounter: Payer: Self-pay | Admitting: Obstetrics & Gynecology

## 2023-10-18 ENCOUNTER — Encounter: Payer: Self-pay | Admitting: Cardiology

## 2023-10-18 ENCOUNTER — Ambulatory Visit: Payer: Medicare (Managed Care) | Admitting: Obstetrics & Gynecology

## 2023-10-18 ENCOUNTER — Other Ambulatory Visit (HOSPITAL_COMMUNITY)
Admission: RE | Admit: 2023-10-18 | Discharge: 2023-10-18 | Disposition: A | Discharge status: Home / Self Care | Payer: Medicare (Managed Care) | Source: Ambulatory Visit | Attending: Obstetrics & Gynecology | Admitting: Obstetrics & Gynecology

## 2023-10-18 VITALS — BP 104/68 | HR 97 | Ht 62.0 in | Wt 148.1 lb

## 2023-10-18 VITALS — BP 128/80 | HR 101 | Ht 62.0 in | Wt 148.0 lb

## 2023-10-18 DIAGNOSIS — E1169 Type 2 diabetes mellitus with other specified complication: Secondary | ICD-10-CM

## 2023-10-18 DIAGNOSIS — R87612 Low grade squamous intraepithelial lesion on cytologic smear of cervix (LGSIL): Secondary | ICD-10-CM

## 2023-10-18 DIAGNOSIS — R079 Chest pain, unspecified: Secondary | ICD-10-CM | POA: Diagnosis not present

## 2023-10-18 DIAGNOSIS — E669 Obesity, unspecified: Secondary | ICD-10-CM | POA: Diagnosis not present

## 2023-10-18 DIAGNOSIS — R0609 Other forms of dyspnea: Secondary | ICD-10-CM

## 2023-10-18 DIAGNOSIS — I10 Essential (primary) hypertension: Secondary | ICD-10-CM

## 2023-10-18 DIAGNOSIS — E7800 Pure hypercholesterolemia, unspecified: Secondary | ICD-10-CM

## 2023-10-18 DIAGNOSIS — Z8249 Family history of ischemic heart disease and other diseases of the circulatory system: Secondary | ICD-10-CM

## 2023-10-18 HISTORY — DX: Family history of ischemic heart disease and other diseases of the circulatory system: Z82.49

## 2023-10-18 MED ORDER — ASPIRIN 81 MG PO TBEC: 81.0000 mg | DELAYED_RELEASE_TABLET | Freq: Every day | ORAL | Status: AC

## 2023-10-18 MED ORDER — METOPROLOL TARTRATE 100 MG PO TABS: ORAL_TABLET | ORAL | 0 refills | Status: DC

## 2023-10-18 NOTE — Patient Instructions (Addendum)
 Medication Instructions:   TAKE: Metoprolol  100mg  1 tablet 2 hours prior to CT scan  START: Aspirin  81mg  1 tablet daily   Lab Work: 3rd Floor   [address]  Your physician recommends that you return for lab work in:   when fasting You need to have labs done when you are fasting.  You can come Monday through Friday 8:00 am to 11:30AM and 1:00 to 4:00. You do not need to make an appointment as the order has already been placed. Lipid, AST, ALT, LPA   Testing/Procedures:  Your cardiac CT will be scheduled at one of the below locations:   [facility] [address] [place], [place] [address]  Please follow these instructions carefully (unless otherwise directed):   On the Night Before the Test: Be sure to Drink plenty of water. Do not consume any caffeinated/decaffeinated beverages or chocolate 12 hours prior to your test. Do not take any antihistamines 12 hours prior to your test.   On the Day of the Test: Drink plenty of water until 1 hour prior to the test. Do not eat any food 4 hours prior to the test. You may take your regular medications prior to the test.  Take metoprolol  (Lopressor ) two hours prior to test. HOLD Furosemide /Hydrochlorothiazide morning of the test. FEMALES- please wear underwire-free bra if available, avoid dresses & tight clothing       After the Test: Drink plenty of water. After receiving IV contrast, you may experience a mild flushed feeling. This is normal. On occasion, you may experience a mild rash up to 24 hours after the test. This is not dangerous. If this occurs, you can take Benadryl 25 mg and increase your fluid intake. If you experience trouble breathing, this can be serious. If it is severe call 911 IMMEDIATELY. If it is mild, please call our office. If you take any of these medications: Glipizide/Metformin , Avandament, Glucavance, please do not take 48 hours after completing test unless otherwise instructed.  We will call to  schedule your test 2-4 weeks out understanding that some insurance companies will need an authorization prior to the service being performed.   For non-scheduling related questions, please contact the cardiac imaging nurse navigator should you have any questions/concerns: Jinger Mount, Cardiac Imaging Nurse Navigator Chase Copping, Cardiac Imaging Nurse Navigator [facility] Direct Office Dial: [phone]   For scheduling needs, including cancellations and rescheduling, please call [place], [phone].    Follow-Up: At [facility], you and your health needs are our priority.  As part of our continuing mission to provide you with exceptional heart care, we have created designated Provider Care Teams.  These Care Teams include your primary Cardiologist (physician) and Advanced Practice Providers (APPs -  Physician Assistants and Nurse Practitioners) who all work together to provide you with the care you need, when you need it.  We recommend signing up for the patient portal called "MyChart".  Sign up information is provided on this After Visit Summary.  MyChart is used to connect with patients for Virtual Visits (Telemedicine).  Patients are able to view lab/test results, encounter notes, upcoming appointments, etc.  Non-urgent messages can be sent to your provider as well.   To learn more about what you can do with MyChart, go to [email or web address].    Your next appointment:   2 month(s)  The format for your next appointment:   In Person  Provider:   Ralene Burger, MD    Other  Instructions NA

## 2023-10-18 NOTE — Progress Notes (Signed)
 Patient given informed consent, signed copy in the chart, time out was performed.  Placed in lithotomy position. Cervix viewed with speculum and colposcope after application of acetic acid.  08/09/2023 HIGH RISK HPV ([place]): Negative ADEQUACY: Satisfactory for evaluation; transformation zone component PRESENT. 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 Clinical COMMENT - CYTOLOGY: correlation is recommended. COMMENT (MOLECULAR): Normal Reference Range HPV - Negative  Colposcopy adequate?  yes Acetowhite lesions?  no Punctation?  no Mosaicism?   no Abnormal vasculature?  no Biopsies?  yes ECC?  yes  Patient was given post procedure instructions.  We will call her or send note via MyChart when results are available.     Misty Nedd L. Bell, M.D., FACOG
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
## 2023-10-20 LAB — SURGICAL PATHOLOGY

## 2023-10-23 ENCOUNTER — Ambulatory Visit: Payer: Medicare (Managed Care) | Admitting: Cardiology

## 2023-10-30 ENCOUNTER — Telehealth (HOSPITAL_COMMUNITY): Payer: Self-pay | Admitting: Emergency Medicine

## 2023-10-30 NOTE — Telephone Encounter (Signed)
 Reaching out to patient to offer assistance regarding upcoming cardiac imaging study; pt verbalizes understanding of appt date/time, parking situation and where to check in, pre-test NPO status and medications ordered, and verified current allergies; name and call back number provided for further questions should they arise Rockwell Alexandria RN Navigator Cardiac Imaging [facility] [phone] office [phone] cell

## 2023-11-01 ENCOUNTER — Telehealth (HOSPITAL_COMMUNITY): Payer: Self-pay | Admitting: *Deleted

## 2023-11-01 DIAGNOSIS — I10 Essential (primary) hypertension: Secondary | ICD-10-CM | POA: Diagnosis not present

## 2023-11-01 DIAGNOSIS — E7800 Pure hypercholesterolemia, unspecified: Secondary | ICD-10-CM | POA: Diagnosis not present

## 2023-11-01 NOTE — Telephone Encounter (Signed)
 Attempted to call patient regarding upcoming cardiac CT appointment. Left message on voicemail with name and callback number  Chase Copping RN Navigator Cardiac Imaging [facility] [phone] Office [phone] Cell  Reminder for blood work prior to her cardiac CT scan.

## 2023-11-02 ENCOUNTER — Ambulatory Visit (HOSPITAL_COMMUNITY)
Admission: RE | Admit: 2023-11-02 | Discharge: 2023-11-02 | Disposition: A | Discharge status: Home / Self Care | Payer: Medicare (Managed Care) | Source: Ambulatory Visit | Attending: Cardiology | Admitting: Cardiology

## 2023-11-02 ENCOUNTER — Other Ambulatory Visit: Payer: Self-pay | Admitting: Cardiology

## 2023-11-02 ENCOUNTER — Telehealth: Payer: Self-pay

## 2023-11-02 ENCOUNTER — Other Ambulatory Visit (HOSPITAL_COMMUNITY): Payer: Medicare (Managed Care)

## 2023-11-02 DIAGNOSIS — R931 Abnormal findings on diagnostic imaging of heart and coronary circulation: Secondary | ICD-10-CM | POA: Insufficient documentation

## 2023-11-02 DIAGNOSIS — I2510 Atherosclerotic heart disease of native coronary artery without angina pectoris: Secondary | ICD-10-CM | POA: Insufficient documentation

## 2023-11-02 DIAGNOSIS — R079 Chest pain, unspecified: Secondary | ICD-10-CM | POA: Diagnosis not present

## 2023-11-02 MED ORDER — NITROGLYCERIN 0.4 MG SL SUBL
SUBLINGUAL_TABLET | SUBLINGUAL | Status: AC
  Filled 2023-11-02: qty 2

## 2023-11-02 MED ORDER — NITROGLYCERIN 0.4 MG SL SUBL
0.8000 mg | SUBLINGUAL_TABLET | Freq: Once | SUBLINGUAL | Status: AC
Start: 2023-11-02 — End: 2023-11-02
  Administered 2023-11-02: 0.8 mg via SUBLINGUAL

## 2023-11-02 MED ORDER — DILTIAZEM HCL 25 MG/5ML IV SOLN: 10.0000 mg | INTRAVENOUS | Status: DC | PRN

## 2023-11-02 MED ORDER — IOHEXOL 350 MG/ML SOLN
100.0000 mL | Freq: Once | INTRAVENOUS | Status: AC | PRN
  Administered 2023-11-02: 100 mL via INTRAVENOUS

## 2023-11-02 MED ORDER — METOPROLOL TARTRATE 5 MG/5ML IV SOLN
10.0000 mg | Freq: Once | INTRAVENOUS | Status: DC | PRN
Start: 2023-11-02 — End: 2023-11-03

## 2023-11-02 NOTE — Telephone Encounter (Signed)
-----   Message from [facility] sent at 11/01/2023 11:34 AM EDT ----- Please call pt. Her cervical path showed Low grade changes.   We need to repeat her PAP in 1 year.   [facility]-S

## 2023-11-02 NOTE — Telephone Encounter (Signed)
 Patient notified of results.  Will schedule repeat pap in one year.   Misty Bell [employer]

## 2023-11-03 ENCOUNTER — Encounter: Payer: Self-pay | Admitting: Obstetrics & Gynecology

## 2023-11-03 LAB — LIPID PANEL
Chol/HDL Ratio: 2.8 ratio (ref 0.0–4.4)
Cholesterol, Total: 200 mg/dL — ABNORMAL HIGH (ref 100–199)
HDL: 71 mg/dL (ref >39)
LDL Chol Calc (NIH): 115 mg/dL — ABNORMAL HIGH (ref 0–99)
Triglycerides: 75 mg/dL (ref 0–149)
VLDL Cholesterol Cal: 14 mg/dL (ref 5–40)

## 2023-11-03 LAB — BASIC METABOLIC PANEL WITH GFR
BUN/Creatinine Ratio: 15 (ref 12–28)
BUN: 9 mg/dL (ref 8–27)
CO2: 24 mmol/L (ref 20–29)
Calcium: 9.6 mg/dL (ref 8.7–10.3)
Chloride: 100 mmol/L (ref 96–106)
Creatinine, Ser: 0.59 mg/dL (ref 0.57–1.00)
Glucose: 98 mg/dL (ref 70–99)
Potassium: 3.8 mmol/L (ref 3.5–5.2)
Sodium: 141 mmol/L (ref 134–144)
eGFR: 100 mL/min/{1.73_m2} (ref >59)

## 2023-11-03 LAB — LIPOPROTEIN A (LPA): Lipoprotein (a): 205.8 nmol/L — ABNORMAL HIGH (ref <75.0)

## 2023-11-03 LAB — AST: AST: 13 IU/L (ref 0–40)

## 2023-11-03 LAB — ALT: ALT: 10 IU/L (ref 0–32)

## 2023-11-07 ENCOUNTER — Ambulatory Visit: Payer: Self-pay

## 2023-11-08 ENCOUNTER — Telehealth: Payer: Self-pay

## 2023-11-08 NOTE — Telephone Encounter (Signed)
 Left VM to return call  Will discuss this during the visit.  Numbers did not show any significant/critical stenosis, so no need to do cardiac catheterization or intervention.

## 2023-11-13 ENCOUNTER — Ambulatory Visit (HOSPITAL_BASED_OUTPATIENT_CLINIC_OR_DEPARTMENT_OTHER)
Admission: RE | Admit: 2023-11-13 | Discharge: 2023-11-13 | Disposition: A | Discharge status: Home / Self Care | Payer: Medicare (Managed Care) | Source: Ambulatory Visit | Attending: Cardiology | Admitting: Cardiology

## 2023-11-13 DIAGNOSIS — R0609 Other forms of dyspnea: Secondary | ICD-10-CM | POA: Insufficient documentation

## 2023-11-14 ENCOUNTER — Ambulatory Visit: Payer: Self-pay | Admitting: Cardiology

## 2023-11-14 LAB — ECHOCARDIOGRAM COMPLETE
AR max vel: 2.23 cm2
AV Area VTI: 2.29 cm2
AV Area mean vel: 2.08 cm2
AV Mean grad: 3 mmHg
AV Peak grad: 5.3 mmHg
Ao pk vel: 1.15 m/s
Area-P 1/2: 4.86 cm2
Calc EF: 60 %
S' Lateral: 2.5 cm
Single Plane A2C EF: 60 %
Single Plane A4C EF: 60.9 %

## 2023-11-17 ENCOUNTER — Telehealth: Payer: Self-pay

## 2023-11-17 DIAGNOSIS — E7800 Pure hypercholesterolemia, unspecified: Secondary | ICD-10-CM

## 2023-11-17 MED ORDER — ROSUVASTATIN CALCIUM 10 MG PO TABS: 10.0000 mg | ORAL_TABLET | Freq: Every day | ORAL | 3 refills | Status: DC

## 2023-11-17 NOTE — Addendum Note (Signed)
 Addended by: Shawnee Dellen D on: 11/17/2023 11:24 AM   Modules accepted: Orders

## 2023-11-17 NOTE — Telephone Encounter (Signed)
 Lab Results reviewed with pt as per Dr. Vanetta Shawl note.  Pt verbalized understanding and had no additional questions. Routed to PCP

## 2023-11-28 ENCOUNTER — Telehealth: Payer: Self-pay

## 2023-11-28 NOTE — Telephone Encounter (Signed)
CT AngioResults reviewed with pt as per Dr. Krasowski's note.  Pt verbalized understanding and had no additional questions. Routed to PCP  

## 2023-12-07 ENCOUNTER — Ambulatory Visit: Payer: Medicare (Managed Care) | Admitting: Cardiology

## 2023-12-07 ENCOUNTER — Telehealth: Payer: Self-pay | Admitting: Cardiology

## 2023-12-07 NOTE — Telephone Encounter (Signed)
 Pt calling in to speak with some about her recent test results. She has some additional questions/ concerns she needs to address.

## 2023-12-07 NOTE — Telephone Encounter (Signed)
 Sent message to front desk to call to reschedule appt to discuss test results

## 2023-12-25 ENCOUNTER — Encounter: Payer: Self-pay | Admitting: Family Medicine

## 2023-12-26 ENCOUNTER — Ambulatory Visit: Payer: Medicare (Managed Care)

## 2023-12-26 VITALS — Ht 62.0 in | Wt 147.0 lb

## 2023-12-26 DIAGNOSIS — Z0000 Encounter for general adult medical examination without abnormal findings: Secondary | ICD-10-CM

## 2023-12-26 NOTE — Patient Instructions (Signed)
 Ms. Misty Bell  , Thank you for taking time out of your busy schedule to complete your Annual Wellness Visit with me. I enjoyed our conversation and look forward to speaking with you again next year. I, as well as your care team,  appreciate your ongoing commitment to your health goals. Please review the following plan we discussed and let me know if I can assist you in the future. Your Game plan/ To Do List    Referrals: If you haven't heard from the office you've been referred to, please reach out to them at the phone provided.   Follow up Visits: Next Medicare AWV with our clinical staff: 12/26/2024 at 11:30 a.m. phone visit with Nurse Health Advisor   Have you seen your provider in the last 6 months (3 months if uncontrolled diabetes)? No Next Office Visit with your provider: Please call office to schedule follow up appointment.  Clinician Recommendations:  Aim for 30 minutes of exercise or brisk walking, 6-8 glasses of water, and 5 servings of fruits and vegetables each day.       This is a list of the screening recommended for you and due dates:  Health Maintenance  Topic Date Due   Yearly kidney health urinalysis for diabetes  Never done   Zoster (Shingles) Vaccine (1 of 2) Never done   Eye exam for diabetics  11/28/2020   COVID-19 Vaccine (7 - 2024-25 season) 10/09/2023   Flu Shot  01/26/2024   Hemoglobin A1C  02/06/2024   Complete foot exam   04/09/2024   Yearly kidney function blood test for diabetes  10/31/2024   Medicare Annual Wellness Visit  12/25/2024   Mammogram  04/16/2025   Colon Cancer Screening  05/25/2025   Pap with HPV screening  08/08/2028   DTaP/Tdap/Td vaccine (2 - Td or Tdap) 02/20/2031   Pneumococcal Vaccine for age over 44  Completed   DEXA scan (bone density measurement)  Completed   Hepatitis C Screening  Completed   HIV Screening  Completed   Hepatitis B Vaccine  Aged Out   HPV Vaccine  Aged Out   Meningitis B Vaccine  Aged Out    Advanced  directives: (Copy Requested) Please bring a copy of your health care power of attorney and living will to the office to be added to your chart at your convenience. You can mail to [facility] [address] [place], [place] [address] or email to [email or web address] Advance Care Planning is important because it:  [x]  Makes sure you receive the medical care that is consistent with your values, goals, and preferences  [x]  It provides guidance to your family and loved ones and reduces their decisional burden about whether or not they are making the right decisions based on your wishes.  Follow the link provided in your after visit summary or read over the paperwork we have mailed to you to help you started getting your Advance Directives in place. If you need assistance in completing these, please reach out to us  so that we can help you!  See attachments for Preventive Care and Fall Prevention Tips.
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
## 2024-01-03 DIAGNOSIS — E119 Type 2 diabetes mellitus without complications: Secondary | ICD-10-CM | POA: Insufficient documentation

## 2024-01-03 DIAGNOSIS — Z972 Presence of dental prosthetic device (complete) (partial): Secondary | ICD-10-CM | POA: Insufficient documentation

## 2024-01-03 DIAGNOSIS — Z973 Presence of spectacles and contact lenses: Secondary | ICD-10-CM | POA: Insufficient documentation

## 2024-01-03 DIAGNOSIS — R0683 Snoring: Secondary | ICD-10-CM | POA: Insufficient documentation

## 2024-01-03 DIAGNOSIS — E785 Hyperlipidemia, unspecified: Secondary | ICD-10-CM | POA: Insufficient documentation

## 2024-01-03 DIAGNOSIS — F419 Anxiety disorder, unspecified: Secondary | ICD-10-CM | POA: Insufficient documentation

## 2024-01-03 DIAGNOSIS — F32A Depression, unspecified: Secondary | ICD-10-CM | POA: Insufficient documentation

## 2024-01-03 DIAGNOSIS — M25569 Pain in unspecified knee: Secondary | ICD-10-CM | POA: Insufficient documentation

## 2024-01-03 DIAGNOSIS — M170 Bilateral primary osteoarthritis of knee: Secondary | ICD-10-CM | POA: Insufficient documentation

## 2024-01-04 ENCOUNTER — Encounter: Payer: Self-pay | Admitting: Cardiology

## 2024-01-04 ENCOUNTER — Ambulatory Visit: Payer: Medicare (Managed Care) | Attending: Cardiology | Admitting: Cardiology

## 2024-01-04 VITALS — BP 132/78 | HR 88 | Ht 62.0 in | Wt 147.0 lb

## 2024-01-04 DIAGNOSIS — I2510 Atherosclerotic heart disease of native coronary artery without angina pectoris: Secondary | ICD-10-CM | POA: Diagnosis not present

## 2024-01-04 DIAGNOSIS — I10 Essential (primary) hypertension: Secondary | ICD-10-CM | POA: Diagnosis not present

## 2024-01-04 DIAGNOSIS — E1169 Type 2 diabetes mellitus with other specified complication: Secondary | ICD-10-CM

## 2024-01-04 DIAGNOSIS — E669 Obesity, unspecified: Secondary | ICD-10-CM
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
## 2024-01-04 NOTE — Patient Instructions (Addendum)
 Medication Instructions:  Your physician recommends that you continue on your current medications as directed. Please refer to the Current Medication list given to you today.  *If you need a refill on your cardiac medications before your next appointment, please call your pharmacy*   Lab Work: Lipid panel, AST, ALT You need to have labs done when you are fasting.     Testing/Procedures: None Ordered   Follow-Up: At [facility], you and your health needs are our priority.  As part of our continuing mission to provide you with exceptional heart care, we have created designated Provider Care Teams.  These Care Teams include your primary Cardiologist (physician) and Advanced Practice Providers (APPs -  Physician Assistants and Nurse Practitioners) who all work together to provide you with the care you need, when you need it.  We recommend signing up for the patient portal called MyChart.  Sign up information is provided on this After Visit Summary.  MyChart is used to connect with patients for Virtual Visits (Telemedicine).  Patients are able to view lab/test results, encounter notes, upcoming appointments, etc.  Non-urgent messages can be sent to your provider as well.   To learn more about what you can do with MyChart, go to [email or web address].    Your next appointment:   5 month(s)  The format for your next appointment:   In Person  Provider:   Lamar Fitch, MD    Other Instructions NA

## 2024-01-16 LAB — ALT: ALT: 14 IU/L (ref 0–32)

## 2024-01-16 LAB — LIPID PANEL
Chol/HDL Ratio: 2.1 ratio (ref 0.0–4.4)
Cholesterol, Total: 168 mg/dL (ref 100–199)
HDL: 80 mg/dL (ref >39)
LDL Chol Calc (NIH): 76 mg/dL (ref 0–99)
Triglycerides: 60 mg/dL (ref 0–149)
VLDL Cholesterol Cal: 12 mg/dL (ref 5–40)

## 2024-01-16 LAB — AST: AST: 15 IU/L (ref 0–40)

## 2024-01-18 ENCOUNTER — Ambulatory Visit: Payer: Self-pay | Admitting: Cardiology

## 2024-01-22 ENCOUNTER — Encounter: Payer: Self-pay | Admitting: Family Medicine

## 2024-01-23 ENCOUNTER — Ambulatory Visit: Payer: Medicare (Managed Care) | Admitting: Family Medicine

## 2024-01-23 ENCOUNTER — Ambulatory Visit: Payer: Medicare (Managed Care) | Admitting: Physician Assistant

## 2024-01-23 ENCOUNTER — Ambulatory Visit: Payer: Self-pay

## 2024-01-23 ENCOUNTER — Encounter: Payer: Self-pay | Admitting: Physician Assistant

## 2024-01-23 ENCOUNTER — Ambulatory Visit (INDEPENDENT_AMBULATORY_CARE_PROVIDER_SITE_OTHER): Payer: Medicare (Managed Care) | Admitting: Physician Assistant

## 2024-01-23 ENCOUNTER — Other Ambulatory Visit (HOSPITAL_BASED_OUTPATIENT_CLINIC_OR_DEPARTMENT_OTHER): Payer: Self-pay

## 2024-01-23 ENCOUNTER — Telehealth: Payer: Self-pay

## 2024-01-23 VITALS — BP 120/75 | HR 97 | Temp 98.1°F | Ht 62.0 in | Wt 147.6 lb

## 2024-01-23 DIAGNOSIS — K047 Periapical abscess without sinus: Secondary | ICD-10-CM | POA: Diagnosis not present

## 2024-01-23 MED ORDER — AMOXICILLIN-POT CLAVULANATE 875-125 MG PO TABS
1.0000 | ORAL_TABLET | Freq: Two times a day (BID) | ORAL | 0 refills | Status: AC
  Filled 2024-01-23: qty 14, 7d supply, fill #0

## 2024-01-23 NOTE — Telephone Encounter (Signed)
 FYI Only or Action Required?: FYI only for provider.  Patient was last seen in primary care on 08/09/2023 by Copland, Harlene BROCKS, MD.  Called Nurse Triage reporting Sore Throat.  Symptoms began several days ago.  Interventions attempted: Rest, hydration, or home remedies.  Symptoms are: unchanged.  Triage Disposition: Information or Advice Only Call  Patient/caregiver understands and will follow disposition?: Yes   **Patient scheduled for 7/29 at 4:00pm as she missed her earlier appointment today**                                   Copied from CRM #[id number]. Topic: Clinical - Red Word Triage >> Jan 23, 2024  9:52 AM Misty Bell wrote: Red Word that prompted transfer to Nurse Triage: Extreme pain in throat: Patient said she was experiencing extreme pain: Patient states she had a sore throat, then sore in her gums in the right side of her mouth-not throat. Reason for Disposition  Health information question, no triage required and triager able to answer question  Answer Assessment - Initial Assessment Questions 1. REASON FOR CALL: What is the main reason for your call? or How can I best help you?   Patient was calling as she missed her appointment for today at 9:15 am, she was rescheduled for today 7/29, at 4:00 pm for the sore throat.  Protocols used: Information Only Call - No Triage-A-AH

## 2024-01-23 NOTE — Progress Notes (Signed)
 Established patient visit   Patient: Misty Bell    DOB: 08/22/1955   68 y.o. Female  MRN: [id number] Visit Date: 01/23/2024  Today's healthcare provider: Manuelita Flatness, PA-C   Cc. Sore throat, swelling  Subjective     Discussed the use of AI scribe software for clinical note transcription with the patient, who gave verbal consent to proceed.  History of Present Illness   Misty Bell  is a 68 year old female who presents with pain and swelling in the mouth and difficulty swallowing.  She experiences pain in the lower part of her mouth, where her dental partial is located, which began early Saturday morning and has progressively worsened. The pain is severe enough to limit her ability to open her mouth and is exacerbated by swallowing. Swelling is present, particularly in the lymph nodes, and has worsened since onset. She has some difficulty swallowing, which is somewhat alleviated by Ambroxol and Goody Powder. She manages the pain with soup, tea, and salt water gargles. She frequently spits to avoid swallowing.       Medications: Outpatient Medications Prior to Visit  Medication Sig   amLODipine  (NORVASC ) 10 MG tablet TAKE 1 TABLET(10 MG) BY MOUTH DAILY   aspirin  EC 81 MG tablet Take 1 tablet (81 mg total) by mouth daily. Swallow whole.   Blood Glucose Monitoring Suppl DEVI 1 each by Does not apply route in the morning, at noon, and at bedtime. May substitute to any manufacturer covered by patient's insurance.   calcium  citrate-vitamin D  (CITRACAL+D) 315-200 MG-UNIT tablet Take 1 tablet by mouth 2 (two) times daily.   Continuous Glucose Receiver (DEXCOM G7 RECEIVER) DEVI Use as directed   Continuous Glucose Sensor (FREESTYLE LIBRE 3 SENSOR) MISC Place 1 sensor on the skin every 14 days. Use to check glucose continuously.   Continuous Glucose Sensor (FREESTYLE LIBRE 3 SENSOR) MISC 1 each by Does not apply route every 14 (fourteen) days. Place 1 sensor on the skin  every 14 days. Use to check glucose continuously   furosemide  (LASIX ) 20 MG tablet Take 1 tablet (20 mg total) by mouth daily.   glucose blood (ONETOUCH VERIO) test strip Check blood sugars 3 times daily   Insulin  Pen Needle (PEN NEEDLES) 31G X 5 MM MISC 1 each by Does not apply route daily.   Lancets (ONETOUCH DELICA PLUS LANCET33G) MISC Check blood sugars 3 times daily   metFORMIN  (GLUCOPHAGE ) 500 MG tablet TAKE 1 TABLET BY MOUTH EVERY DAY WITH BREAKFAST   Multiple Vitamins-Minerals (CELEBRATE MULTI-COMPLETE 18) CHEW Chew 1 tablet by mouth daily.   rosuvastatin  (CRESTOR ) 10 MG tablet Take 1 tablet (10 mg total) by mouth daily.   tirzepatide  (MOUNJARO ) 7.5 MG/0.5ML Pen Inject 7.5 mg into the skin once a week.   No facility-administered medications prior to visit.    Review of Systems  Constitutional:  Negative for fatigue and fever.  HENT:  Positive for sore throat and trouble swallowing.   Respiratory:  Negative for cough and shortness of breath.   Cardiovascular:  Negative for chest pain and leg swelling.  Gastrointestinal:  Negative for abdominal pain.  Neurological:  Negative for dizziness and headaches.       Objective    BP 120/75   Pulse 97   Temp 98.1 F (36.7 C)   Ht 5' 2 (1.575 m)   Wt 147 lb 9.6 oz (67 kg)   BMI 27.00 kg/m    Physical Exam Vitals reviewed.  Constitutional:  Appearance: She is not ill-appearing.     Comments: Pt sounds hoarse/muffled  HENT:     Head: Normocephalic.     Mouth/Throat:     Pharynx: Posterior oropharyngeal erythema present.     Comments: Lower right dentition without visible caries, some erythema along inner gum line  Mild edema right tonsil Eyes:     Conjunctiva/sclera: Conjunctivae normal.  Neck:     Comments: Bilateral soft tender enlarged tonsillar adenopathy Cardiovascular:     Rate and Rhythm: Normal rate.  Pulmonary:     Effort: Pulmonary effort is normal. No respiratory distress.  Lymphadenopathy:      Cervical: Cervical adenopathy present.  Neurological:     Mental Status: She is alert and oriented to person, place, and time.  Psychiatric:        Mood and Affect: Mood normal.        Behavior: Behavior normal.     No results found for any visits on 01/23/24.  Assessment & Plan    Dental infection -     Amoxicillin -Pot Clavulanate; Take 1 tablet by mouth 2 (two) times daily for 7 days.  Dispense: 14 tablet; Refill: 0  oropharyngeal infection Unclear to etiology some erythema where pt's pain is inside her mouth but no visible wound.  Differential diagnosis includes abscess, parotitis,  periodontitis. Obvious associated adenopathy  Rx augmentin  bid x 7 days. Encouraged pt to contact dentist - Advise warm compresses to the affected area. - Recommend throat lozenges, hot liquids, and salt water gargles for symptomatic relief.  Cautioned if symptoms progress-- if she is unable to swallow saliva or food/drink, recommend ED for w/u, may need neck CT. She reports today she is able to swallow it is just painful .  Return if symptoms worsen or fail to improve.       Manuelita Flatness, PA-C  [facility] at [facility] [phone] (phone) [phone] (fax)  [facility]

## 2024-01-23 NOTE — Telephone Encounter (Signed)
 Left message on My Chart with lab results per Dr. Vanetta Shawl note. Routed to PCP.

## 2024-02-01 ENCOUNTER — Other Ambulatory Visit: Payer: Self-pay | Admitting: Family Medicine

## 2024-02-01 DIAGNOSIS — E119 Type 2 diabetes mellitus without complications: Secondary | ICD-10-CM

## 2024-02-12 ENCOUNTER — Other Ambulatory Visit: Payer: Self-pay | Admitting: Family Medicine

## 2024-02-12 DIAGNOSIS — E1165 Type 2 diabetes mellitus with hyperglycemia: Secondary | ICD-10-CM

## 2024-02-12 NOTE — Telephone Encounter (Unsigned)
 Copied from CRM #[id number]. Topic: Clinical - Medication Refill >> Feb 12, 2024  1:53 PM Franky GRADE wrote: Medication: tirzepatide  (MOUNJARO ) 7.5 MG/0.5ML Pen [[id number]]  Has the patient contacted their pharmacy? No, Sherleen is calling to place the request for a 90 day supply.  (Agent: If no, request that the patient contact the pharmacy for the refill. If patient does not wish to contact the pharmacy document the reason why and proceed with request.) (Agent: If yes, when and what did the pharmacy advise?)  This is the patient's preferred pharmacy:  [facility] [phone] - [place],  - [address] AT [facility] [address] [place] [address] Phone: [phone] Fax: [phone]    Is this the correct pharmacy for this prescription? Yes If no, delete pharmacy and type the correct one.   Has the prescription been filled recently? No  Is the patient out of the medication? No  Has the patient been seen for an appointment in the last year OR does the patient have an upcoming appointment? Yes  Can we respond through MyChart? Yes  Agent: Please be advised that Rx refills may take up to 3 business days. We ask that you follow-up with your pharmacy.

## 2024-02-13 MED ORDER — TIRZEPATIDE 7.5 MG/0.5ML ~~LOC~~ SOAJ: 7.5000 mg | SUBCUTANEOUS | 0 refills | Status: DC
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
## 2024-02-14 NOTE — Patient Instructions (Incomplete)
 It was good to see you today, I will be in touch with your A1c  As well please see me in about 6 months  Recommend a flu shot and COVID booster this fall.  Also please be sure you get that second shingles vaccine if not done already  Let's continue Mounjaro  7.5mg . if you feel like you are more hungry towards the end of the shot cycle- you may need more to eat:) also try eating more protein  Let's drop the furosemide  now Will change your amlodipine  to the 5mg  strength

## 2024-02-15 ENCOUNTER — Encounter: Payer: Self-pay | Admitting: Family Medicine

## 2024-02-15 ENCOUNTER — Other Ambulatory Visit (HOSPITAL_BASED_OUTPATIENT_CLINIC_OR_DEPARTMENT_OTHER): Payer: Self-pay

## 2024-02-15 ENCOUNTER — Ambulatory Visit (INDEPENDENT_AMBULATORY_CARE_PROVIDER_SITE_OTHER): Payer: Medicare (Managed Care) | Admitting: Family Medicine

## 2024-02-15 VITALS — BP 122/78 | HR 76 | Ht 62.0 in | Wt 146.0 lb

## 2024-02-15 DIAGNOSIS — E1165 Type 2 diabetes mellitus with hyperglycemia: Secondary | ICD-10-CM

## 2024-02-15 DIAGNOSIS — E782 Mixed hyperlipidemia: Secondary | ICD-10-CM | POA: Diagnosis not present

## 2024-02-15 DIAGNOSIS — E119 Type 2 diabetes mellitus without complications: Secondary | ICD-10-CM

## 2024-02-15 DIAGNOSIS — Z7985 Long-term (current) use of injectable non-insulin antidiabetic drugs: Secondary | ICD-10-CM

## 2024-02-15 DIAGNOSIS — E669 Obesity, unspecified: Secondary | ICD-10-CM

## 2024-02-15 DIAGNOSIS — E1169 Type 2 diabetes mellitus with other specified complication: Secondary | ICD-10-CM

## 2024-02-15 DIAGNOSIS — I10 Essential (primary) hypertension: Secondary | ICD-10-CM

## 2024-02-15 MED ORDER — AMLODIPINE BESYLATE 5 MG PO TABS
5.0000 mg | ORAL_TABLET | Freq: Every day | ORAL | 3 refills | Status: AC
  Filled 2024-02-15: qty 90, 90d supply, fill #0

## 2024-02-15 MED ORDER — TIRZEPATIDE 7.5 MG/0.5ML ~~LOC~~ SOAJ
7.5000 mg | SUBCUTANEOUS | 2 refills | Status: DC
  Filled 2024-02-15 (×2): qty 6, 84d supply, fill #0

## 2024-02-16 ENCOUNTER — Encounter: Payer: Self-pay | Admitting: Family Medicine

## 2024-02-16 LAB — CBC
HCT: 43 % (ref 36.0–46.0)
Hemoglobin: 14.1 g/dL (ref 12.0–15.0)
MCHC: 32.9 g/dL (ref 30.0–36.0)
MCV: 90.1 fl (ref 78.0–100.0)
Platelets: 331 K/uL (ref 150.0–400.0)
RBC: 4.78 Mil/uL (ref 3.87–5.11)
RDW: 14.8 % (ref 11.5–15.5)
WBC: 8.7 K/uL (ref 4.0–10.5)

## 2024-02-16 LAB — HEMOGLOBIN A1C: Hgb A1c MFr Bld: 6 % (ref 4.6–6.5)

## 2024-02-16 LAB — MICROALBUMIN / CREATININE URINE RATIO
Creatinine,U: 233.9 mg/dL
Microalb Creat Ratio: 6 mg/g (ref 0.0–30.0)
Microalb, Ur: 1.4 mg/dL (ref 0.0–1.9)

## 2024-03-17 ENCOUNTER — Encounter: Payer: Self-pay | Admitting: Family Medicine

## 2024-03-17 MED ORDER — TIRZEPATIDE 5 MG/0.5ML ~~LOC~~ SOAJ: 5.0000 mg | SUBCUTANEOUS | 2 refills | Status: DC

## 2024-03-21 ENCOUNTER — Encounter: Payer: Self-pay | Admitting: Family Medicine

## 2024-04-03 ENCOUNTER — Other Ambulatory Visit: Payer: Self-pay | Admitting: Family Medicine

## 2024-04-03 DIAGNOSIS — R600 Localized edema: Secondary | ICD-10-CM

## 2024-05-31 ENCOUNTER — Other Ambulatory Visit: Payer: Self-pay | Admitting: Family Medicine

## 2024-05-31 DIAGNOSIS — E119 Type 2 diabetes mellitus without complications: Secondary | ICD-10-CM

## 2024-06-03 ENCOUNTER — Encounter: Payer: Self-pay | Admitting: Family Medicine

## 2024-06-03 DIAGNOSIS — R600 Localized edema: Secondary | ICD-10-CM

## 2024-06-03 DIAGNOSIS — E119 Type 2 diabetes mellitus without complications: Secondary | ICD-10-CM

## 2024-06-03 NOTE — Telephone Encounter (Signed)
 Needs appt to discuss

## 2024-06-04 MED ORDER — TIRZEPATIDE 2.5 MG/0.5ML ~~LOC~~ SOAJ: 2.5000 mg | SUBCUTANEOUS | 3 refills | Status: DC

## 2024-06-04 MED ORDER — FUROSEMIDE 20 MG PO TABS: ORAL_TABLET | ORAL | 3 refills | Status: AC

## 2024-06-10 ENCOUNTER — Encounter: Payer: Self-pay | Admitting: Cardiology

## 2024-06-10 ENCOUNTER — Other Ambulatory Visit (HOSPITAL_BASED_OUTPATIENT_CLINIC_OR_DEPARTMENT_OTHER): Payer: Self-pay | Admitting: Family Medicine

## 2024-06-10 ENCOUNTER — Ambulatory Visit: Payer: Medicare (Managed Care) | Attending: Cardiology | Admitting: Cardiology

## 2024-06-10 VITALS — BP 124/76 | HR 95 | Ht 62.0 in | Wt 148.0 lb

## 2024-06-10 DIAGNOSIS — Z1231 Encounter for screening mammogram for malignant neoplasm of breast: Secondary | ICD-10-CM

## 2024-06-10 DIAGNOSIS — Z8249 Family history of ischemic heart disease and other diseases of the circulatory system: Secondary | ICD-10-CM

## 2024-06-10 DIAGNOSIS — R0609 Other forms of dyspnea: Secondary | ICD-10-CM

## 2024-06-10 DIAGNOSIS — E782 Mixed hyperlipidemia: Secondary | ICD-10-CM | POA: Diagnosis not present

## 2024-06-10 DIAGNOSIS — I2510 Atherosclerotic heart disease of native coronary artery without angina pectoris: Secondary | ICD-10-CM

## 2024-06-10 MED ORDER — ROSUVASTATIN CALCIUM 20 MG PO TABS: 20.0000 mg | ORAL_TABLET | Freq: Every day | ORAL | 3 refills | Status: DC

## 2024-06-10 NOTE — Patient Instructions (Addendum)
 Medication Instructions:   INCREASE: Crestor  to 20mg  1 tablet daily   Lab Work: 3rd Floor   [address]  Your physician recommends that you return for lab work in:   6 weeks You need to have labs done when you are fasting.  You can come Monday through Friday 8:00 am to 11:30AM and 1:00 to 4:00. You do not need to make an appointment as the order has already been placed.   Testing/Procedures: None Ordered   Follow-Up: At [facility], you and your health needs are our priority.  As part of our continuing mission to provide you with exceptional heart care, we have created designated Provider Care Teams.  These Care Teams include your primary Cardiologist (physician) and Advanced Practice Providers (APPs -  Physician Assistants and Nurse Practitioners) who all work together to provide you with the care you need, when you need it.  We recommend signing up for the patient portal called MyChart.  Sign up information is provided on this After Visit Summary.  MyChart is used to connect with patients for Virtual Visits (Telemedicine).  Patients are able to view lab/test results, encounter notes, upcoming appointments, etc.  Non-urgent messages can be sent to your provider as well.   To learn more about what you can do with MyChart, go to [email or web address].    Your next appointment:   6 month(s)  The format for your next appointment:   In Person  Provider:   Lamar Fitch, MD    Other Instructions NA
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
## 2024-06-13 ENCOUNTER — Encounter (HOSPITAL_BASED_OUTPATIENT_CLINIC_OR_DEPARTMENT_OTHER): Payer: Self-pay

## 2024-06-13 ENCOUNTER — Inpatient Hospital Stay (HOSPITAL_BASED_OUTPATIENT_CLINIC_OR_DEPARTMENT_OTHER)
Admission: RE | Admit: 2024-06-13 | Discharge: 2024-06-13 | Payer: Medicare (Managed Care) | Attending: Family Medicine | Admitting: Family Medicine

## 2024-06-13 DIAGNOSIS — Z1231 Encounter for screening mammogram for malignant neoplasm of breast: Secondary | ICD-10-CM | POA: Diagnosis not present

## 2024-07-16 LAB — LIPID PANEL
Chol/HDL Ratio: 2.1 ratio (ref 0.0–4.4)
Cholesterol, Total: 197 mg/dL (ref 100–199)
HDL: 94 mg/dL
LDL Chol Calc (NIH): 92 mg/dL (ref 0–99)
Triglycerides: 59 mg/dL (ref 0–149)
VLDL Cholesterol Cal: 11 mg/dL (ref 5–40)

## 2024-07-16 LAB — AST: AST: 20 IU/L (ref 0–40)

## 2024-07-16 LAB — ALT: ALT: 19 IU/L (ref 0–32)

## 2024-07-23 ENCOUNTER — Ambulatory Visit: Payer: Self-pay | Admitting: Cardiology

## 2024-07-23 DIAGNOSIS — E782 Mixed hyperlipidemia: Secondary | ICD-10-CM

## 2024-07-23 MED ORDER — ROSUVASTATIN CALCIUM 40 MG PO TABS: 40.0000 mg | ORAL_TABLET | Freq: Every day | ORAL | 3 refills | Status: AC

## 2024-07-24 ENCOUNTER — Telehealth: Payer: Self-pay

## 2024-07-24 NOTE — Telephone Encounter (Signed)
 Lab Results reviewed with pt as per Dr. Karry note.  Pt verbalized understanding and had no additional questions. Routed to PCP

## 2024-07-26 ENCOUNTER — Encounter: Payer: Self-pay | Admitting: Family Medicine

## 2024-07-26 MED ORDER — TIRZEPATIDE 5 MG/0.5ML ~~LOC~~ SOAJ: 5.0000 mg | SUBCUTANEOUS | 1 refills | Status: AC

## 2024-12-26 ENCOUNTER — Ambulatory Visit: Payer: Medicare (Managed Care)
